# Patient Record
Sex: Female | Born: 1945
Health system: Southern US, Community
[De-identification: ages and names within clinical notes are randomized; demographics above are authoritative.]

## PROBLEM LIST (undated history)

## (undated) DIAGNOSIS — E079 Disorder of thyroid, unspecified: Secondary | ICD-10-CM

## (undated) DIAGNOSIS — I10 Essential (primary) hypertension: Secondary | ICD-10-CM

## (undated) DIAGNOSIS — J309 Allergic rhinitis, unspecified: Secondary | ICD-10-CM

## (undated) DIAGNOSIS — K219 Gastro-esophageal reflux disease without esophagitis: Secondary | ICD-10-CM

## (undated) DIAGNOSIS — M1990 Unspecified osteoarthritis, unspecified site: Secondary | ICD-10-CM

## (undated) DIAGNOSIS — K449 Diaphragmatic hernia without obstruction or gangrene: Secondary | ICD-10-CM

## (undated) DIAGNOSIS — M810 Age-related osteoporosis without current pathological fracture: Secondary | ICD-10-CM

## (undated) DIAGNOSIS — D219 Benign neoplasm of connective and other soft tissue, unspecified: Secondary | ICD-10-CM

## (undated) DIAGNOSIS — I447 Left bundle-branch block, unspecified: Secondary | ICD-10-CM

## (undated) DIAGNOSIS — E559 Vitamin D deficiency, unspecified: Secondary | ICD-10-CM

## (undated) DIAGNOSIS — R7303 Prediabetes: Secondary | ICD-10-CM

## (undated) DIAGNOSIS — E7800 Pure hypercholesterolemia, unspecified: Secondary | ICD-10-CM

## (undated) HISTORY — DX: Allergic rhinitis, unspecified: J30.9

## (undated) HISTORY — DX: Vitamin D deficiency, unspecified: E55.9

## (undated) HISTORY — DX: Disorder of thyroid, unspecified: E07.9

## (undated) HISTORY — PX: FRACTURE SURGERY: SHX138

## (undated) HISTORY — DX: Age-related osteoporosis without current pathological fracture: M81.0

## (undated) HISTORY — PX: APPENDECTOMY: SHX54

---

## 2015-05-28 DIAGNOSIS — E119 Type 2 diabetes mellitus without complications: Secondary | ICD-10-CM

## 2015-05-28 HISTORY — PX: COLONOSCOPY: SHX174

## 2015-05-28 HISTORY — PX: WRIST FRACTURE SURGERY: SHX121

## 2015-05-28 HISTORY — DX: Type 2 diabetes mellitus without complications: E11.9

## 2015-05-31 DIAGNOSIS — Z01419 Encounter for gynecological examination (general) (routine) without abnormal findings: Secondary | ICD-10-CM | POA: Diagnosis not present

## 2015-05-31 DIAGNOSIS — Z23 Encounter for immunization: Secondary | ICD-10-CM | POA: Diagnosis not present

## 2015-05-31 DIAGNOSIS — Z1151 Encounter for screening for human papillomavirus (HPV): Secondary | ICD-10-CM | POA: Diagnosis not present

## 2015-05-31 DIAGNOSIS — Z0001 Encounter for general adult medical examination with abnormal findings: Secondary | ICD-10-CM | POA: Diagnosis not present

## 2015-05-31 DIAGNOSIS — E785 Hyperlipidemia, unspecified: Secondary | ICD-10-CM | POA: Diagnosis not present

## 2015-05-31 DIAGNOSIS — R7309 Other abnormal glucose: Secondary | ICD-10-CM | POA: Diagnosis not present

## 2015-05-31 DIAGNOSIS — I10 Essential (primary) hypertension: Secondary | ICD-10-CM | POA: Diagnosis not present

## 2015-06-06 DIAGNOSIS — S59201A Unspecified physeal fracture of lower end of radius, right arm, initial encounter for closed fracture: Secondary | ICD-10-CM | POA: Diagnosis not present

## 2015-06-06 DIAGNOSIS — S61511A Laceration without foreign body of right wrist, initial encounter: Secondary | ICD-10-CM | POA: Diagnosis not present

## 2015-06-06 DIAGNOSIS — F17200 Nicotine dependence, unspecified, uncomplicated: Secondary | ICD-10-CM | POA: Diagnosis not present

## 2015-06-06 DIAGNOSIS — S52501A Unspecified fracture of the lower end of right radius, initial encounter for closed fracture: Secondary | ICD-10-CM | POA: Diagnosis not present

## 2015-06-06 DIAGNOSIS — S52691A Other fracture of lower end of right ulna, initial encounter for closed fracture: Secondary | ICD-10-CM | POA: Diagnosis not present

## 2015-06-06 DIAGNOSIS — S59001A Unspecified physeal fracture of lower end of ulna, right arm, initial encounter for closed fracture: Secondary | ICD-10-CM | POA: Diagnosis not present

## 2015-06-06 DIAGNOSIS — Z23 Encounter for immunization: Secondary | ICD-10-CM | POA: Diagnosis not present

## 2015-06-06 DIAGNOSIS — E559 Vitamin D deficiency, unspecified: Secondary | ICD-10-CM | POA: Diagnosis not present

## 2015-06-06 DIAGNOSIS — E785 Hyperlipidemia, unspecified: Secondary | ICD-10-CM | POA: Diagnosis not present

## 2015-06-06 DIAGNOSIS — I10 Essential (primary) hypertension: Secondary | ICD-10-CM | POA: Diagnosis not present

## 2015-06-06 DIAGNOSIS — S52591A Other fractures of lower end of right radius, initial encounter for closed fracture: Secondary | ICD-10-CM | POA: Diagnosis not present

## 2015-06-06 DIAGNOSIS — M79641 Pain in right hand: Secondary | ICD-10-CM | POA: Diagnosis not present

## 2015-06-08 DIAGNOSIS — S6991XA Unspecified injury of right wrist, hand and finger(s), initial encounter: Secondary | ICD-10-CM | POA: Diagnosis not present

## 2015-06-13 DIAGNOSIS — Z91040 Latex allergy status: Secondary | ICD-10-CM | POA: Diagnosis not present

## 2015-06-13 DIAGNOSIS — S52571A Other intraarticular fracture of lower end of right radius, initial encounter for closed fracture: Secondary | ICD-10-CM | POA: Diagnosis not present

## 2015-06-13 DIAGNOSIS — S52531A Colles' fracture of right radius, initial encounter for closed fracture: Secondary | ICD-10-CM | POA: Diagnosis not present

## 2015-06-13 DIAGNOSIS — Z6830 Body mass index (BMI) 30.0-30.9, adult: Secondary | ICD-10-CM | POA: Diagnosis not present

## 2015-06-13 DIAGNOSIS — E079 Disorder of thyroid, unspecified: Secondary | ICD-10-CM | POA: Diagnosis not present

## 2015-06-13 DIAGNOSIS — I10 Essential (primary) hypertension: Secondary | ICD-10-CM | POA: Diagnosis not present

## 2015-06-13 DIAGNOSIS — Z79899 Other long term (current) drug therapy: Secondary | ICD-10-CM | POA: Diagnosis not present

## 2015-06-13 DIAGNOSIS — S52261A Displaced segmental fracture of shaft of ulna, right arm, initial encounter for closed fracture: Secondary | ICD-10-CM | POA: Diagnosis not present

## 2015-06-13 DIAGNOSIS — M85831 Other specified disorders of bone density and structure, right forearm: Secondary | ICD-10-CM | POA: Diagnosis not present

## 2015-06-13 DIAGNOSIS — S52501A Unspecified fracture of the lower end of right radius, initial encounter for closed fracture: Secondary | ICD-10-CM | POA: Diagnosis not present

## 2015-06-13 DIAGNOSIS — F17200 Nicotine dependence, unspecified, uncomplicated: Secondary | ICD-10-CM | POA: Diagnosis not present

## 2015-06-13 DIAGNOSIS — E669 Obesity, unspecified: Secondary | ICD-10-CM | POA: Diagnosis not present

## 2015-06-13 DIAGNOSIS — S52601A Unspecified fracture of lower end of right ulna, initial encounter for closed fracture: Secondary | ICD-10-CM | POA: Diagnosis not present

## 2015-06-13 DIAGNOSIS — S52531D Colles' fracture of right radius, subsequent encounter for closed fracture with routine healing: Secondary | ICD-10-CM | POA: Insufficient documentation

## 2015-06-13 DIAGNOSIS — G8918 Other acute postprocedural pain: Secondary | ICD-10-CM | POA: Diagnosis not present

## 2015-06-27 DIAGNOSIS — M25531 Pain in right wrist: Secondary | ICD-10-CM | POA: Diagnosis not present

## 2015-06-27 DIAGNOSIS — S62101S Fracture of unspecified carpal bone, right wrist, sequela: Secondary | ICD-10-CM | POA: Diagnosis not present

## 2015-06-27 DIAGNOSIS — S6991XA Unspecified injury of right wrist, hand and finger(s), initial encounter: Secondary | ICD-10-CM | POA: Diagnosis not present

## 2015-07-11 DIAGNOSIS — S6991XA Unspecified injury of right wrist, hand and finger(s), initial encounter: Secondary | ICD-10-CM | POA: Diagnosis not present

## 2015-07-11 DIAGNOSIS — M25531 Pain in right wrist: Secondary | ICD-10-CM | POA: Diagnosis not present

## 2015-07-11 DIAGNOSIS — S62101S Fracture of unspecified carpal bone, right wrist, sequela: Secondary | ICD-10-CM | POA: Diagnosis not present

## 2015-07-18 DIAGNOSIS — S62101S Fracture of unspecified carpal bone, right wrist, sequela: Secondary | ICD-10-CM | POA: Diagnosis not present

## 2015-07-18 DIAGNOSIS — M25531 Pain in right wrist: Secondary | ICD-10-CM | POA: Diagnosis not present

## 2015-07-18 DIAGNOSIS — S6991XA Unspecified injury of right wrist, hand and finger(s), initial encounter: Secondary | ICD-10-CM | POA: Diagnosis not present

## 2015-07-25 DIAGNOSIS — M25531 Pain in right wrist: Secondary | ICD-10-CM | POA: Diagnosis not present

## 2015-07-25 DIAGNOSIS — S62101D Fracture of unspecified carpal bone, right wrist, subsequent encounter for fracture with routine healing: Secondary | ICD-10-CM | POA: Diagnosis not present

## 2015-07-25 DIAGNOSIS — S6991XA Unspecified injury of right wrist, hand and finger(s), initial encounter: Secondary | ICD-10-CM | POA: Diagnosis not present

## 2015-08-03 DIAGNOSIS — S6991XA Unspecified injury of right wrist, hand and finger(s), initial encounter: Secondary | ICD-10-CM | POA: Diagnosis not present

## 2015-08-03 DIAGNOSIS — S62101S Fracture of unspecified carpal bone, right wrist, sequela: Secondary | ICD-10-CM | POA: Diagnosis not present

## 2015-08-03 DIAGNOSIS — M25531 Pain in right wrist: Secondary | ICD-10-CM | POA: Diagnosis not present

## 2015-08-22 DIAGNOSIS — S62101S Fracture of unspecified carpal bone, right wrist, sequela: Secondary | ICD-10-CM | POA: Diagnosis not present

## 2015-08-22 DIAGNOSIS — M25531 Pain in right wrist: Secondary | ICD-10-CM | POA: Diagnosis not present

## 2015-08-22 DIAGNOSIS — S6991XA Unspecified injury of right wrist, hand and finger(s), initial encounter: Secondary | ICD-10-CM | POA: Diagnosis not present

## 2015-08-31 DIAGNOSIS — S6991XA Unspecified injury of right wrist, hand and finger(s), initial encounter: Secondary | ICD-10-CM | POA: Diagnosis not present

## 2015-09-05 DIAGNOSIS — S6991XA Unspecified injury of right wrist, hand and finger(s), initial encounter: Secondary | ICD-10-CM | POA: Diagnosis not present

## 2015-09-05 DIAGNOSIS — S62101S Fracture of unspecified carpal bone, right wrist, sequela: Secondary | ICD-10-CM | POA: Diagnosis not present

## 2015-09-05 DIAGNOSIS — S52531A Colles' fracture of right radius, initial encounter for closed fracture: Secondary | ICD-10-CM | POA: Diagnosis not present

## 2015-09-14 DIAGNOSIS — S62101S Fracture of unspecified carpal bone, right wrist, sequela: Secondary | ICD-10-CM | POA: Diagnosis not present

## 2015-09-14 DIAGNOSIS — S6991XA Unspecified injury of right wrist, hand and finger(s), initial encounter: Secondary | ICD-10-CM | POA: Diagnosis not present

## 2015-09-15 DIAGNOSIS — M25631 Stiffness of right wrist, not elsewhere classified: Secondary | ICD-10-CM | POA: Diagnosis not present

## 2015-09-15 DIAGNOSIS — M25531 Pain in right wrist: Secondary | ICD-10-CM | POA: Diagnosis not present

## 2015-09-15 DIAGNOSIS — S52531D Colles' fracture of right radius, subsequent encounter for closed fracture with routine healing: Secondary | ICD-10-CM | POA: Diagnosis not present

## 2015-09-15 DIAGNOSIS — M25641 Stiffness of right hand, not elsewhere classified: Secondary | ICD-10-CM | POA: Diagnosis not present

## 2015-09-21 DIAGNOSIS — M25641 Stiffness of right hand, not elsewhere classified: Secondary | ICD-10-CM | POA: Diagnosis not present

## 2015-09-21 DIAGNOSIS — M25531 Pain in right wrist: Secondary | ICD-10-CM | POA: Diagnosis not present

## 2015-09-21 DIAGNOSIS — M25631 Stiffness of right wrist, not elsewhere classified: Secondary | ICD-10-CM | POA: Diagnosis not present

## 2015-09-21 DIAGNOSIS — S6991XA Unspecified injury of right wrist, hand and finger(s), initial encounter: Secondary | ICD-10-CM | POA: Diagnosis not present

## 2015-09-28 DIAGNOSIS — M25631 Stiffness of right wrist, not elsewhere classified: Secondary | ICD-10-CM | POA: Diagnosis not present

## 2015-09-28 DIAGNOSIS — S52531D Colles' fracture of right radius, subsequent encounter for closed fracture with routine healing: Secondary | ICD-10-CM | POA: Diagnosis not present

## 2015-09-28 DIAGNOSIS — M25531 Pain in right wrist: Secondary | ICD-10-CM | POA: Diagnosis not present

## 2015-09-28 DIAGNOSIS — M25641 Stiffness of right hand, not elsewhere classified: Secondary | ICD-10-CM | POA: Diagnosis not present

## 2015-10-05 DIAGNOSIS — S62101S Fracture of unspecified carpal bone, right wrist, sequela: Secondary | ICD-10-CM | POA: Diagnosis not present

## 2015-10-05 DIAGNOSIS — S6991XA Unspecified injury of right wrist, hand and finger(s), initial encounter: Secondary | ICD-10-CM | POA: Diagnosis not present

## 2015-10-05 DIAGNOSIS — M25641 Stiffness of right hand, not elsewhere classified: Secondary | ICD-10-CM | POA: Diagnosis not present

## 2015-10-05 DIAGNOSIS — M25631 Stiffness of right wrist, not elsewhere classified: Secondary | ICD-10-CM | POA: Diagnosis not present

## 2015-10-12 DIAGNOSIS — S62101S Fracture of unspecified carpal bone, right wrist, sequela: Secondary | ICD-10-CM | POA: Diagnosis not present

## 2015-10-12 DIAGNOSIS — M25641 Stiffness of right hand, not elsewhere classified: Secondary | ICD-10-CM | POA: Diagnosis not present

## 2015-10-12 DIAGNOSIS — M25631 Stiffness of right wrist, not elsewhere classified: Secondary | ICD-10-CM | POA: Diagnosis not present

## 2015-10-19 DIAGNOSIS — M25631 Stiffness of right wrist, not elsewhere classified: Secondary | ICD-10-CM | POA: Diagnosis not present

## 2015-10-19 DIAGNOSIS — M25531 Pain in right wrist: Secondary | ICD-10-CM | POA: Diagnosis not present

## 2015-10-19 DIAGNOSIS — S6991XA Unspecified injury of right wrist, hand and finger(s), initial encounter: Secondary | ICD-10-CM | POA: Diagnosis not present

## 2015-10-19 DIAGNOSIS — S52531D Colles' fracture of right radius, subsequent encounter for closed fracture with routine healing: Secondary | ICD-10-CM | POA: Diagnosis not present

## 2015-10-24 DIAGNOSIS — Z1231 Encounter for screening mammogram for malignant neoplasm of breast: Secondary | ICD-10-CM | POA: Diagnosis not present

## 2015-10-25 DIAGNOSIS — Z780 Asymptomatic menopausal state: Secondary | ICD-10-CM | POA: Diagnosis not present

## 2015-10-26 DIAGNOSIS — Z01419 Encounter for gynecological examination (general) (routine) without abnormal findings: Secondary | ICD-10-CM | POA: Diagnosis not present

## 2015-11-07 DIAGNOSIS — M25631 Stiffness of right wrist, not elsewhere classified: Secondary | ICD-10-CM | POA: Diagnosis not present

## 2015-11-07 DIAGNOSIS — S62101S Fracture of unspecified carpal bone, right wrist, sequela: Secondary | ICD-10-CM | POA: Diagnosis not present

## 2015-11-07 DIAGNOSIS — S52531D Colles' fracture of right radius, subsequent encounter for closed fracture with routine healing: Secondary | ICD-10-CM | POA: Diagnosis not present

## 2015-11-07 DIAGNOSIS — M25531 Pain in right wrist: Secondary | ICD-10-CM | POA: Diagnosis not present

## 2015-11-09 DIAGNOSIS — K2960 Other gastritis without bleeding: Secondary | ICD-10-CM | POA: Diagnosis not present

## 2015-11-09 DIAGNOSIS — K635 Polyp of colon: Secondary | ICD-10-CM | POA: Diagnosis not present

## 2015-11-09 DIAGNOSIS — Z09 Encounter for follow-up examination after completed treatment for conditions other than malignant neoplasm: Secondary | ICD-10-CM | POA: Diagnosis not present

## 2015-11-09 DIAGNOSIS — K210 Gastro-esophageal reflux disease with esophagitis: Secondary | ICD-10-CM | POA: Diagnosis not present

## 2015-11-09 DIAGNOSIS — Z86010 Personal history of colonic polyps: Secondary | ICD-10-CM | POA: Diagnosis not present

## 2015-11-09 DIAGNOSIS — K219 Gastro-esophageal reflux disease without esophagitis: Secondary | ICD-10-CM | POA: Diagnosis not present

## 2015-11-09 DIAGNOSIS — K259 Gastric ulcer, unspecified as acute or chronic, without hemorrhage or perforation: Secondary | ICD-10-CM | POA: Diagnosis not present

## 2015-11-09 DIAGNOSIS — I10 Essential (primary) hypertension: Secondary | ICD-10-CM | POA: Diagnosis not present

## 2015-11-09 DIAGNOSIS — D123 Benign neoplasm of transverse colon: Secondary | ICD-10-CM | POA: Diagnosis not present

## 2015-11-09 DIAGNOSIS — D122 Benign neoplasm of ascending colon: Secondary | ICD-10-CM | POA: Diagnosis not present

## 2015-11-14 DIAGNOSIS — M25631 Stiffness of right wrist, not elsewhere classified: Secondary | ICD-10-CM | POA: Diagnosis not present

## 2015-11-14 DIAGNOSIS — M25531 Pain in right wrist: Secondary | ICD-10-CM | POA: Diagnosis not present

## 2015-11-14 DIAGNOSIS — F17200 Nicotine dependence, unspecified, uncomplicated: Secondary | ICD-10-CM | POA: Diagnosis not present

## 2015-11-14 DIAGNOSIS — S52531A Colles' fracture of right radius, initial encounter for closed fracture: Secondary | ICD-10-CM | POA: Diagnosis not present

## 2015-11-14 DIAGNOSIS — M25641 Stiffness of right hand, not elsewhere classified: Secondary | ICD-10-CM | POA: Diagnosis not present

## 2015-11-14 DIAGNOSIS — S62101S Fracture of unspecified carpal bone, right wrist, sequela: Secondary | ICD-10-CM | POA: Diagnosis not present

## 2015-11-22 DIAGNOSIS — R1031 Right lower quadrant pain: Secondary | ICD-10-CM | POA: Diagnosis not present

## 2015-11-23 DIAGNOSIS — M25631 Stiffness of right wrist, not elsewhere classified: Secondary | ICD-10-CM | POA: Diagnosis not present

## 2015-11-23 DIAGNOSIS — M25531 Pain in right wrist: Secondary | ICD-10-CM | POA: Diagnosis not present

## 2015-11-23 DIAGNOSIS — S52531D Colles' fracture of right radius, subsequent encounter for closed fracture with routine healing: Secondary | ICD-10-CM | POA: Diagnosis not present

## 2015-11-23 DIAGNOSIS — M25641 Stiffness of right hand, not elsewhere classified: Secondary | ICD-10-CM | POA: Diagnosis not present

## 2015-11-29 DIAGNOSIS — S62101S Fracture of unspecified carpal bone, right wrist, sequela: Secondary | ICD-10-CM | POA: Diagnosis not present

## 2015-11-29 DIAGNOSIS — M25631 Stiffness of right wrist, not elsewhere classified: Secondary | ICD-10-CM | POA: Diagnosis not present

## 2015-11-30 DIAGNOSIS — E039 Hypothyroidism, unspecified: Secondary | ICD-10-CM | POA: Diagnosis not present

## 2015-11-30 DIAGNOSIS — E785 Hyperlipidemia, unspecified: Secondary | ICD-10-CM | POA: Diagnosis not present

## 2015-11-30 DIAGNOSIS — I10 Essential (primary) hypertension: Secondary | ICD-10-CM | POA: Diagnosis not present

## 2015-11-30 DIAGNOSIS — M5430 Sciatica, unspecified side: Secondary | ICD-10-CM | POA: Diagnosis not present

## 2015-11-30 DIAGNOSIS — K219 Gastro-esophageal reflux disease without esophagitis: Secondary | ICD-10-CM | POA: Diagnosis not present

## 2015-12-07 DIAGNOSIS — R799 Abnormal finding of blood chemistry, unspecified: Secondary | ICD-10-CM | POA: Diagnosis not present

## 2015-12-08 DIAGNOSIS — S62101S Fracture of unspecified carpal bone, right wrist, sequela: Secondary | ICD-10-CM | POA: Diagnosis not present

## 2015-12-08 DIAGNOSIS — M25631 Stiffness of right wrist, not elsewhere classified: Secondary | ICD-10-CM | POA: Diagnosis not present

## 2015-12-13 DIAGNOSIS — M545 Low back pain: Secondary | ICD-10-CM | POA: Diagnosis not present

## 2015-12-13 DIAGNOSIS — M419 Scoliosis, unspecified: Secondary | ICD-10-CM | POA: Diagnosis not present

## 2015-12-14 DIAGNOSIS — M25631 Stiffness of right wrist, not elsewhere classified: Secondary | ICD-10-CM | POA: Diagnosis not present

## 2015-12-14 DIAGNOSIS — M25531 Pain in right wrist: Secondary | ICD-10-CM | POA: Diagnosis not present

## 2015-12-14 DIAGNOSIS — S62101S Fracture of unspecified carpal bone, right wrist, sequela: Secondary | ICD-10-CM | POA: Diagnosis not present

## 2015-12-15 DIAGNOSIS — I10 Essential (primary) hypertension: Secondary | ICD-10-CM | POA: Diagnosis not present

## 2015-12-15 DIAGNOSIS — R7303 Prediabetes: Secondary | ICD-10-CM | POA: Diagnosis not present

## 2015-12-15 DIAGNOSIS — R799 Abnormal finding of blood chemistry, unspecified: Secondary | ICD-10-CM | POA: Diagnosis not present

## 2015-12-15 DIAGNOSIS — R7989 Other specified abnormal findings of blood chemistry: Secondary | ICD-10-CM | POA: Diagnosis not present

## 2015-12-27 DIAGNOSIS — S52531A Colles' fracture of right radius, initial encounter for closed fracture: Secondary | ICD-10-CM | POA: Diagnosis not present

## 2015-12-28 DIAGNOSIS — S52531D Colles' fracture of right radius, subsequent encounter for closed fracture with routine healing: Secondary | ICD-10-CM | POA: Diagnosis not present

## 2015-12-28 DIAGNOSIS — S6991XA Unspecified injury of right wrist, hand and finger(s), initial encounter: Secondary | ICD-10-CM | POA: Diagnosis not present

## 2015-12-28 DIAGNOSIS — M25531 Pain in right wrist: Secondary | ICD-10-CM | POA: Diagnosis not present

## 2015-12-28 DIAGNOSIS — M25631 Stiffness of right wrist, not elsewhere classified: Secondary | ICD-10-CM | POA: Diagnosis not present

## 2016-03-14 DIAGNOSIS — R7303 Prediabetes: Secondary | ICD-10-CM | POA: Diagnosis not present

## 2016-03-14 DIAGNOSIS — I10 Essential (primary) hypertension: Secondary | ICD-10-CM | POA: Diagnosis not present

## 2016-03-14 DIAGNOSIS — E785 Hyperlipidemia, unspecified: Secondary | ICD-10-CM | POA: Diagnosis not present

## 2016-03-14 DIAGNOSIS — R799 Abnormal finding of blood chemistry, unspecified: Secondary | ICD-10-CM | POA: Diagnosis not present

## 2016-03-14 DIAGNOSIS — E039 Hypothyroidism, unspecified: Secondary | ICD-10-CM | POA: Diagnosis not present

## 2016-03-14 DIAGNOSIS — Z23 Encounter for immunization: Secondary | ICD-10-CM | POA: Diagnosis not present

## 2016-09-02 ENCOUNTER — Ambulatory Visit: Payer: Self-pay | Admitting: Family Medicine

## 2016-09-02 NOTE — Progress Notes (Deleted)
   Subjective:   Patient ID: Susan Keith    DOB: 1945-05-29, 71 y.o. female   MRN: [id number]  CC: establish care  HPI: Susan Keith is a 71 y.o. female who presents to clinic today to establish care with PCP.  Moved from [place] Previous PCP: Dr. Salome Holmes.  Records fax  Work:  Lives with:   Meds: Allergies:   PMH: Social: L:ives with ***.  Ambulates with ***.  Tobacco use: ***.  Alcohol use: ***.  Drug use: ***.  Surg Hx:   HM: -colonoscopy 2017- -Mammogram 2017- -Pap 2017 -  -Bone density 2017 - -  ROS: See HPI for pertinent ROS.  [place]: Pertinent past medical, surgical, family, and social history were reviewed and updated as appropriate. Smoking status reviewed.  Medications reviewed. No current outpatient prescriptions on file.   No current facility-administered medications for this visit.     Objective:   There were no vitals taken for this visit. Vitals and nursing note reviewed.  General: well nourished, well developed, in no acute distress with non-toxic appearance HEENT: normocephalic, atraumatic, moist mucous membranes Neck: supple, non-tender without lymphadenopathy CV: regular rate and rhythm without murmurs rubs or gallops Lungs: clear to auscultation bilaterally with normal work of breathing Abdomen: soft, non-tender, no masses or organomegaly palpable, normoactive bowel sounds Skin: warm, dry, no rashes or lesions, cap refill < 2 seconds Extremities: warm and well perfused, normal tone  Assessment & Plan:   No problem-specific Assessment & Plan notes found for this encounter.  No orders of the defined types were placed in this encounter.  No orders of the defined types were placed in this encounter.   Lovenia Kim, MD [place], PGY-1 09/02/2016 8:35 AM

## 2016-09-06 ENCOUNTER — Encounter: Payer: Self-pay | Admitting: Family Medicine

## 2016-09-06 ENCOUNTER — Ambulatory Visit (INDEPENDENT_AMBULATORY_CARE_PROVIDER_SITE_OTHER): Payer: Medicare Other | Admitting: Family Medicine

## 2016-09-06 ENCOUNTER — Ambulatory Visit (HOSPITAL_COMMUNITY)
Admission: RE | Admit: 2016-09-06 | Discharge: 2016-09-06 | Disposition: A | Discharge status: Home / Self Care | Payer: Medicare Other | Source: Ambulatory Visit | Attending: Family Medicine | Admitting: Family Medicine

## 2016-09-06 ENCOUNTER — Ambulatory Visit: Payer: Self-pay | Admitting: Family Medicine

## 2016-09-06 VITALS — BP 120/80 | HR 82 | Temp 98.3°F | Ht 60.0 in | Wt 152.0 lb

## 2016-09-06 DIAGNOSIS — M4185 Other forms of scoliosis, thoracolumbar region: Secondary | ICD-10-CM | POA: Insufficient documentation

## 2016-09-06 DIAGNOSIS — G8929 Other chronic pain: Secondary | ICD-10-CM | POA: Diagnosis not present

## 2016-09-06 DIAGNOSIS — I700 Atherosclerosis of aorta: Secondary | ICD-10-CM | POA: Insufficient documentation

## 2016-09-06 DIAGNOSIS — M545 Low back pain: Secondary | ICD-10-CM | POA: Diagnosis not present

## 2016-09-06 DIAGNOSIS — M5136 Other intervertebral disc degeneration, lumbar region: Secondary | ICD-10-CM | POA: Diagnosis not present

## 2016-09-06 MED ORDER — VERAPAMIL HCL ER 180 MG PO CP24: 180.0000 mg | ORAL_CAPSULE | Freq: Every day | ORAL | 2 refills | Status: DC

## 2016-09-06 MED ORDER — METFORMIN HCL ER 750 MG PO TB24: 750.0000 mg | ORAL_TABLET | Freq: Every day | ORAL | 1 refills | Status: DC

## 2016-09-06 MED ORDER — LISINOPRIL 20 MG PO TABS: 20.0000 mg | ORAL_TABLET | Freq: Every day | ORAL | 3 refills | Status: DC

## 2016-09-06 MED ORDER — ATORVASTATIN CALCIUM 20 MG PO TABS: 20.0000 mg | ORAL_TABLET | Freq: Every day | ORAL | 3 refills | Status: DC

## 2016-09-06 MED ORDER — OMEPRAZOLE 40 MG PO CPDR: 40.0000 mg | DELAYED_RELEASE_CAPSULE | Freq: Every day | ORAL | 3 refills | Status: DC

## 2016-09-06 MED ORDER — HYDROCHLOROTHIAZIDE 25 MG PO TABS: 25.0000 mg | ORAL_TABLET | Freq: Every day | ORAL | 3 refills | Status: DC

## 2016-09-06 NOTE — Progress Notes (Signed)
Subjective:   Patient ID: Susan Keith    DOB: Nov 11, 1945, 71 y.o. female   MRN: [id number]  HPI: Susan Keith is a 71 y.o. female who presents to clinic today to establish care with PCP.  Patient is present with her daughter.  She speaks [place] and daughter is interpreting.  Has moved from [place] to [place] in 2011. Was living in [place] with husband until July of last year.  Dr. Salome Holmes is prior PCP.  Sees a cardiologist for HTN.  Has 2 children.    R hip/back pain Patient fell and broke her R wrist in Jan 2017.  Had surgery and wrist has healed well.  However has had R hip/back pain ever since the fall that lately has been worsening.  Reports she landed straight on her bottom.  Few weeks later started feeling the pain. Reports sometimes has shooting pain down R leg to inside of thigh.  Not present when she sits still.  Has a hard time finding a good position to sleep.  Has not taken any pain medications.    Denies numbness/tingling.    PMH: fell and broke R wrist, January 2017. HTN diagnosed ~30s.  Pre-diabetes.  Last A1c in October 2017 - 5.9-6.0., GERD, HLD,  Fhx: father with T2DM. No fhx of cancers.   Surgical Hx:   R wrist, R foot bunionectomy. L foot has bunion but was not operated on.  Appendix removed 1991.  Social: smokes 5-6 cigarettes a day, x last age 5, has not tried to quit smoking.  Alcohol:  No alcohol.  Drugs: none  Health Maintenance:  -colonoscopy - last May 2017, found few polyps and removed them.  Repeat in 3 years.   -Pap last May 2017.  Normal - never had any abnormal ones.    -Mammogram May 2017 - normal -DEXA scan - bone density - May 2017.  Low density.   ROS: Denies fevers, chills, nausea, vomiting, diarrhea, abdominal pain, shortness of breath.    Medications reviewed.  Objective:   BP 120/80   Pulse 82   Temp 98.3 F (36.8 C) (Oral)   Ht 5' (1.524 m)   Wt 152 lb (68.9 kg)   SpO2 98%   BMI 29.69 kg/m  Vitals and nursing note  reviewed.  General: 71 yo F, well nourished, well developed, in no acute distress HEENT:NCAT, MMM, o/p clear  Neck: supple CV: RRR no MRG  Lungs: CTA B/L with comfortable work of breathing Abdomen: soft, NTND, +bs  Back: no tenderness over spinous processes of lumbar back, mild TTP of R paraspinal muscles Skin: warm, dry  Extremities: No edema or tenderness present  Neuro: Aox3, no focal deficits, negative straight leg test  Assessment & Plan:   Back pain History of right sided low back pain with radiation to R leg s/p fall in January 2017.  Able to ambulate.  No limp present. No red flags on exam.   - Advised take Tylenol for pain.  Do not have recent labwork on patient but will obtain next visit to check kidney function prior to prescribing other pain med.  - Will obtain lumbar XR 4v to evaluate for compression fracture  -Follow up in 1 week   Medication refills provided.   Orders Placed This Encounter  Procedures  . DG Lumbar Spine Complete    Standing Status:   Future    Number of Occurrences:   1    Standing Expiration Date:   11/06/2017  Order Specific Question:   Reason for Exam (SYMPTOM  OR DIAGNOSIS REQUIRED)    Answer:   back pain    Order Specific Question:   Preferred imaging location?    Answer:   [facility]    Order Specific Question:   Radiology Contrast Protocol - do NOT remove file path    Answer:   \\[email or web address]\Radiant\DXFluoroContrastProtocols.pdf   Meds ordered this encounter  Medications  . atorvastatin (LIPITOR) 20 MG tablet    Sig: Take 1 tablet (20 mg total) by mouth daily.    Dispense:  30 tablet    Refill:  3  . hydrochlorothiazide (HYDRODIURIL) 25 MG tablet    Sig: Take 1 tablet (25 mg total) by mouth daily.    Dispense:  30 tablet    Refill:  3  . lisinopril (PRINIVIL,ZESTRIL) 20 MG tablet    Sig: Take 1 tablet (20 mg total) by mouth daily.    Dispense:  30 tablet    Refill:  3  . metFORMIN (GLUCOPHAGE XR) 750 MG 24 hr  tablet    Sig: Take 1 tablet (750 mg total) by mouth daily with breakfast.    Dispense:  30 tablet    Refill:  1  . omeprazole (PRILOSEC) 40 MG capsule    Sig: Take 1 capsule (40 mg total) by mouth daily.    Dispense:  30 capsule    Refill:  3  . verapamil (VERELAN PM) 180 MG 24 hr capsule    Sig: Take 1 capsule (180 mg total) by mouth daily.    Dispense:  30 capsule    Refill:  2    Lovenia Kim, MD [place], PGY-1 09/09/2016 10:35 AM

## 2016-09-06 NOTE — Patient Instructions (Signed)
It was nice meeting you today! You were seen in clinic to establish care with me as your PCP.  We reviewed your past medical history, medications and I have refilled your medications.  In addition, for your low back pain I have ordered a lumbar X-ray.  You can go to [facility] Radiology department for this and do not need an appointment.  I will follow up with the results of your imaging.  In the meantime you can take Tylenol as needed for the pain.    Lovenia Kim, MD

## 2016-09-09 DIAGNOSIS — M549 Dorsalgia, unspecified: Secondary | ICD-10-CM | POA: Insufficient documentation

## 2016-09-09 NOTE — Assessment & Plan Note (Addendum)
History of right sided low back pain with radiation to R leg s/p fall in January 2017.  Able to ambulate.  No limp present. No red flags on exam.   - Advised take Tylenol for pain.  Do not have recent labwork on patient but will obtain next visit to check kidney function prior to prescribing other pain med.  - Will obtain lumbar XR 4v to evaluate for compression fracture  -Follow up in 1 week

## 2016-09-10 ENCOUNTER — Telehealth: Payer: Self-pay | Admitting: Family Medicine

## 2016-09-10 NOTE — Telephone Encounter (Signed)
I am covering Dr. Latina Craver inbox. Could red team please call and let the patient know that her imaging did not show any fracture? Thanks

## 2016-09-11 NOTE — Telephone Encounter (Signed)
Informed patients daughter of message from MD, daughter states that at last appointment MD stated the next step would be to do a MRI, daughter asking if this can be ordered now so she can have it done by her next appointment with MD. Will forward to PCP.

## 2016-09-11 NOTE — Telephone Encounter (Signed)
I will defer that question to Dr. Reesa Chew. I would guess that the patient probably needs to be seen prior to ordering this, but please let the family know that Dr. Reesa Chew will return on Monday and can revisit this then. Dr. Reesa Chew didn't mention a plan for MRI in her note, so I will let her decide this.

## 2016-09-23 ENCOUNTER — Ambulatory Visit (HOSPITAL_COMMUNITY)
Admission: RE | Admit: 2016-09-23 | Discharge: 2016-09-23 | Disposition: A | Discharge status: Home / Self Care | Payer: Medicare Other | Source: Ambulatory Visit | Attending: Family Medicine | Admitting: Family Medicine

## 2016-09-23 ENCOUNTER — Encounter: Payer: Self-pay | Admitting: Family Medicine

## 2016-09-23 ENCOUNTER — Ambulatory Visit (INDEPENDENT_AMBULATORY_CARE_PROVIDER_SITE_OTHER): Payer: Medicare Other | Admitting: Family Medicine

## 2016-09-23 VITALS — BP 118/80 | HR 77 | Temp 97.9°F | Ht 60.0 in | Wt 155.4 lb

## 2016-09-23 DIAGNOSIS — M25551 Pain in right hip: Secondary | ICD-10-CM | POA: Insufficient documentation

## 2016-09-23 DIAGNOSIS — Z1159 Encounter for screening for other viral diseases: Secondary | ICD-10-CM

## 2016-09-23 DIAGNOSIS — M160 Bilateral primary osteoarthritis of hip: Secondary | ICD-10-CM | POA: Insufficient documentation

## 2016-09-23 DIAGNOSIS — M899 Disorder of bone, unspecified: Secondary | ICD-10-CM | POA: Insufficient documentation

## 2016-09-23 DIAGNOSIS — S79911A Unspecified injury of right hip, initial encounter: Secondary | ICD-10-CM | POA: Diagnosis not present

## 2016-09-23 DIAGNOSIS — I10 Essential (primary) hypertension: Secondary | ICD-10-CM | POA: Diagnosis not present

## 2016-09-23 DIAGNOSIS — M47816 Spondylosis without myelopathy or radiculopathy, lumbar region: Secondary | ICD-10-CM | POA: Diagnosis not present

## 2016-09-23 DIAGNOSIS — Z5181 Encounter for therapeutic drug level monitoring: Secondary | ICD-10-CM

## 2016-09-23 NOTE — Assessment & Plan Note (Addendum)
Likely 2/2 OA vs possible sprain.  -Discussed results of negative lumbar XR with patient.  Will hold off on MRI as no compression fracture or abnormalities noted on plain films and MRI not warranted.  No signs of radiculopathy and otherwise no red flags on exam.   -XR right hip ordered to evaluate -Recommend continue Tylenol for pain and NSAIDs prn for anti-inflammatory effect -gentle walking -CBC, BMET ordered and will check Cr function

## 2016-09-23 NOTE — Patient Instructions (Signed)
It was nice seeing you today! You were seen in clinic for right hip/back pain.  As we discussed, your back x-ray did not show any fractures besides some degenerative changes which is normal in the older population.  You will not need an MRI at this time however I have ordered a right hip x-ray.  Please go to the radiology department of the hospital to have this done.  You do not need an appointment.  I will follow up with the results of this.  In the meantime, you can continue to take Tylenol for the pain and anti-inflammatories as needed.  You can do some gentle walking on the hip as well.    I have ordered some bloodwork today and will let you know if anything is abnormal.    Call clinic with any questions.   Be well,  Lovenia Kim, MD

## 2016-09-23 NOTE — Progress Notes (Signed)
   Subjective:   Patient ID: Susan Keith    DOB: Jun 03, 1945, 71 y.o. female   MRN: [id number]  CC: Right hip/leg pain  HPI: Susan Keith is a 71 y.o. female who presents to clinic today for R leg pain.  Accompanied by her husband. [place] Video interpreter ID# [id number] Dan [place]).    R hip/back/leg pain  Patient with history of fall in January 2017 during which she broke her right wrist. Has had worsening R hip/back pain ever since 1 month after the fall which causes leg pain.  Sometimes shooting pain down R leg to inside of thigh.  Only present with walking and she has a slight limp when weight bearing.  Is uncomfortable when sleeping and can't find a comfortable position.   Has been taking Tylenol as needed for pain.  Denies numbness/tingling.  At previous visit, lumbar XR was obtained to evaluate for compression fracture and was negative for acute process.  Some mild degenerative changes shown.   Wants to know if she needs to have an MRI.      ROS: No fevers, chills, abdominal pain, numbness, tingling.   [place]: Pertinent past medical, surgical, family, and social history were reviewed and updated as appropriate. Smoking status reviewed. Medications reviewed.  Objective:   BP 118/80 (BP Location: Left Arm, Patient Position: Sitting, Cuff Size: Normal)   Pulse 77   Temp 97.9 F (36.6 C) (Oral)   Ht 5' (1.524 m)   Wt 155 lb 6.4 oz (70.5 kg)   SpO2 96%   BMI 30.35 kg/m  Vitals and nursing note reviewed.  General: pleasant 71 yo F, NAD NAD  HEENT: normal  Neck: supple CV: RRR no MRG  Lungs: CTA B/L with normal WOB  Skin: warm, dry, no bruising over right hip Extremities: Right hip and leg with no gross deformities noted, no TTP over greater trochanter, negative straight leg test and 5/5 strength bilaterally with normal sensation  Assessment & Plan:   Right hip pain Likely 2/2 OA vs possible sprain.  -Discussed results of negative lumbar XR with patient.  Will hold off on MRI as  no compression fracture or abnormalities noted on plain films and MRI not warranted.  No signs of radiculopathy and otherwise no red flags on exam.   -XR right hip ordered to evaluate -Recommend continue Tylenol for pain and NSAIDs prn for anti-inflammatory effect -gentle walking -CBC, BMET ordered and will check Cr function   Health Maintenance: -Hepatitis C screening  Orders Placed This Encounter  Procedures  . DG HIP UNILAT WITH PELVIS 2-3 VIEWS RIGHT    Standing Status:   Future    Number of Occurrences:   1    Standing Expiration Date:   11/23/2017    Order Specific Question:   Reason for Exam (SYMPTOM  OR DIAGNOSIS REQUIRED)    Answer:   R hip pain    Order Specific Question:   Preferred imaging location?    Answer:   [facility]    Order Specific Question:   Radiology Contrast Protocol - do NOT remove file path    Answer:   \\[email or web address]\Radiant\DXFluoroContrastProtocols.pdf  . CBC  . Basic Metabolic Panel  . Hepatitis C antibody   No orders of the defined types were placed in this encounter.  Follow up: 1 month  Lovenia Kim, MD [place], PGY-1 09/23/2016 6:42 PM

## 2016-09-24 LAB — CBC
HEMATOCRIT: 36.5 % (ref 34.0–46.6)
HEMOGLOBIN: 11.9 g/dL (ref 11.1–15.9)
MCH: 28.7 pg (ref 26.6–33.0)
MCHC: 32.6 g/dL (ref 31.5–35.7)
MCV: 88 fL (ref 79–97)
Platelets: 259 10*3/uL (ref 150–379)
RBC: 4.15 x10E6/uL (ref 3.77–5.28)
RDW: 13.4 % (ref 12.3–15.4)
WBC: 6.8 10*3/uL (ref 3.4–10.8)

## 2016-09-24 LAB — BASIC METABOLIC PANEL
BUN / CREAT RATIO: 34 — AB (ref 12–28)
BUN: 41 mg/dL — ABNORMAL HIGH (ref 8–27)
CO2: 25 mmol/L (ref 18–29)
CREATININE: 1.21 mg/dL — AB (ref 0.57–1.00)
Calcium: 9.8 mg/dL (ref 8.7–10.3)
Chloride: 102 mmol/L (ref 96–106)
GFR calc Af Amer: 53 mL/min/{1.73_m2} — ABNORMAL LOW (ref >59)
GFR, EST NON AFRICAN AMERICAN: 46 mL/min/{1.73_m2} — AB (ref >59)
Glucose: 102 mg/dL — ABNORMAL HIGH (ref 65–99)
Potassium: 4.8 mmol/L (ref 3.5–5.2)
SODIUM: 143 mmol/L (ref 134–144)

## 2016-09-24 LAB — HEPATITIS C ANTIBODY: Hep C Virus Ab: <0.1 s/co ratio (ref 0.0–0.9)

## 2016-09-30 ENCOUNTER — Telehealth: Payer: Self-pay | Admitting: Family Medicine

## 2016-09-30 NOTE — Telephone Encounter (Signed)
Daughter would like to get pt tested for hep c, please call and let her know when pt can come to the lab to do so. ep

## 2016-10-01 NOTE — Telephone Encounter (Signed)
That is something that can be done at her next visit.  She can do it when she follows up with me, unless she is emergently concerned her mother has hepatitis C.

## 2016-10-07 NOTE — Telephone Encounter (Signed)
Attempted to contact pt daughter, but phone only rings 2 then disconnects. Will try again later. Deseree Kennon Holter, CMA

## 2016-10-08 ENCOUNTER — Telehealth: Payer: Self-pay | Admitting: Family Medicine

## 2016-10-08 NOTE — Telephone Encounter (Signed)
Daughter states they have not heard back about pt's x-rays. Pt's head pain is getting worse, daughter is concerned. Daughter declined appointment, wants PCP to call her ASAP. ep

## 2016-10-08 NOTE — Telephone Encounter (Signed)
Will forward to PCP.  Martin, Tamika L, RN  

## 2016-10-10 NOTE — Telephone Encounter (Signed)
Daughter does not want to schedule another appointment, pt is not better. Daughter wants pt to have a MRI. ep

## 2016-10-10 NOTE — Telephone Encounter (Signed)
Called patient's daughter but no response.  Left VM.  Her R hip XR was negative for fracture/disclocation.  Please ask her to schedule an appointment with someone in clinic if her pain has acutely worsened and if I am unavailable.

## 2016-10-24 ENCOUNTER — Encounter: Payer: Self-pay | Admitting: Family Medicine

## 2016-10-24 ENCOUNTER — Ambulatory Visit (INDEPENDENT_AMBULATORY_CARE_PROVIDER_SITE_OTHER): Payer: Medicare Other | Admitting: Family Medicine

## 2016-10-24 VITALS — BP 112/70 | HR 73 | Temp 98.3°F | Ht 60.0 in | Wt 154.0 lb

## 2016-10-24 DIAGNOSIS — Z1159 Encounter for screening for other viral diseases: Secondary | ICD-10-CM

## 2016-10-24 DIAGNOSIS — M25551 Pain in right hip: Secondary | ICD-10-CM

## 2016-10-24 NOTE — Progress Notes (Signed)
   Subjective:   Patient ID: Susan Keith    DOB: [birth year], 71 y.o. female   MRN: [id number]  CC: R hip pain   HPI: [name] is a 71 y.o. female who presents to clinic today for ongoing right hip pain.  She notes the pain is about the same and has not gotten better.  At night she feels as though she can't position her leg in a comfortable position to sleep well.  She has been doing back exercises as we had discussed ie leg raises (both hip flexion and lateral raises) which have been helping a little.  Additionally she has been taking anti-inflammatories at home with some minimal relief.  She denies numbness and tingling.  She denies shooting pain through the leg.  Denies recent falls.   ROS: Denies fevers, chills, nausea, abdominal pain.  Denies numbness and tingling.    [place]: Pertinent past medical, surgical, family, and social history were reviewed and updated as appropriate. Smoking status reviewed.   Medications reviewed.   Objective:   BP 112/70   Pulse 73   Temp 98.3 F (36.8 C) (Oral)   Ht 5' (1.524 m)   Wt 69.9 kg (154 lb)   SpO2 97%   BMI 30.08 kg/m  Vitals and nursing note reviewed.  General: pleasant 71 yo F, NAD  HEENT: normal  Neck: supple CV: RRR no MRG  Lungs: CTA B/L with normal WOB  Skin: warm, dry, no bruising over right hip Extremities: no gross deformities noted, no TTP over greater trochanter, negative straight leg test and 5/5 strength bilaterally in LE with normal sensation, +pain with hip flexion and external rotation when supine  Assessment & Plan:   Right hip pain Continues to have right hip pain.  Reviewed DG R hip from 4/30 which revealed degenerative changes in lumbar spine as well as both hips.  Additionally, subtle sclerotic changes in both femoral heads, cannot rule out avascular necrosis.   -As patient's pain has not improved with antiinflammatories, will obtain MRI without contrast to further evaluate.  Patient with SCr of 1.21 per  last set of labs 4/30.   Orders Placed This Encounter  Procedures  . MR HIP RIGHT WO CONTRAST    Standing Status:   Future    Standing Expiration Date:   12/24/2017    Order Specific Question:   Reason for Exam (SYMPTOM  OR DIAGNOSIS REQUIRED)    Answer:   R hip pain    Order Specific Question:   What is the patient's sedation requirement?    Answer:   No Sedation    Order Specific Question:   Does the patient have a pacemaker or implanted devices?    Answer:   No    Order Specific Question:   Preferred imaging location?    Answer:   [facility] (table limit-500 lbs)    Order Specific Question:   Radiology Contrast Protocol - do NOT remove file path    Answer:   \\[email or web address]\Radiant\[email or web address].PDF   No orders of the defined types were placed in this encounter.  Follow up: 1 month   Lovenia Kim, MD [place], PGY-1 10/30/2016 4:21 PM

## 2016-10-24 NOTE — Patient Instructions (Signed)
It was nice seeing you in clinic today! We discussed your XR results and since you have continued to have right hip pain, I have ordered a MRI for you.  You will be scheduled for this by our nurse.  Additionally, I have tested you for hepatitis C and will follow up with you with the results of both tests.  In the meantime, you can continue to use anti-inflammatories twice daily for your hip pain and continue the exercises that have been helping you.   Be well,  Lovenia Kim, MD

## 2016-10-24 NOTE — Telephone Encounter (Signed)
I explained to patient at visit today that the reason I was asking for her to make an appointment was that I would like to re-evaluate her and discuss results of her right hip X-ray.  Since pain has not improved over the last several weeks, I have sent her for an MRI.   Do not need to call daughter for this. FYI only

## 2016-10-30 NOTE — Assessment & Plan Note (Signed)
Continues to have right hip pain.  Reviewed DG R hip from 4/30 which revealed degenerative changes in lumbar spine as well as both hips.  Additionally, subtle sclerotic changes in both femoral heads, cannot rule out avascular necrosis.   -As patient's pain has not improved with antiinflammatories, will obtain MRI without contrast to further evaluate.  Patient with SCr of 1.21 per last set of labs 4/30.

## 2016-11-06 ENCOUNTER — Ambulatory Visit (HOSPITAL_COMMUNITY)
Admission: RE | Admit: 2016-11-06 | Discharge: 2016-11-06 | Disposition: A | Discharge status: Home / Self Care | Payer: Medicare Other | Source: Ambulatory Visit | Attending: Family Medicine | Admitting: Family Medicine

## 2016-11-06 DIAGNOSIS — M25551 Pain in right hip: Secondary | ICD-10-CM

## 2016-11-06 DIAGNOSIS — D259 Leiomyoma of uterus, unspecified: Secondary | ICD-10-CM | POA: Diagnosis not present

## 2016-11-06 DIAGNOSIS — M47896 Other spondylosis, lumbar region: Secondary | ICD-10-CM | POA: Diagnosis not present

## 2016-11-21 ENCOUNTER — Encounter: Payer: Self-pay | Admitting: Family Medicine

## 2016-11-21 ENCOUNTER — Ambulatory Visit (INDEPENDENT_AMBULATORY_CARE_PROVIDER_SITE_OTHER): Payer: Medicare Other | Admitting: Family Medicine

## 2016-11-21 VITALS — BP 102/78 | HR 77 | Temp 97.4°F | Ht 60.0 in | Wt 153.0 lb

## 2016-11-21 DIAGNOSIS — R7303 Prediabetes: Secondary | ICD-10-CM | POA: Diagnosis not present

## 2016-11-21 DIAGNOSIS — M25551 Pain in right hip: Secondary | ICD-10-CM | POA: Diagnosis not present

## 2016-11-21 LAB — POCT GLYCOSYLATED HEMOGLOBIN (HGB A1C): HEMOGLOBIN A1C: 6.3

## 2016-11-21 MED ORDER — MELOXICAM 7.5 MG PO TABS: 7.5000 mg | ORAL_TABLET | Freq: Every day | ORAL | 0 refills | Status: DC

## 2016-11-21 MED ORDER — ONDANSETRON HCL 4 MG PO TABS: 4.0000 mg | ORAL_TABLET | Freq: Three times a day (TID) | ORAL | 0 refills | Status: DC | PRN

## 2016-11-21 NOTE — Patient Instructions (Signed)
I have prescribed you Mobic for your hip pain.  You can take this once a day with meals and I expect it to help you with your symptoms.  As we discussed your MRI was normal and I have given you a copy of the radiologist's read.  Please call clinic with any questions you may have and I will follow up with you in 1 month.    Be well,  Lovenia Kim, MD

## 2016-11-21 NOTE — Progress Notes (Signed)
   Subjective:   Patient ID: Susan Keith    DOB: 01-Jan-1946, 71 y.o. female   MRN: [id number]  CC: follow up hip pain   HPI: Susan Keith is a 71 y.o. female who presents to clinic today for follow up hip pain.  Pt notes she continues to have hip pain.  Reviewed MRI with patient today and explained that this does not indicate any fractures, dislocation or avascular necrosis.  She does not have any new changes with the pain and has no other concerns at this time however is happy that the result of imaging was negative.  She has been taking Ibuprofen in the past but does not any longer due to stomach upset.  She mentions she has been having some nausea in the morning for past 2 days and would like an anti-emetic.  Denies fever, chills, vomiting, abdominal pain.  Denies dizziness and lightheadedness.  States there are no other associated symptoms.    ROS: See HPI for pertinent ROS. Smoking status reviewed.  Medications reviewed.  Objective:   BP 102/78   Pulse 77   Temp 97.4 F (36.3 C) (Oral)   Ht 5' (1.524 m)   Wt 153 lb (69.4 kg)   SpO2 97%   BMI 29.88 kg/m  Vitals and nursing note reviewed.  General: pleasant 71 yo F, no acute distress  HEENT: normal  Neck: supple CV: RRR no MRG, 2+ pedal pulses  Lungs: CTA B/L, work of breathing is comfortable  Skin: warm, dry  Extremities: no gross deformities noted, no TTP over greater trochanter, negative straight leg test and 5/5 strength bilaterally in LE with normal sensation, +pain with hip flexion and external rotation when supine  Assessment & Plan:   Right hip pain Pain stable from prior with no acute changes.  Reviewed MRI is normal.  -Will try Mobic 7.5 mg daily and can titrate up as needed   Orders Placed This Encounter  Procedures  . HgB A1c   Meds ordered this encounter  Medications  . meloxicam (MOBIC) 7.5 MG tablet    Sig: Take 1 tablet (7.5 mg total) by mouth daily.    Dispense:  30 tablet    Refill:  0  .  ondansetron (ZOFRAN) 4 MG tablet    Sig: Take 1 tablet (4 mg total) by mouth every 8 (eight) hours as needed for nausea or vomiting.    Dispense:  20 tablet    Refill:  0   Follow up : 1 month   Lovenia Kim, MD [place], PGY-1 11/30/2016 1:57 AM

## 2016-11-22 ENCOUNTER — Telehealth: Payer: Self-pay | Admitting: *Deleted

## 2016-11-22 NOTE — Telephone Encounter (Signed)
Prior Authorization received from [employer] for Ondansetron HCL 4 mg. Formulary and PA form placed in provider box for completion. Derl Barrow, RN

## 2016-11-28 ENCOUNTER — Telehealth: Payer: Self-pay | Admitting: *Deleted

## 2016-11-28 NOTE — Telephone Encounter (Signed)
Prior Authorization received from [place] for Ondansetron 4 mg tablets.  PA form placed in provider box for completion. Derl Barrow, RN

## 2016-11-29 NOTE — Telephone Encounter (Signed)
PA form faxed to [facility] for review.  Derl Barrow, RN

## 2016-11-30 NOTE — Assessment & Plan Note (Signed)
Pain stable from prior with no acute changes.  Reviewed MRI is normal.  -Will try Mobic 7.5 mg daily and can titrate up as needed

## 2016-12-02 NOTE — Telephone Encounter (Signed)
Medication is covered under Medicare Part D.  Derl Barrow, RN

## 2016-12-25 ENCOUNTER — Other Ambulatory Visit: Payer: Self-pay | Admitting: *Deleted

## 2016-12-25 MED ORDER — MELOXICAM 7.5 MG PO TABS: 7.5000 mg | ORAL_TABLET | Freq: Every day | ORAL | 0 refills | Status: DC

## 2016-12-27 ENCOUNTER — Encounter: Payer: Self-pay | Admitting: Family Medicine

## 2016-12-27 ENCOUNTER — Ambulatory Visit (INDEPENDENT_AMBULATORY_CARE_PROVIDER_SITE_OTHER): Payer: Medicare Other | Admitting: Family Medicine

## 2016-12-27 DIAGNOSIS — R110 Nausea: Secondary | ICD-10-CM | POA: Diagnosis present

## 2016-12-27 DIAGNOSIS — M25551 Pain in right hip: Secondary | ICD-10-CM

## 2016-12-27 MED ORDER — MELOXICAM 7.5 MG PO TABS: 7.5000 mg | ORAL_TABLET | Freq: Every day | ORAL | 0 refills | Status: DC

## 2016-12-27 NOTE — Progress Notes (Signed)
   Subjective:   Patient ID: Susan Keith    DOB: 05-23-1946, 71 y.o. female   MRN: [id number]  CC:  Follow up hip pain, nausea  HPI: Susan Keith is a 71 y.o. female who presents to clinic today for follow up of nausea, hip pain.   Pt speaks [place] and a video interpreter was used for this encounter Sanda Klein, ID# [id number]).   Nausea  -Continued. Not associated with vomiting.  Occurs on most days especially upon waking up but sometimes during the day as well.   No syncopal episodes of h/o fall or hitting head.  Notes she feels dizzy sometimes when looks down and up but this quickly goes away on its own.  Does not have any balance issues.  Feels nauseous if she doesn't eat.  No ringing in ears or hearing changes.   -Unfortunately has not been able to pick up her prescribed Zofran despite 3 attempts -- she was told by her pharmacy this may be an insurance issue.    Hip pain -Notes significant improvement with Mobic since our last visit.  -takes it everyday   ROS: Denies fevers, chills, vomiting, diarrhea.  Denies CP, SOB.   [place]: Pertinent past medical, surgical, family, and social history were reviewed and updated as appropriate. Smoking status reviewed. Medications reviewed.   Objective:   BP 128/78   Pulse 86   Temp 98.1 F (36.7 C) (Oral)   Ht 5' (1.524 m)   Wt 156 lb (70.8 kg)   BMI 30.47 kg/m  Vitals and nursing note reviewed.  General: pleasant 71 yo female, no acute distress  HEENT: NCAT, TM clear and visible bilaterally, MMM, o/p clear  Neck: supple, nontender  CV: RRR no MRG  Lungs: CTAB with comfortable WOB  Abdomen: soft, NTND, +bs  Skin: warm, dry, no rashes  Neuro: alert, oriented x3, CN II-XII intact, 5/5 strength bilaterally in upper and lower extremities, normal speech, normal gait, sensation intact  Assessment & Plan:   Nausea without vomiting Unable to elicit a thorough history from patient due to language barrier, although [place] video interpreter was  used, patient unable to put her symptoms into words.   Could consider BPPV although patient not able to participate in [place] in office today due to difficulty following direction.  Will re-attempt at next visit.  -Gait and balance unaffected and no red flags on exam  -Can consider trial Antivert to see if this helps if symptoms persist  -Discussed Zofran Rx with her pharmacy - there are no insurance issues and confirmed that it is available for her to pick up today -continue to monitor   Right hip pain Reports her pain has significantly improved on Mobic.  -Continue Mobic 7.5 MG daily  -Refill provided   Meds ordered this encounter  Medications  . meloxicam (MOBIC) 7.5 MG tablet    Sig: Take 1 tablet (7.5 mg total) by mouth daily.    Dispense:  30 tablet    Refill:  0   Follow up: 2-3 weeks or sooner if needed   Lovenia Kim, MD [place], PGY-1 12/29/2016 3:45 PM

## 2016-12-27 NOTE — Patient Instructions (Signed)
It was nice seeing you again ! You were seen for follow up on your nausea and hip pain.  You were prescribed Mobic previously which seems to be working and I would like you to continue this.  I have sent in a refill for you.  Also, I  have called the pharmacy to make sure there are no issues with insurance for your nausea medications.   It is available for you to pick up today.   Please call clinic with any questions.   Be well,  Lovenia Kim, MD

## 2016-12-29 DIAGNOSIS — R110 Nausea: Secondary | ICD-10-CM | POA: Insufficient documentation

## 2016-12-29 NOTE — Assessment & Plan Note (Addendum)
Unable to elicit a thorough history from patient due to language barrier, although [place] video interpreter was used, patient unable to put her symptoms into words.   Could consider BPPV although patient not able to participate in [place] in office today due to difficulty following direction.  Will re-attempt at next visit.  -Gait and balance unaffected and no red flags on exam  -Can consider trial Antivert to see if this helps if symptoms persist  -Discussed Zofran Rx with her pharmacy - there are no insurance issues and confirmed that it is available for her to pick up today -continue to monitor

## 2016-12-29 NOTE — Assessment & Plan Note (Signed)
Reports her pain has significantly improved on Mobic.  -Continue Mobic 7.5 MG daily  -Refill provided

## 2017-01-01 ENCOUNTER — Other Ambulatory Visit: Payer: Self-pay | Admitting: Family Medicine

## 2017-01-06 ENCOUNTER — Encounter (HOSPITAL_COMMUNITY): Payer: Self-pay

## 2017-01-06 ENCOUNTER — Inpatient Hospital Stay (HOSPITAL_COMMUNITY)
Admission: EM | Admit: 2017-01-06 | Discharge: 2017-01-08 | DRG: 641 | Disposition: A | Discharge status: Home / Self Care | Payer: Medicare Other | Attending: Family Medicine | Admitting: Family Medicine

## 2017-01-06 DIAGNOSIS — R350 Frequency of micturition: Secondary | ICD-10-CM | POA: Diagnosis present

## 2017-01-06 DIAGNOSIS — Z833 Family history of diabetes mellitus: Secondary | ICD-10-CM

## 2017-01-06 DIAGNOSIS — K5909 Other constipation: Secondary | ICD-10-CM | POA: Diagnosis present

## 2017-01-06 DIAGNOSIS — Z7984 Long term (current) use of oral hypoglycemic drugs: Secondary | ICD-10-CM

## 2017-01-06 DIAGNOSIS — E785 Hyperlipidemia, unspecified: Secondary | ICD-10-CM | POA: Diagnosis present

## 2017-01-06 DIAGNOSIS — F17210 Nicotine dependence, cigarettes, uncomplicated: Secondary | ICD-10-CM | POA: Diagnosis present

## 2017-01-06 DIAGNOSIS — E039 Hypothyroidism, unspecified: Secondary | ICD-10-CM | POA: Diagnosis present

## 2017-01-06 DIAGNOSIS — R7303 Prediabetes: Secondary | ICD-10-CM | POA: Diagnosis not present

## 2017-01-06 DIAGNOSIS — K219 Gastro-esophageal reflux disease without esophagitis: Secondary | ICD-10-CM | POA: Diagnosis present

## 2017-01-06 DIAGNOSIS — K5900 Constipation, unspecified: Secondary | ICD-10-CM

## 2017-01-06 DIAGNOSIS — Z79899 Other long term (current) drug therapy: Secondary | ICD-10-CM | POA: Diagnosis not present

## 2017-01-06 DIAGNOSIS — I10 Essential (primary) hypertension: Secondary | ICD-10-CM | POA: Diagnosis not present

## 2017-01-06 DIAGNOSIS — N179 Acute kidney failure, unspecified: Secondary | ICD-10-CM | POA: Diagnosis not present

## 2017-01-06 DIAGNOSIS — E871 Hypo-osmolality and hyponatremia: Secondary | ICD-10-CM | POA: Diagnosis not present

## 2017-01-06 DIAGNOSIS — E861 Hypovolemia: Secondary | ICD-10-CM | POA: Diagnosis not present

## 2017-01-06 DIAGNOSIS — Z791 Long term (current) use of non-steroidal anti-inflammatories (NSAID): Secondary | ICD-10-CM

## 2017-01-06 DIAGNOSIS — N3000 Acute cystitis without hematuria: Secondary | ICD-10-CM

## 2017-01-06 DIAGNOSIS — K449 Diaphragmatic hernia without obstruction or gangrene: Secondary | ICD-10-CM | POA: Diagnosis not present

## 2017-01-06 HISTORY — DX: Prediabetes: R73.03

## 2017-01-06 HISTORY — DX: Gastro-esophageal reflux disease without esophagitis: K21.9

## 2017-01-06 HISTORY — DX: Unspecified osteoarthritis, unspecified site: M19.90

## 2017-01-06 HISTORY — DX: Benign neoplasm of connective and other soft tissue, unspecified: D21.9

## 2017-01-06 HISTORY — DX: Essential (primary) hypertension: I10

## 2017-01-06 HISTORY — DX: Pure hypercholesterolemia, unspecified: E78.00

## 2017-01-06 LAB — COMPREHENSIVE METABOLIC PANEL
ALT: 21 U/L (ref 14–54)
ANION GAP: 16 — AB (ref 5–15)
AST: 35 U/L (ref 15–41)
Albumin: 4.1 g/dL (ref 3.5–5.0)
Alkaline Phosphatase: 64 U/L (ref 38–126)
BUN: 17 mg/dL (ref 6–20)
CALCIUM: 9.1 mg/dL (ref 8.9–10.3)
CHLORIDE: 76 mmol/L — AB (ref 101–111)
CO2: 22 mmol/L (ref 22–32)
CREATININE: 1.19 mg/dL — AB (ref 0.44–1.00)
GFR, EST AFRICAN AMERICAN: 53 mL/min — AB (ref >60)
GFR, EST NON AFRICAN AMERICAN: 46 mL/min — AB (ref >60)
Glucose, Bld: 85 mg/dL (ref 65–99)
Potassium: 3.7 mmol/L (ref 3.5–5.1)
Sodium: 114 mmol/L — CL (ref 135–145)
Total Bilirubin: 1.6 mg/dL — ABNORMAL HIGH (ref 0.3–1.2)
Total Protein: 6.9 g/dL (ref 6.5–8.1)

## 2017-01-06 LAB — URINALYSIS, ROUTINE W REFLEX MICROSCOPIC
BILIRUBIN URINE: NEGATIVE
GLUCOSE, UA: NEGATIVE mg/dL
KETONES UR: 20 mg/dL — AB
Nitrite: POSITIVE — AB
PH: 6 (ref 5.0–8.0)
Protein, ur: NEGATIVE mg/dL
Specific Gravity, Urine: 1.013 (ref 1.005–1.030)

## 2017-01-06 LAB — CBC
HCT: 33.9 % — ABNORMAL LOW (ref 36.0–46.0)
Hemoglobin: 12.1 g/dL (ref 12.0–15.0)
MCH: 28.9 pg (ref 26.0–34.0)
MCHC: 35.7 g/dL (ref 30.0–36.0)
MCV: 80.9 fL (ref 78.0–100.0)
PLATELETS: 282 10*3/uL (ref 150–400)
RBC: 4.19 MIL/uL (ref 3.87–5.11)
RDW: 12.2 % (ref 11.5–15.5)
WBC: 8.9 10*3/uL (ref 4.0–10.5)

## 2017-01-06 LAB — LIPASE, BLOOD: LIPASE: 29 U/L (ref 11–51)

## 2017-01-06 MED ORDER — SODIUM CHLORIDE 0.9 % IV BOLUS (SEPSIS): 1000.0000 mL | Freq: Once | INTRAVENOUS | Status: DC

## 2017-01-06 MED ORDER — IOPAMIDOL (ISOVUE-300) INJECTION 61%
INTRAVENOUS | Status: AC
  Filled 2017-01-06: qty 100

## 2017-01-06 MED ORDER — DEXTROSE 5 % IV SOLN
1.0000 g | Freq: Once | INTRAVENOUS | Status: AC
  Administered 2017-01-07: 1 g via INTRAVENOUS
  Filled 2017-01-06: qty 10

## 2017-01-06 MED ORDER — ONDANSETRON HCL 4 MG/2ML IJ SOLN
4.0000 mg | Freq: Once | INTRAMUSCULAR | Status: AC
  Administered 2017-01-07: 4 mg via INTRAVENOUS
  Filled 2017-01-06: qty 2

## 2017-01-06 NOTE — ED Triage Notes (Signed)
PT reports nausea x several months that has been sever over last 4 days and constipation since the beginning of last week. PT reports fasting for the last 4 days with no improvement. PT daughter reports extreme fatigue and that the pt is not herself. She reports pts has attempted many otc remedies for constipation with no relief. Pt reports last bm was mucus but no fecal matter.

## 2017-01-06 NOTE — ED Provider Notes (Signed)
[facility] DEPT Provider Note   CSN: [id number] Arrival date & time: 01/06/17  1951     History   Chief Complaint Chief Complaint  Patient presents with  . Nausea  . Constipation    HPI Susan Keith is a 71 y.o. female.  Patient presents to the ED with a chief complaint of abdominal pain.  She reports associated nausea.  She states that the nausea has actually been persistent for the past several months, but she reports worsening abdominal pain and inability to move her bowels for the past several days.  She states that her abdomen seems distended.  She reports associate fatigue.  She denies any other associated symptoms.  There are no modifying factors.   The history is provided by the patient. The history is limited by a language barrier. No language interpreter was used (daughter assisted).    Past Medical History:  Diagnosis Date  . GERD (gastroesophageal reflux disease)   . Hypertension     Patient Active Problem List   Diagnosis Date Noted  . Nausea without vomiting 12/29/2016  . Right hip pain 09/23/2016  . Back pain 09/09/2016    Past Surgical History:  Procedure Laterality Date  . APPENDECTOMY      OB History    No data available       Home Medications    Prior to Admission medications   Medication Sig Start Date End Date Taking? Authorizing Provider  atorvastatin (LIPITOR) 20 MG tablet TAKE 1 TABLET BY MOUTH EVERY DAY 01/03/17  Yes Lovenia Kim, MD  hydrochlorothiazide (HYDRODIURIL) 25 MG tablet TAKE 1 TABLET BY MOUTH EVERY DAY 01/03/17  Yes Lovenia Kim, MD  lisinopril (PRINIVIL,ZESTRIL) 20 MG tablet TAKE 1 TABLET BY MOUTH EVERY DAY 01/03/17  Yes Lovenia Kim, MD  meloxicam (MOBIC) 7.5 MG tablet Take 1 tablet (7.5 mg total) by mouth daily. 12/27/16  Yes Lovenia Kim, MD  metFORMIN (GLUCOPHAGE XR) 750 MG 24 hr tablet Take 1 tablet (750 mg total) by mouth daily with breakfast. Patient taking differently: Take 750 mg by mouth at bedtime.  09/06/16   Yes Lovenia Kim, MD  omeprazole (PRILOSEC) 40 MG capsule Take 1 capsule (40 mg total) by mouth daily. 09/06/16  Yes Lovenia Kim, MD  ondansetron (ZOFRAN) 4 MG tablet Take 1 tablet (4 mg total) by mouth every 8 (eight) hours as needed for nausea or vomiting. 11/21/16  Yes Lovenia Kim, MD  verapamil (VERELAN PM) 180 MG 24 hr capsule Take 1 capsule (180 mg total) by mouth daily. 09/06/16  Maylon Peppers, MD    Family History No family history on file.  Social History Social History  Substance Use Topics  . Smoking status: Current Every Day Smoker    Packs/day: 0.25    Types: Cigarettes  . Smokeless tobacco: Never Used  . Alcohol use No     Allergies   Patient has no known allergies.   Review of Systems Review of Systems  All other systems reviewed and are negative.    Physical Exam Updated Vital Signs BP (!) 148/77 (BP Location: Right Arm)   Pulse 75   Temp 99 F (37.2 C) (Oral)   Resp 18   SpO2 97%   Physical Exam  Constitutional: She is oriented to person, place, and time. She appears well-developed and well-nourished.  HENT:  Head: Normocephalic and atraumatic.  Eyes: Pupils are equal, round, and reactive to light. Conjunctivae and EOM are normal.  Neck: Normal range of motion. Neck supple.  Cardiovascular: Normal rate and regular rhythm.  Exam reveals no gallop and no friction rub.   No murmur heard. Pulmonary/Chest: Effort normal and breath sounds normal. No respiratory distress. She has no wheezes. She has no rales. She exhibits no tenderness.  Abdominal: Soft. Bowel sounds are normal. She exhibits no distension and no mass. There is no tenderness. There is no rebound and no guarding.  Musculoskeletal: Normal range of motion. She exhibits no edema or tenderness.  Neurological: She is alert and oriented to person, place, and time.  Skin: Skin is warm and dry.  Psychiatric: She has a normal mood and affect. Her behavior is normal. Judgment and thought  content normal.  Nursing note and vitals reviewed.    ED Treatments / Results  Labs (all labs ordered are listed, but only abnormal results are displayed) Labs Reviewed  COMPREHENSIVE METABOLIC PANEL - Abnormal; Notable for the following:       Result Value   Sodium 114 (*)    Chloride 76 (*)    Creatinine, Ser 1.19 (*)    Total Bilirubin 1.6 (*)    GFR calc non Af Amer 46 (*)    GFR calc Af Amer 53 (*)    Anion gap 16 (*)    All other components within normal limits  CBC - Abnormal; Notable for the following:    HCT 33.9 (*)    All other components within normal limits  URINALYSIS, ROUTINE W REFLEX MICROSCOPIC - Abnormal; Notable for the following:    APPearance HAZY (*)    Hgb urine dipstick SMALL (*)    Ketones, ur 20 (*)    Nitrite POSITIVE (*)    Leukocytes, UA MODERATE (*)    Bacteria, UA RARE (*)    Squamous Epithelial / LPF 0-5 (*)    All other components within normal limits  LIPASE, BLOOD    EKG  EKG Interpretation None       Radiology Ct Abdomen Pelvis W Contrast  Result Date: 01/07/2017 CLINICAL DATA:  Abdominal distention. Nausea for several months, increased over the last few days. EXAM: CT ABDOMEN AND PELVIS WITH CONTRAST TECHNIQUE: Multidetector CT imaging of the abdomen and pelvis was performed using the standard protocol following bolus administration of intravenous contrast. CONTRAST:  179mL ISOVUE-300 IOPAMIDOL (ISOVUE-300) INJECTION 61% COMPARISON:  None. FINDINGS: Lower chest: Moderate hiatal hernia. Mild adjacent compressive atelectasis. Lung bases are otherwise clear. Hepatobiliary: Small 8 mm subcapsular enhancement in the in segment 6 of the liver is incompletely characterize, possible flash filling hemangioma or 8 shunt. No suspicious hepatic lesions. Gallbladder physiologically distended, no calcified stone. No biliary dilatation. Pancreas: Parenchymal atrophy. No ductal dilatation or inflammation. Spleen: Normal in size without focal  abnormality. Adrenals/Urinary Tract: No adrenal nodule. No hydronephrosis or perinephric edema. Mild renal cortical thinning, right greater than left. There are bilateral renal cysts. Mild right ureteral prominence, of doubtful clinical significance. Symmetric renal excretion on delayed phase imaging. Urinary bladder is physiologically distended. No bladder wall thickening. Stomach/Bowel: Moderate hiatal hernia. No small bowel dilatation or inflammation. There is fecalization of small bowel contents in the central abdomen. Small colonic stool burden. No significant diverticular disease. No bowel inflammation or obstruction. Appendix not visualized, surgically absent per report. Vascular/Lymphatic: Mild aortic and branch atherosclerosis. No aneurysm. No abdominopelvic adenopathy. Reproductive: Heterogeneous lesion in the left aspect of the uterus measures 7.5 cm, likely uterine fibroid. Probable additional fibroid in the right uterine fundus measuring 4.8 cm, partially calcified. The left ovary is tentatively identified  superior to the uterus containing a 3.0 cm low-density structure, less likely this may be a uterine fibroid. Right ovary is tentatively identified and normal. Other: No free air or ascites. No intra-abdominal abscess. Tiny fat containing umbilical hernia. Scattered posterior soft tissue calcifications. Musculoskeletal: Scoliosis and degenerative change in the spine. There are no acute or suspicious osseous abnormalities. IMPRESSION: 1. Globular heterogeneous uterus with at least two uterine fibroids. The left ovary is tentatively identified displaced superior to the uterus with a 3.0 cm low-density structure. This is a probably benign cyst, recommend nonemergent ultrasound characterization. The ovary may be difficult to visualize due to adjacent fibroids, in which case MRI could be considered. 2. Fecalization of small bowel contents suggest slow transit. No bowel obstruction or bowel inflammation. 3.  Moderate hiatal hernia. 4. Mild renal cortical thinning and bilateral renal cysts. 5.  Aortic Atherosclerosis (ICD10-I70.0). Electronically Signed   By: Jeb Levering M.D.   On: 01/07/2017 01:33    Procedures Procedures (including critical care time) CRITICAL CARE Performed by: Montine Circle  Na=114 Total critical care time: 32 minutes  Critical care time was exclusive of separately billable procedures and treating other patients.  Critical care was necessary to treat or prevent imminent or life-threatening deterioration.  Critical care was time spent personally by me on the following activities: development of treatment plan with patient and/or surrogate as well as nursing, discussions with consultants, evaluation of patient's response to treatment, examination of patient, obtaining history from patient or surrogate, ordering and performing treatments and interventions, ordering and review of laboratory studies, ordering and review of radiographic studies, pulse oximetry and re-evaluation of patient's condition.  Medications Ordered in ED Medications  sodium chloride 0.9 % bolus 1,000 mL (not administered)  ondansetron (ZOFRAN) injection 4 mg (not administered)  iopamidol (ISOVUE-300) 61 % injection (not administered)     Initial Impression / Assessment and Plan / ED Course  I have reviewed the triage vital signs and the nursing notes.  Pertinent labs & imaging results that were available during my care of the patient were reviewed by me and considered in my medical decision making (see chart for details).     Patient with nausea and abdominal pain.  Doesn't exhibit tenderness on exam, but states that she feels bloated and distended.  Has had prior abdominal surgery and is now having increased pain.  Will check CT.  UA concerning for UTI.  Will treat with rocephin.  Na and Cl are very low.  Patient will need admission for electrolyte disturbance.    Patient seen by and  discussed with Dr. Regenia Skeeter, who agrees with the plan.  [place] service for admitting the patient.  Final Clinical Impressions(s) / ED Diagnoses   Final diagnoses:  Hyponatremia  Constipation, unspecified constipation type  Acute cystitis without hematuria    New Prescriptions New Prescriptions   No medications on file     Montine Circle, PA-C 01/07/17 0226    Montine Circle, PA-C 01/07/17 0315    Sherwood Gambler, MD 01/11/17 1027

## 2017-01-07 ENCOUNTER — Encounter (HOSPITAL_COMMUNITY): Payer: Self-pay | Admitting: General Practice

## 2017-01-07 ENCOUNTER — Emergency Department (HOSPITAL_COMMUNITY): Payer: Medicare Other

## 2017-01-07 DIAGNOSIS — E871 Hypo-osmolality and hyponatremia: Secondary | ICD-10-CM | POA: Diagnosis not present

## 2017-01-07 DIAGNOSIS — K449 Diaphragmatic hernia without obstruction or gangrene: Secondary | ICD-10-CM | POA: Diagnosis not present

## 2017-01-07 DIAGNOSIS — Z833 Family history of diabetes mellitus: Secondary | ICD-10-CM | POA: Diagnosis not present

## 2017-01-07 DIAGNOSIS — K5900 Constipation, unspecified: Secondary | ICD-10-CM | POA: Diagnosis not present

## 2017-01-07 DIAGNOSIS — I10 Essential (primary) hypertension: Secondary | ICD-10-CM | POA: Insufficient documentation

## 2017-01-07 DIAGNOSIS — E861 Hypovolemia: Secondary | ICD-10-CM | POA: Diagnosis not present

## 2017-01-07 DIAGNOSIS — N3000 Acute cystitis without hematuria: Secondary | ICD-10-CM

## 2017-01-07 DIAGNOSIS — K219 Gastro-esophageal reflux disease without esophagitis: Secondary | ICD-10-CM | POA: Diagnosis not present

## 2017-01-07 DIAGNOSIS — R7303 Prediabetes: Secondary | ICD-10-CM | POA: Diagnosis not present

## 2017-01-07 DIAGNOSIS — R350 Frequency of micturition: Secondary | ICD-10-CM | POA: Diagnosis not present

## 2017-01-07 DIAGNOSIS — E039 Hypothyroidism, unspecified: Secondary | ICD-10-CM | POA: Diagnosis not present

## 2017-01-07 DIAGNOSIS — Z791 Long term (current) use of non-steroidal anti-inflammatories (NSAID): Secondary | ICD-10-CM | POA: Diagnosis not present

## 2017-01-07 DIAGNOSIS — K5909 Other constipation: Secondary | ICD-10-CM | POA: Diagnosis not present

## 2017-01-07 DIAGNOSIS — F17210 Nicotine dependence, cigarettes, uncomplicated: Secondary | ICD-10-CM | POA: Diagnosis not present

## 2017-01-07 DIAGNOSIS — Z79899 Other long term (current) drug therapy: Secondary | ICD-10-CM | POA: Diagnosis not present

## 2017-01-07 DIAGNOSIS — N179 Acute kidney failure, unspecified: Secondary | ICD-10-CM | POA: Diagnosis not present

## 2017-01-07 DIAGNOSIS — E785 Hyperlipidemia, unspecified: Secondary | ICD-10-CM | POA: Diagnosis not present

## 2017-01-07 DIAGNOSIS — Z7984 Long term (current) use of oral hypoglycemic drugs: Secondary | ICD-10-CM | POA: Diagnosis not present

## 2017-01-07 LAB — BASIC METABOLIC PANEL
ANION GAP: 10 (ref 5–15)
ANION GAP: 8 (ref 5–15)
Anion gap: 14 (ref 5–15)
BUN: 16 mg/dL (ref 6–20)
BUN: 15 mg/dL (ref 6–20)
BUN: 17 mg/dL (ref 6–20)
CALCIUM: 9.2 mg/dL (ref 8.9–10.3)
CALCIUM: 8.9 mg/dL (ref 8.9–10.3)
CHLORIDE: 83 mmol/L — AB (ref 101–111)
CHLORIDE: 84 mmol/L — AB (ref 101–111)
CHLORIDE: 90 mmol/L — AB (ref 101–111)
CO2: 24 mmol/L (ref 22–32)
CO2: 26 mmol/L (ref 22–32)
CO2: 24 mmol/L (ref 22–32)
CREATININE: 1.2 mg/dL — AB (ref 0.44–1.00)
Calcium: 9.5 mg/dL (ref 8.9–10.3)
Creatinine, Ser: 1.13 mg/dL — ABNORMAL HIGH (ref 0.44–1.00)
Creatinine, Ser: 1.19 mg/dL — ABNORMAL HIGH (ref 0.44–1.00)
GFR calc Af Amer: 53 mL/min — ABNORMAL LOW (ref >60)
GFR calc non Af Amer: 45 mL/min — ABNORMAL LOW (ref >60)
GFR calc non Af Amer: 49 mL/min — ABNORMAL LOW (ref >60)
GFR calc non Af Amer: 46 mL/min — ABNORMAL LOW (ref >60)
GFR, EST AFRICAN AMERICAN: 57 mL/min — AB (ref >60)
GFR, EST AFRICAN AMERICAN: 53 mL/min — AB (ref >60)
GLUCOSE: 87 mg/dL (ref 65–99)
GLUCOSE: 150 mg/dL — AB (ref 65–99)
GLUCOSE: 94 mg/dL (ref 65–99)
POTASSIUM: 3.7 mmol/L (ref 3.5–5.1)
Potassium: 3.4 mmol/L — ABNORMAL LOW (ref 3.5–5.1)
Potassium: 3.6 mmol/L (ref 3.5–5.1)
SODIUM: 121 mmol/L — AB (ref 135–145)
Sodium: 120 mmol/L — ABNORMAL LOW (ref 135–145)
Sodium: 122 mmol/L — ABNORMAL LOW (ref 135–145)

## 2017-01-07 LAB — OSMOLALITY: OSMOLALITY: 260 mosm/kg — AB (ref 275–295)

## 2017-01-07 LAB — GLUCOSE, CAPILLARY
GLUCOSE-CAPILLARY: 90 mg/dL (ref 65–99)
Glucose-Capillary: 65 mg/dL (ref 65–99)

## 2017-01-07 LAB — TSH: TSH: 6.811 u[IU]/mL — ABNORMAL HIGH (ref 0.350–4.500)

## 2017-01-07 LAB — CBG MONITORING, ED: Glucose-Capillary: 89 mg/dL (ref 65–99)

## 2017-01-07 MED ORDER — LISINOPRIL 20 MG PO TABS
20.0000 mg | ORAL_TABLET | Freq: Every day | ORAL | Status: DC
  Administered 2017-01-07: 20 mg via ORAL
  Filled 2017-01-07: qty 1

## 2017-01-07 MED ORDER — IOPAMIDOL (ISOVUE-300) INJECTION 61%
INTRAVENOUS | Status: AC
  Administered 2017-01-07: 100 mL
  Filled 2017-01-07: qty 100

## 2017-01-07 MED ORDER — ACETAMINOPHEN 325 MG PO TABS: 650.0000 mg | ORAL_TABLET | Freq: Four times a day (QID) | ORAL | Status: DC | PRN

## 2017-01-07 MED ORDER — SENNA 8.6 MG PO TABS
1.0000 | ORAL_TABLET | Freq: Every day | ORAL | Status: DC
  Administered 2017-01-07 – 2017-01-08 (×2): 8.6 mg via ORAL
  Filled 2017-01-07 (×2): qty 1

## 2017-01-07 MED ORDER — ENOXAPARIN SODIUM 40 MG/0.4ML ~~LOC~~ SOLN
40.0000 mg | SUBCUTANEOUS | Status: DC
  Administered 2017-01-07 – 2017-01-08 (×2): 40 mg via SUBCUTANEOUS
  Filled 2017-01-07 (×3): qty 0.4

## 2017-01-07 MED ORDER — ONDANSETRON HCL 4 MG PO TABS: 4.0000 mg | ORAL_TABLET | Freq: Four times a day (QID) | ORAL | Status: DC | PRN

## 2017-01-07 MED ORDER — VERAPAMIL HCL ER 180 MG PO TBCR
180.0000 mg | EXTENDED_RELEASE_TABLET | Freq: Every day | ORAL | Status: DC
  Administered 2017-01-07: 180 mg via ORAL
  Filled 2017-01-07 (×2): qty 1

## 2017-01-07 MED ORDER — SODIUM CHLORIDE 0.9 % IV SOLN
INTRAVENOUS | Status: DC
  Administered 2017-01-07 – 2017-01-08 (×3): via INTRAVENOUS

## 2017-01-07 MED ORDER — POTASSIUM CHLORIDE CRYS ER 20 MEQ PO TBCR
40.0000 meq | EXTENDED_RELEASE_TABLET | Freq: Once | ORAL | Status: AC
  Administered 2017-01-07: 40 meq via ORAL
  Filled 2017-01-07: qty 2

## 2017-01-07 MED ORDER — ACETAMINOPHEN 650 MG RE SUPP: 650.0000 mg | Freq: Four times a day (QID) | RECTAL | Status: DC | PRN

## 2017-01-07 MED ORDER — CALCIUM CARBONATE ANTACID 500 MG PO CHEW
1.0000 | CHEWABLE_TABLET | Freq: Once | ORAL | Status: AC
Start: 2017-01-07 — End: 2017-01-07
  Administered 2017-01-07: 200 mg via ORAL
  Filled 2017-01-07: qty 1

## 2017-01-07 MED ORDER — POLYETHYLENE GLYCOL 3350 17 G PO PACK
17.0000 g | PACK | Freq: Every day | ORAL | Status: DC
  Administered 2017-01-07 – 2017-01-08 (×2): 17 g via ORAL
  Filled 2017-01-07 (×2): qty 1

## 2017-01-07 MED ORDER — PANTOPRAZOLE SODIUM 40 MG PO TBEC
40.0000 mg | DELAYED_RELEASE_TABLET | Freq: Every day | ORAL | Status: DC
  Administered 2017-01-07 – 2017-01-08 (×2): 40 mg via ORAL
  Filled 2017-01-07 (×2): qty 1

## 2017-01-07 MED ORDER — ONDANSETRON HCL 4 MG/2ML IJ SOLN: 4.0000 mg | Freq: Four times a day (QID) | INTRAMUSCULAR | Status: DC | PRN

## 2017-01-07 MED ORDER — GI COCKTAIL ~~LOC~~
30.0000 mL | Freq: Once | ORAL | Status: AC
  Administered 2017-01-07: 30 mL via ORAL
  Filled 2017-01-07: qty 30

## 2017-01-07 MED ORDER — ATORVASTATIN CALCIUM 20 MG PO TABS
20.0000 mg | ORAL_TABLET | Freq: Every day | ORAL | Status: DC
  Administered 2017-01-07: 20 mg via ORAL
  Filled 2017-01-07: qty 1

## 2017-01-07 NOTE — ED Notes (Signed)
Granddaughter in [employer] requesting update for bed, Cristie Hem [phone]

## 2017-01-07 NOTE — Discharge Summary (Signed)
[facility] Discharge Summary  Patient name: Susan Keith Medical record number: [id number] Date of birth: [birth year] Age: 71 y.o. Gender: female Date of Admission: 01/06/2017  Date of Discharge: 01/07/2017 Admitting Physician: Dickie La, MD  Primary Care Provider: Lovenia Kim, MD Consultants: None  Indication for Hospitalization: Hyponatremia  Discharge Diagnoses/Problem List:  -Hyponatremia -Nausea -UTI -AKI -constipation -HTN -Pre-Diabetes -Hiperlipidemia -Uterine Cyst -Hypothyroidism  Disposition: Home  Discharge Condition: Stable  Discharge Exam:  General: NAD, resting comfortably Cardiovascular: rrr, no mrg Respiratory: CTAB, normal work of breathing Gastrointestinal: nl bowel sounds, mildly distended, non-tender to palpation MSK: 5/5 strength in upper extremities and lower extremities bilaterally,  Psych: appropriate affect  Brief Hospital Course:  Pt was admitted due to profound hyponatremia of 114 associated with constipation and nausea. CT abdomen positive for fecalization of small bowel contents suggest slow transit. The patient had decreased PO intakePatient was asymptomatic w/o signs of AMS or seizure. The hyponatremia was thought to have a multifactorial etiology caused by HCTZ and dehydration. HCTZ was stopped and fluid resuscitation was administered. Patients Na corrected at a rate of no more than 2 mEq/4hr with regular BMP checks. Neuro status remained stable with no deficits.   Issues for Follow Up:  1. Ovarian cyst - Incidental finding on CT. Benign appearing. Radiology recommended ultrasound evaluation, consider MRI if further imaging necessary.  2. Chronic nausea - patient and her daughter are anxious that a cause be found for this chronic nausea. Givent CT findings of fecal material in her small bowel concerning for slow transit, this may be causing her chronic nausea. Might benefit from outpatient Gi work up for gastric  motility plus/minus good bowel regimen. 3. Urinary frequency and frequent thirst - could be an effect of the HCTZ she has been taking, but her daughter thinks that these symptoms were an issue before she began taking this medication.  Can try to elucidate whether she has overactive bladder vs. SIADH.  May need a urologist referral. 4. Chronic constipation - needs instruction on management (OTC meds, fluids, fiber), can recheck TSH since it was slightly high at 6 and patient has constipation and hyponatremia, which are symptoms of hypothyroidism. Also on verapamil as an antihypertensive, with constipation a know, common side effect, patient may benefit switching to another agent. 5. Heartburn - Pt had complaints of heartburn associated with nausea. Pt was given Protonix which helped control her symptoms. Recommend outpatient f/u for symptom control and exploration. Likely associated with chronic nausea described above.  6. Patient will be going to [place] on Monday (8/20) for two months, so may need to schedule further workup for when she returns if unable to do many tests before she leaves.  However, her daughter is anxious that workup be done before she leaves if possible. 7. Hypothyroidism - Pt had elevated TSH of 6.811. Please recheck in outpatient.   Significant Procedures: None  Significant Labs and Imaging:   Recent Labs Lab 01/06/17 2028  WBC 8.9  HGB 12.1  HCT 33.9*  PLT 282    Recent Labs Lab 01/06/17 2028  01/08/17 0407 01/08/17 0849 01/08/17 1227 01/08/17 1650 01/09/17 1710  NA 114*  < > 130* 132* 134* 135 138  K 3.7  < > 4.2 4.0 4.3 4.4 5.0  CL 76*  < > 96* 98* 100* 104 98  CO2 22  < > 26 23 22 24 23   GLUCOSE 85  < > 91 96 95 98 97  BUN 17  < >  16 14 13 14 12   CREATININE 1.19*  < > 1.12* 1.00 0.99 1.15* 0.88  CALCIUM 9.1  < > 9.2 9.1 9.1 9.1 9.8  ALKPHOS 64  --   --   --   --   --   --   AST 35  --   --   --   --   --   --   ALT 21  --   --   --   --   --   --    ALBUMIN 4.1  --   --   --   --   --   --   < > = values in this interval not displayed.  Ct Abdomen Pelvis W Contrast  Result Date: 01/07/2017 CLINICAL DATA:  Abdominal distention. Nausea for several months, increased over the last few days. EXAM: CT ABDOMEN AND PELVIS WITH CONTRAST TECHNIQUE: Multidetector CT imaging of the abdomen and pelvis was performed using the standard protocol following bolus administration of intravenous contrast. CONTRAST:  128mL ISOVUE-300 IOPAMIDOL (ISOVUE-300) INJECTION 61% COMPARISON:  None. FINDINGS: Lower chest: Moderate hiatal hernia. Mild adjacent compressive atelectasis. Lung bases are otherwise clear. Hepatobiliary: Small 8 mm subcapsular enhancement in the in segment 6 of the liver is incompletely characterize, possible flash filling hemangioma or 8 shunt. No suspicious hepatic lesions. Gallbladder physiologically distended, no calcified stone. No biliary dilatation. Pancreas: Parenchymal atrophy. No ductal dilatation or inflammation. Spleen: Normal in size without focal abnormality. Adrenals/Urinary Tract: No adrenal nodule. No hydronephrosis or perinephric edema. Mild renal cortical thinning, right greater than left. There are bilateral renal cysts. Mild right ureteral prominence, of doubtful clinical significance. Symmetric renal excretion on delayed phase imaging. Urinary bladder is physiologically distended. No bladder wall thickening. Stomach/Bowel: Moderate hiatal hernia. No small bowel dilatation or inflammation. There is fecalization of small bowel contents in the central abdomen. Small colonic stool burden. No significant diverticular disease. No bowel inflammation or obstruction. Appendix not visualized, surgically absent per report. Vascular/Lymphatic: Mild aortic and branch atherosclerosis. No aneurysm. No abdominopelvic adenopathy. Reproductive: Heterogeneous lesion in the left aspect of the uterus measures 7.5 cm, likely uterine fibroid. Probable additional  fibroid in the right uterine fundus measuring 4.8 cm, partially calcified. The left ovary is tentatively identified superior to the uterus containing a 3.0 cm low-density structure, less likely this may be a uterine fibroid. Right ovary is tentatively identified and normal. Other: No free air or ascites. No intra-abdominal abscess. Tiny fat containing umbilical hernia. Scattered posterior soft tissue calcifications. Musculoskeletal: Scoliosis and degenerative change in the spine. There are no acute or suspicious osseous abnormalities. IMPRESSION: 1. Globular heterogeneous uterus with at least two uterine fibroids. The left ovary is tentatively identified displaced superior to the uterus with a 3.0 cm low-density structure. This is a probably benign cyst, recommend nonemergent ultrasound characterization. The ovary may be difficult to visualize due to adjacent fibroids, in which case MRI could be considered. 2. Fecalization of small bowel contents suggest slow transit. No bowel obstruction or bowel inflammation. 3. Moderate hiatal hernia. 4. Mild renal cortical thinning and bilateral renal cysts. 5.  Aortic Atherosclerosis (ICD10-I70.0). Electronically Signed   By: Jeb Levering M.D.   On: 01/07/2017 01:33    Results/Tests Pending at Time of Discharge: None  Discharge Medications:  Allergies as of 01/08/2017   No Known Allergies     Medication List    STOP taking these medications   hydrochlorothiazide 25 MG tablet Commonly known as:  HYDRODIURIL  lisinopril 20 MG tablet Commonly known as:  PRINIVIL,ZESTRIL   meloxicam 7.5 MG tablet Commonly known as:  MOBIC   verapamil 180 MG 24 hr capsule Commonly known as:  VERELAN PM     TAKE these medications   atorvastatin 20 MG tablet Commonly known as:  LIPITOR TAKE 1 TABLET BY MOUTH EVERY DAY   cephALEXin 500 MG capsule Commonly known as:  KEFLEX Take 1 capsule (500 mg total) by mouth every 12 (twelve) hours.   metFORMIN 750 MG 24 hr  tablet Commonly known as:  GLUCOPHAGE XR Take 1 tablet (750 mg total) by mouth daily with breakfast. What changed:  when to take this   omeprazole 40 MG capsule Commonly known as:  PRILOSEC Take 1 capsule (40 mg total) by mouth daily.   ondansetron 4 MG tablet Commonly known as:  ZOFRAN Take 1 tablet (4 mg total) by mouth every 8 (eight) hours as needed for nausea or vomiting.       Discharge Instructions: Please refer to Patient Instructions section of EMR for full details.  Patient was counseled important signs and symptoms that should prompt return to medical care, changes in medications, dietary instructions, activity restrictions, and follow up appointments.   Follow-Up Appointments: Follow-up Information    Guadalupe Dawn, MD Follow up on 01/09/2017.   Specialty:  Family Medicine Why:  Please arrive at 3:30PM for your appointment Contact information: [address] [place] [address] [phone]           Bonnita Hollow, MD 01/12/2017, 4:01 PM PGY-1, [place]

## 2017-01-07 NOTE — ED Notes (Signed)
Pt completed meal tray

## 2017-01-07 NOTE — H&P (Signed)
[facility] Admission History and Physical Service Pager: [phone]  Patient name: Susan Keith Medical record number: [id number] Date of birth: January 19, 1946 Age: 71 y.o. Gender: female  Primary Care Provider: Lovenia Kim, MD Consultants: None Code Status: Full  Chief Complaint: nausea  Assessment and Plan: Stephen Turnbaugh is a 70 y.o. female presenting with nausea. PMH is significant for HTN, prediabetes, hyperlipidemia, hip pain.   Hyponatremia. Na 114 on admission. On 04/30, Na was 143. Pt is relatively asymptomatic and appears euvolemic. Unclear if this is acute or chronic (>48 hours). Patient with stable vital signs and no neurological symptoms. Labs indicate hypovolemia w/ hyponatremia, aki, and mild ketones in urine. Glucose normal. She does endorse nausea and dizziness, but these are chronic problems. Pt has recently had one episode of emesis and decreased PO intake over the past 4 days. Likely 2/2 multiple factors including HCTZ, poor po intake, and emesis. Consider further work up for alternate causes such as SIADH if Na does not improve.  - place in observation, attending Dr. Nori Riis - vitals per floor - up with assistance -strict I/Os -daily weights - replete sodium conservatively with NS @ 75 cc/hr - repeat 7 am BMP - am CBC, TSH - hold HCTZ  - consider urine studies, further work up if sodium fails to improve  Nausea. Chronic. Associated w/ dizziness and episodes of vertigo. Pt has had nausea for greater than 1 year. Worse in past 3 days. 1 episode of emesis. CT abdomen positive for fecalization of small bowel contents suggest slow transit. No bowel obstruction or bowel inflammation. Pt failed recent trial of Zofran at home. Possible BPPV vs. Pre-syncope.  -orthostatic vitals -Zofran 4mg  prn -treat constipation with Miralax and Senna  UTI. UA positive for leuk/moderate nitrates however patient denies dysuria. No leukocytosis, no fever.  -s/p  1 dose of Rocephin in ED -await urine culture -monitor for fevers -hold on further antibiotics for now  Elevated Cr. Cr mildly elevated at 1.19, only other value from 4/30 Cr elevated at 1.21. Questionable baseline for patient vs from poor PO intake. Patient also has used chronic NSAIDs due to hip pain.  -hold home Mobic  -mIVF  -monitor Cr  Constipation. Appears to be a chronic issue for patient. No home medications. -Miralax and Senna as above  HTN. Stable on admission 127/66. Taking HCTZ, verapamil, and lisinopril as outpatient. -hold HCTZ due to hyponatremia -continue ACE and CCB -monitor BP  Pre-Diabetes. Last A1C was 6.3 on 06/28. On metformin as outpatient -check fasting cbgs daily -hold home metformin  Hyperlipidemia. Stable -continue home atorvastatin  Hip pain. Chronic, stable. Recent MRI hip without findings to explain pain in hip except for DJD at L3-S1 -hold home Mobic -heating pad prn -Tylenol prn  Uterine Cyst. The left ovary is tentatively identified displaced superior to the uterus with a 3.0 cm low-density structure.  - Recommend outpatient ultrasound characterization.   FEN/GI: Regular diet Prophylaxis: LMWH  Disposition: observation, med-surg, pending Na correction.   History of Present Illness:  Susan Keith is a 71 y.o. female presenting with nausea.  Patient is with her daughter who is translating for her mother.   Per daughter, patient has been nauseated for 1 year but this has been progressively worsening. The nausea was so bad night of admission it prompted them to come to ED. Has been seeing her PCP for this. Had normal blood work in April. She threw up Friday which is abnormal for her. She had been constipated  for 3 days prior to that. Her daughter gave her some senna tea and the patient had a small BM that was "flaky", no blood in it, but was not a full bowel movement. The patient then tried to fast for 3 days to help with constipation. She had  tried to eat something small the night of admission but the nausea was very severe. She denies abdominal pain but endorses a feeling of fullness in her abdomen and points to her epigastric area when asked where.  She endorses urinary frequency. No pain with peeing or blood in the urine.   Review Of Systems: Per HPI with the following additions:   Review of Systems  Constitutional: Positive for malaise/fatigue. Negative for chills and fever.  Eyes: Negative for blurred vision and double vision.  Respiratory: Negative for cough and shortness of breath.   Cardiovascular: Negative for chest pain.  Gastrointestinal: Positive for constipation and nausea. Negative for abdominal pain and blood in stool.  Genitourinary: Positive for urgency. Negative for dysuria.  Neurological: Positive for dizziness and weakness. Negative for tingling and focal weakness.  Psychiatric/Behavioral: Negative for substance abuse and suicidal ideas.    Patient Active Problem List   Diagnosis Date Noted  . Hyponatremia 01/07/2017  . Constipation   . Acute cystitis without hematuria   . Essential hypertension   . Nausea without vomiting 12/29/2016  . Right hip pain 09/23/2016  . Back pain 09/09/2016    Past Medical History: Past Medical History:  Diagnosis Date  . GERD (gastroesophageal reflux disease)   . Hypertension     Past Surgical History: Past Surgical History:  Procedure Laterality Date  . APPENDECTOMY      Social History: Social History  Substance Use Topics  . Smoking status: Current Every Day Smoker    Packs/day: 0.25    Types: Cigarettes  . Smokeless tobacco: Never Used  . Alcohol use No   Additional social history: lives with husband, retired. Smokes cigarettes 4-5 cigarettes a day for "all her life". No drugs.   Please also refer to relevant sections of EMR.  Family History: No family history on file. Father had DM, HTN.   Allergies and Medications: No Known Allergies No  current facility-administered medications on file prior to encounter.    Current Outpatient Prescriptions on File Prior to Encounter  Medication Sig Dispense Refill  . atorvastatin (LIPITOR) 20 MG tablet TAKE 1 TABLET BY MOUTH EVERY DAY 30 tablet 0  . hydrochlorothiazide (HYDRODIURIL) 25 MG tablet TAKE 1 TABLET BY MOUTH EVERY DAY 30 tablet 0  . lisinopril (PRINIVIL,ZESTRIL) 20 MG tablet TAKE 1 TABLET BY MOUTH EVERY DAY 30 tablet 0  . meloxicam (MOBIC) 7.5 MG tablet Take 1 tablet (7.5 mg total) by mouth daily. 30 tablet 0  . metFORMIN (GLUCOPHAGE XR) 750 MG 24 hr tablet Take 1 tablet (750 mg total) by mouth daily with breakfast. (Patient taking differently: Take 750 mg by mouth at bedtime. ) 30 tablet 1  . omeprazole (PRILOSEC) 40 MG capsule Take 1 capsule (40 mg total) by mouth daily. 30 capsule 3  . ondansetron (ZOFRAN) 4 MG tablet Take 1 tablet (4 mg total) by mouth every 8 (eight) hours as needed for nausea or vomiting. 20 tablet 0  . verapamil (VERELAN PM) 180 MG 24 hr capsule Take 1 capsule (180 mg total) by mouth daily. 30 capsule 2    Objective: BP 127/66   Pulse 72   Temp 99 F (37.2 C) (Oral)   Resp 16  SpO2 98%  Exam: General: NAD, resting comfortably Eyes: PERRLA, EOMI ENTM: moist mucus membranes, no mucosal lesions  Neck: ROM Cardiovascular: rr,r, no mrg Respiratory: CTAB, normal work of breathing Gastrointestinal: nl bowel sounds, mildly distended, non-tender to palpation MSK: 5/5 strength in upper and lower extremities bilaterally, no edema in lower extremities, 2+ radial and  Neuro: CN II-XII intact, strength 5/5 in upper and lower extremities bilaterally with sensation in tact throughout.  Psych: appropriate affect  Labs and Imaging: CBC BMET   Recent Labs Lab 01/06/17 2028  WBC 8.9  HGB 12.1  HCT 33.9*  PLT 282    Recent Labs Lab 01/06/17 2028  NA 114*  K 3.7  CL 76*  CO2 22  BUN 17  CREATININE 1.19*  GLUCOSE 85  CALCIUM 9.1     Ct Abdomen  Pelvis W Contrast  Result Date: 01/07/2017 CLINICAL DATA:  Abdominal distention. Nausea for several months, increased over the last few days. EXAM: CT ABDOMEN AND PELVIS WITH CONTRAST TECHNIQUE: Multidetector CT imaging of the abdomen and pelvis was performed using the standard protocol following bolus administration of intravenous contrast. CONTRAST:  14mL ISOVUE-300 IOPAMIDOL (ISOVUE-300) INJECTION 61% COMPARISON:  None. FINDINGS: Lower chest: Moderate hiatal hernia. Mild adjacent compressive atelectasis. Lung bases are otherwise clear. Hepatobiliary: Small 8 mm subcapsular enhancement in the in segment 6 of the liver is incompletely characterize, possible flash filling hemangioma or 8 shunt. No suspicious hepatic lesions. Gallbladder physiologically distended, no calcified stone. No biliary dilatation. Pancreas: Parenchymal atrophy. No ductal dilatation or inflammation. Spleen: Normal in size without focal abnormality. Adrenals/Urinary Tract: No adrenal nodule. No hydronephrosis or perinephric edema. Mild renal cortical thinning, right greater than left. There are bilateral renal cysts. Mild right ureteral prominence, of doubtful clinical significance. Symmetric renal excretion on delayed phase imaging. Urinary bladder is physiologically distended. No bladder wall thickening. Stomach/Bowel: Moderate hiatal hernia. No small bowel dilatation or inflammation. There is fecalization of small bowel contents in the central abdomen. Small colonic stool burden. No significant diverticular disease. No bowel inflammation or obstruction. Appendix not visualized, surgically absent per report. Vascular/Lymphatic: Mild aortic and branch atherosclerosis. No aneurysm. No abdominopelvic adenopathy. Reproductive: Heterogeneous lesion in the left aspect of the uterus measures 7.5 cm, likely uterine fibroid. Probable additional fibroid in the right uterine fundus measuring 4.8 cm, partially calcified. The left ovary is tentatively  identified superior to the uterus containing a 3.0 cm low-density structure, less likely this may be a uterine fibroid. Right ovary is tentatively identified and normal. Other: No free air or ascites. No intra-abdominal abscess. Tiny fat containing umbilical hernia. Scattered posterior soft tissue calcifications. Musculoskeletal: Scoliosis and degenerative change in the spine. There are no acute or suspicious osseous abnormalities. IMPRESSION: 1. Globular heterogeneous uterus with at least two uterine fibroids. The left ovary is tentatively identified displaced superior to the uterus with a 3.0 cm low-density structure. This is a probably benign cyst, recommend nonemergent ultrasound characterization. The ovary may be difficult to visualize due to adjacent fibroids, in which case MRI could be considered. 2. Fecalization of small bowel contents suggest slow transit. No bowel obstruction or bowel inflammation. 3. Moderate hiatal hernia. 4. Mild renal cortical thinning and bilateral renal cysts. 5.  Aortic Atherosclerosis (ICD10-I70.0). Electronically Signed   By: Jeb Levering M.D.   On: 01/07/2017 01:33     Bonnita Hollow, MD 01/07/2017, 3:55 AM PGY-1, [place] Intern pager: [phone], text pages welcome   [facility] Upper-Level Resident Addendum  I  have independently interviewed and examined the patient. I have discussed the above with the original author and agree with their documentation. My edits for correction/addition/clarification are in pink.Please see also any attending notes.   Lucila Maine, DO PGY-2, [facility] Service pager: [phone] (text pages welcome through [facility])

## 2017-01-07 NOTE — Progress Notes (Signed)
Susan Keith is a 71 y.o. female patient admitted from ED awake, alert - oriented  X 4 - no acute distress noted.  VSS - Blood pressure 133/67, pulse 68, temperature 99 F (37.2 C), temperature source Oral, resp. rate 16, SpO2 97 %.    IV in place, occlusive dsg intact without redness.  Orientation to room, and floor completed with information packet given to patient/family.   Admission INP armband ID verified with patient/family, and in place.   SR up x 2, fall assessment complete, with patient and family able to verbalize understanding of risk associated with falls, and verbalized understanding to call nsg before up out of bed.  Call light within reach, patient able to voice, and demonstrate understanding.  Skin, clean-dry- intact without evidence of bruising, or skin tears.   No evidence of skin break down noted on exam.     Will cont to eval and treat per MD orders.  [name], RN 01/07/2017 3:20 PM

## 2017-01-07 NOTE — Progress Notes (Signed)
Received report on pt.

## 2017-01-07 NOTE — ED Notes (Signed)
Pt eating meal tray 

## 2017-01-07 NOTE — Progress Notes (Signed)
Spoke with patient and patient's daughter regarding her health concerns.  Their primary concern is her nausea.  This has been a chronic issue for her, and they are anxious to find a cause for it. They know that her sodium was wnl in May when she was having nausea, so they do not believe it is connected to her hyponatremia.  They deny that she has had any weight loss.    Second, they are concerned about her urinary frequency and frequent thirst.  Although she was diagnosed with a UTI in the ED during this admission, she has had frequency for a long time.  I explained that this could be due to the HCTZ or urinary incontinence commonly found in older women.  They are hoping that some cause can be found regarding this symptom and want to know if there are any medications that can help with this.  I mentioned that she may need to see a urologist to definitively come to a diagnosis, and they are amenable to a urologist referral in the future.  She continues to experience constipation and heartburn.  They would be interested in medications for this on discharge.  Of note, she will be going to [place] this Monday, 8/20, and will return in 2 months.  They are hoping that tests can be done before she leaves, as she does not have plans to see a doctor in [place].  They understand that she would not likely be able to see a urologist before she leaves.  I told them that I would raise their concerns with my team and that we would address them if appropriate while she is getting her sodium correct inpatient.  If they are determined to be outpatient issues, they can be addressed at her upcoming appointment on 8/16 at [facility] clinic.  I will inform the day team of this information tomorrow morning.

## 2017-01-07 NOTE — Progress Notes (Signed)
Family Medicine Teaching Service Daily Progress Note Intern Pager: [phone]  Patient name: Susan Keith Medical record number: [id number] Date of birth: [birth year] Age: 71 y.o. Gender: female  Primary Care Provider: Lovenia Kim, MD Consultants: None Code Status: Full  Assessment and Plan: Susan Keith is a 71 y.o. female presenting with nausea. PMH is significant for HTN, prediabetes, hyperlipidemia, hip pain.   Hyponatremia. Na 114 on admission. On 04/30, Na was 143. Pt is relatively asymptomatic and appears euvolemic. Unclear if this is acute or chronic (>48 hours). Patient with stable vital signs and no neurological symptoms. Labs indicate hypovolemia w/ hyponatremia, aki, and mild ketones in urine. Glucose normal. She does endorse nausea and dizziness, but these are chronic problems. Pt has recently had one episode of emesis and decreased PO intake over the past 4 days. Likely 2/2 multiple factors including HCTZ, poor po intake, and emesis. Consider further work up for alternate causes such as SIADH if Na does not improve.  - place in observation, attending Dr. Nori Riis - requires [place] translator, daughter can interpret - vitals per floor - up with assistance -strict I/Os -daily weights - replete sodium conservatively with NS @ 75 cc/hr, adjust rate to no more that 2 mEq/4hr -TSH low, consider free T3/T4 f/u labs if not improving w/ NS - repeat 7 am BMP - am CBC, TSH - hold HCTZ  - consider urine studies, further work up if sodium fails to improve  Nausea. Chronic. Associated w/ dizziness and episodes of vertigo. Pt has had nausea for greater than 1 year. Worse in past 3 days. 1 episode of emesis. CT abdomen positive for fecalization of small bowel contents suggest slow transit. No bowel obstruction or bowel inflammation. Pt failed recent trial of Zofran at home. Possible BPPV vs. Pre-syncope.  -orthostatic vitals -Zofran 4mg  prn -treat constipation with Miralax and  Senna  UTI. UA positive for leuk/moderate nitrates however patient denies dysuria. No leukocytosis, no fever.  -s/p 1 dose of Rocephin in ED -await urine culture -monitor for fevers -hold on further antibiotics for now  Elevated Cr. Cr mildly elevated at 1.19, only other value from 4/30 Cr elevated at 1.21. Questionable baseline for patient vs from poor PO intake. Patient also has used chronic NSAIDs due to hip pain.  -hold home Mobic and lisinopril -mIVF  -monitor Cr  Constipation. Appears to be a chronic issue for patient. No home medications. -Miralax and Senna as above  HTN. Stable on admission 127/66. Taking HCTZ, verapamil, and lisinopril as outpatient. -hold HCTZ due to hyponatremia -hold lisinopril due to AKI -cont verapamil, consider switching if constipation does not improve -monitor BP  Pre-Diabetes. Last A1C was 6.3 on 06/28. On metformin as outpatient -check fasting cbgs daily -hold home metformin  Hyperlipidemia. Stable -continue home atorvastatin  Hip pain. Chronic, stable. Recent MRI hip without findings to explain pain in hip except for DJD at L3-S1 -hold home Mobic -heating pad prn -Tylenol prn  Uterine Cyst. The left ovary is tentatively identified displaced superior to the uterus with a 3.0 cm low-density structure.  - Recommend outpatient ultrasound characterization.   FEN/GI: Regular diet Prophylaxis: LMWH  Disposition: observation, med-surg, pending Na correction.   Subjective:  Pt states she is feeling better. Her nausae and bloating has resolved. She denies and dizziness, abdominal pain. Objective: Temp:  [99 F (37.2 C)] 99 F (37.2 C) (08/13 2009) Pulse Rate:  [65-84] 84 (08/14 0630) Resp:  [16-18] 16 (08/14 0413) BP: (97-169)/(52-80) 122/52 (08/14 0630) SpO2:  [  92 %-98 %] 98 % (08/14 0630) Physical Exam: General: NAD, resting comfortably Cardiovascular: rrr, no mrg Respiratory: CTAB, normal work of  breathing Gastrointestinal: nl bowel sounds, mildly distended, non-tender to palpation MSK: 5/5 strength in upper extremities bilaterally, no edema in lower extremities,  Psych: appropriate affect  Laboratory:  Recent Labs Lab 01/06/17 2028  WBC 8.9  HGB 12.1  HCT 33.9*  PLT 282    Recent Labs Lab 01/06/17 2028  NA 114*  K 3.7  CL 76*  CO2 22  BUN 17  CREATININE 1.19*  CALCIUM 9.1  PROT 6.9  BILITOT 1.6*  ALKPHOS 64  ALT 21  AST 35  GLUCOSE 85      Imaging/Diagnostic Tests: Ct Abdomen Pelvis W Contrast  Result Date: 01/07/2017 CLINICAL DATA:  Abdominal distention. Nausea for several months, increased over the last few days. EXAM: CT ABDOMEN AND PELVIS WITH CONTRAST TECHNIQUE: Multidetector CT imaging of the abdomen and pelvis was performed using the standard protocol following bolus administration of intravenous contrast. CONTRAST:  131mL ISOVUE-300 IOPAMIDOL (ISOVUE-300) INJECTION 61% COMPARISON:  None. FINDINGS: Lower chest: Moderate hiatal hernia. Mild adjacent compressive atelectasis. Lung bases are otherwise clear. Hepatobiliary: Small 8 mm subcapsular enhancement in the in segment 6 of the liver is incompletely characterize, possible flash filling hemangioma or 8 shunt. No suspicious hepatic lesions. Gallbladder physiologically distended, no calcified stone. No biliary dilatation. Pancreas: Parenchymal atrophy. No ductal dilatation or inflammation. Spleen: Normal in size without focal abnormality. Adrenals/Urinary Tract: No adrenal nodule. No hydronephrosis or perinephric edema. Mild renal cortical thinning, right greater than left. There are bilateral renal cysts. Mild right ureteral prominence, of doubtful clinical significance. Symmetric renal excretion on delayed phase imaging. Urinary bladder is physiologically distended. No bladder wall thickening. Stomach/Bowel: Moderate hiatal hernia. No small bowel dilatation or inflammation. There is fecalization of small bowel  contents in the central abdomen. Small colonic stool burden. No significant diverticular disease. No bowel inflammation or obstruction. Appendix not visualized, surgically absent per report. Vascular/Lymphatic: Mild aortic and branch atherosclerosis. No aneurysm. No abdominopelvic adenopathy. Reproductive: Heterogeneous lesion in the left aspect of the uterus measures 7.5 cm, likely uterine fibroid. Probable additional fibroid in the right uterine fundus measuring 4.8 cm, partially calcified. The left ovary is tentatively identified superior to the uterus containing a 3.0 cm low-density structure, less likely this may be a uterine fibroid. Right ovary is tentatively identified and normal. Other: No free air or ascites. No intra-abdominal abscess. Tiny fat containing umbilical hernia. Scattered posterior soft tissue calcifications. Musculoskeletal: Scoliosis and degenerative change in the spine. There are no acute or suspicious osseous abnormalities. IMPRESSION: 1. Globular heterogeneous uterus with at least two uterine fibroids. The left ovary is tentatively identified displaced superior to the uterus with a 3.0 cm low-density structure. This is a probably benign cyst, recommend nonemergent ultrasound characterization. The ovary may be difficult to visualize due to adjacent fibroids, in which case MRI could be considered. 2. Fecalization of small bowel contents suggest slow transit. No bowel obstruction or bowel inflammation. 3. Moderate hiatal hernia. 4. Mild renal cortical thinning and bilateral renal cysts. 5.  Aortic Atherosclerosis (ICD10-I70.0). Electronically Signed   By: Jeb Levering M.D.   On: 01/07/2017 01:33    Bonnita Hollow, MD 01/07/2017, 7:23 AM PGY-1, [place] Intern pager: [phone], text pages welcome

## 2017-01-07 NOTE — ED Notes (Signed)
Pt walked to RR with stand by assistance. Pt ambulate very steady

## 2017-01-08 DIAGNOSIS — N3000 Acute cystitis without hematuria: Secondary | ICD-10-CM | POA: Diagnosis not present

## 2017-01-08 DIAGNOSIS — E861 Hypovolemia: Secondary | ICD-10-CM | POA: Diagnosis not present

## 2017-01-08 DIAGNOSIS — N179 Acute kidney failure, unspecified: Secondary | ICD-10-CM | POA: Diagnosis not present

## 2017-01-08 DIAGNOSIS — I10 Essential (primary) hypertension: Secondary | ICD-10-CM | POA: Diagnosis not present

## 2017-01-08 DIAGNOSIS — E871 Hypo-osmolality and hyponatremia: Secondary | ICD-10-CM | POA: Diagnosis not present

## 2017-01-08 DIAGNOSIS — R7303 Prediabetes: Secondary | ICD-10-CM | POA: Diagnosis not present

## 2017-01-08 LAB — BASIC METABOLIC PANEL
ANION GAP: 12 (ref 5–15)
ANION GAP: 8 (ref 5–15)
ANION GAP: 11 (ref 5–15)
ANION GAP: 12 (ref 5–15)
ANION GAP: 7 (ref 5–15)
BUN: 16 mg/dL (ref 6–20)
BUN: 16 mg/dL (ref 6–20)
BUN: 14 mg/dL (ref 6–20)
BUN: 13 mg/dL (ref 6–20)
BUN: 14 mg/dL (ref 6–20)
CALCIUM: 9.1 mg/dL (ref 8.9–10.3)
CALCIUM: 9.1 mg/dL (ref 8.9–10.3)
CALCIUM: 9.1 mg/dL (ref 8.9–10.3)
CHLORIDE: 96 mmol/L — AB (ref 101–111)
CHLORIDE: 98 mmol/L — AB (ref 101–111)
CO2: 19 mmol/L — AB (ref 22–32)
CO2: 26 mmol/L (ref 22–32)
CO2: 23 mmol/L (ref 22–32)
CO2: 22 mmol/L (ref 22–32)
CO2: 24 mmol/L (ref 22–32)
Calcium: 9 mg/dL (ref 8.9–10.3)
Calcium: 9.2 mg/dL (ref 8.9–10.3)
Chloride: 97 mmol/L — ABNORMAL LOW (ref 101–111)
Chloride: 100 mmol/L — ABNORMAL LOW (ref 101–111)
Chloride: 104 mmol/L (ref 101–111)
Creatinine, Ser: 1.06 mg/dL — ABNORMAL HIGH (ref 0.44–1.00)
Creatinine, Ser: 1.12 mg/dL — ABNORMAL HIGH (ref 0.44–1.00)
Creatinine, Ser: 1 mg/dL (ref 0.44–1.00)
Creatinine, Ser: 0.99 mg/dL (ref 0.44–1.00)
Creatinine, Ser: 1.15 mg/dL — ABNORMAL HIGH (ref 0.44–1.00)
GFR calc Af Amer: >60 mL/min (ref >60)
GFR calc Af Amer: 57 mL/min — ABNORMAL LOW (ref >60)
GFR calc Af Amer: >60 mL/min (ref >60)
GFR calc non Af Amer: 53 mL/min — ABNORMAL LOW (ref >60)
GFR calc non Af Amer: 49 mL/min — ABNORMAL LOW (ref >60)
GFR, EST AFRICAN AMERICAN: 55 mL/min — AB (ref >60)
GFR, EST NON AFRICAN AMERICAN: 57 mL/min — AB (ref >60)
GFR, EST NON AFRICAN AMERICAN: 57 mL/min — AB (ref >60)
GFR, EST NON AFRICAN AMERICAN: 48 mL/min — AB (ref >60)
GLUCOSE: 94 mg/dL (ref 65–99)
GLUCOSE: 91 mg/dL (ref 65–99)
GLUCOSE: 96 mg/dL (ref 65–99)
Glucose, Bld: 95 mg/dL (ref 65–99)
Glucose, Bld: 98 mg/dL (ref 65–99)
POTASSIUM: 3.8 mmol/L (ref 3.5–5.1)
POTASSIUM: 4.2 mmol/L (ref 3.5–5.1)
POTASSIUM: 4 mmol/L (ref 3.5–5.1)
POTASSIUM: 4.3 mmol/L (ref 3.5–5.1)
POTASSIUM: 4.4 mmol/L (ref 3.5–5.1)
Sodium: 128 mmol/L — ABNORMAL LOW (ref 135–145)
Sodium: 130 mmol/L — ABNORMAL LOW (ref 135–145)
Sodium: 132 mmol/L — ABNORMAL LOW (ref 135–145)
Sodium: 134 mmol/L — ABNORMAL LOW (ref 135–145)
Sodium: 135 mmol/L (ref 135–145)

## 2017-01-08 LAB — GLUCOSE, CAPILLARY: GLUCOSE-CAPILLARY: 100 mg/dL — AB (ref 65–99)

## 2017-01-08 MED ORDER — CEPHALEXIN 500 MG PO CAPS: 500.0000 mg | ORAL_CAPSULE | Freq: Two times a day (BID) | ORAL | 0 refills | Status: AC

## 2017-01-08 MED ORDER — CEPHALEXIN 500 MG PO CAPS
500.0000 mg | ORAL_CAPSULE | Freq: Two times a day (BID) | ORAL | Status: DC
  Administered 2017-01-08: 500 mg via ORAL
  Filled 2017-01-08: qty 1

## 2017-01-08 MED ORDER — LISINOPRIL 20 MG PO TABS
20.0000 mg | ORAL_TABLET | Freq: Every day | ORAL | Status: DC
  Filled 2017-01-08: qty 1

## 2017-01-08 NOTE — Progress Notes (Signed)
Susan Keith to be D/C'd Home per MD order.  Discussed with the patient and all questions fully answered.  VSS, Skin clean, dry and intact without evidence of skin break down, no evidence of skin tears noted. IV catheter discontinued intact. Site without signs and symptoms of complications. Dressing and pressure applied.  An After Visit Summary was printed and given to the patient. Patient received prescription.  D/c education completed with patient/family including follow up instructions, medication list, d/c activities limitations if indicated, with other d/c instructions as indicated by MD - patient able to verbalize understanding, all questions fully answered.   Patient instructed to return to ED, call 911, or call MD for any changes in condition.   Patient waiting on daughter to arrive to take her home.  [name] 01/08/2017 6:51 PM

## 2017-01-08 NOTE — Discharge Instructions (Signed)
You were admitted to the hospital for low sodium, nausea, and fatigue. We have given you IV fluids which has made you sodium normal. We have also been giving you antibiotics for a potential urinary tract infection. Your sodium may have been low due to no eating and taking the hydrochlorothiazide. We have stopped all of your blood pressure medications for now because you blood pressure is normal without them. Also because the hydrochlorothiazide is a potential reason why your sodium was low.   Please taking taking the Keflex antibiotic as prescribed. Continue to drink water and keep hydrated.   We will see you tomorrow in the clinic for hospital follow up.   It was a pleasure taking care of you, all the best!

## 2017-01-08 NOTE — Progress Notes (Signed)
Family Medicine Teaching Service Daily Progress Note Intern Pager: [phone]  Patient name: Susan Keith Medical record number: [id number] Date of birth: 09/29/1945 Age: 71 y.o. Gender: female  Primary Care Provider: Lovenia Kim, MD Consultants: None Code Status: Full  Assessment and Plan: Susan Keith is a 71 y.o. female presenting with nausea. PMH is significant for HTN, prediabetes, hyperlipidemia, hip pain.   Hyponatremia. Improving, Na 132 today. Na 114 on admission. On 04/30, Na was 143. Pt is relatively asymptomatic and appears euvolemic. Unclear if this is acute or chronic (>48 hours). Patient with stable vital signs and no neurological symptoms. Labs indicate hypovolemia w/ hyponatremia, aki, and mild ketones in urine. Glucose normal. She does endorse nausea and dizziness, but these are chronic problems. Pt has recently had one episode of emesis and decreased PO intake over the past 4 days. Likely 2/2 multiple factors including HCTZ, poor po intake, and emesis. Consider further work up for alternate causes such as SIADH if Na does not improve.  - requires [place], daughter can interpret - vitals per floor - up with assistance -strict I/Os - daily weights - replete sodium conservatively with NS @ 75 cc/hr, adjust rate to no more that 2 mEq/4hr -TSH low, consider free T3/T4 f/u labs in outpatient - BMP q4h - hold HCTZ    Nausea. Improved. Chronic. Associated w/ dizziness and episodes of vertigo. Pt has had nausea for greater than 1 year. Worse 3 days prior to admission. 1 episode of emesis. CT abdomen positive for fecalization of small bowel contents suggest slow transit. No bowel obstruction or bowel inflammation. Pt failed recent trial of Zofran at home. Normal orthostatics -Possible BPPV vs. Pre-syncope.  --stop verapamil, see if constipation improved in outpatient -Zofran 4mg  prn -restart outpatient zophran -continue with Miralax and Senna - Consider  outpatient GI eval for slow transit eval  UTI, stable. Denies dysuria, hematuria, increased urinary urgency/frequency. UA positive for leuk/moderate nitrates however patient denies dysuria. No leukocytosis, no fever.  -s/p 1 dose of Rocephin in ED. Urine culture: >=100,000 COLONIES E. Coli -start keflex 500mg  BID for 7 days -Urine susceptibilities pending  Elevated Cr, stable. Cr mildly elevated at 1.12, only other value before admission from 4/30 Cr elevated at 1.21. Questionable baseline for patient. Patient also has used chronic NSAIDs due to hip pain.  -hold home Mobic, restart home Mobic with discharge -mIVF   Constipation.  Appears to be a chronic issue for patient. No home medications. CT abdomen positive for fecalization of small bowel contents suggest slow transit. No bowel movement in hospital -Miralax and Senna as above -d/c Virapamil as it may cause constipation  HTN. Stable. Mild hypotension at 102/54 on admission 127/66. Taking HCTZ, verapamil, and lisinopril as outpatient. -hold HCTZ due to hyponatremia -lisinopril held for hypotension, consider restarting in outpatient -stop verapamil, see if constipation improves -follow BP in outpatient   Pre-Diabetes. Last A1C was 6.3 on 06/28. On metformin as outpatient -check fasting cbgs daily -hold home metformin  Hyperlipidemia. Stable -continue home atorvastatin  Hip pain. Chronic, stable. Recent MRI hip without findings to explain pain in hip except for DJD at L3-S1 - restart home Mobic as outpatient -heating pad prn -Tylenol prn  Uterine Cyst. The left ovary is tentatively identified displaced superior to the uterus with a 3.0 cm low-density structure.  - Recommend outpatient ultrasound characterization.   FEN/GI: Regular diet Prophylaxis: LMWH  Disposition: likely d/c home today, pending Na correction.   Subjective:  Patient is feeling better. She  is not feeling any nausea or bloating.    Objective: Temp:  [98.2 F (36.8 C)-98.4 F (36.9 C)] 98.4 F (36.9 C) (08/15 0606) Pulse Rate:  [54-78] 54 (08/15 0606) Resp:  [16-20] 16 (08/14 2123) BP: (96-133)/(55-73) 127/58 (08/15 0606) SpO2:  [91 %-99 %] 98 % (08/15 0606) Weight:  [153 lb 6.4 oz (69.6 kg)] 153 lb 6.4 oz (69.6 kg) (08/14 1522) Physical Exam: General: NAD, resting comfortably Cardiovascular: rrr, no mrg Respiratory: CTAB, normal work of breathing Gastrointestinal: nl bowel sounds, mildly distended, non-tender to palpation MSK: 5/5 strength in upper extremities and lower extremities bilaterally,  Psych: appropriate affect  Laboratory:  Recent Labs Lab 01/06/17 2028  WBC 8.9  HGB 12.1  HCT 33.9*  PLT 282    Recent Labs Lab 01/06/17 2028  01/07/17 2106 01/08/17 0129 01/08/17 0407  NA 114*  < > 122* 128* 130*  K 3.7  < > 3.7 3.8 4.2  CL 76*  < > 90* 97* 96*  CO2 22  < > 24 19* 26  BUN 17  < > 17 16 16   CREATININE 1.19*  < > 1.19* 1.06* 1.12*  CALCIUM 9.1  < > 8.9 9.0 9.2  PROT 6.9  --   --   --   --   BILITOT 1.6*  --   --   --   --   ALKPHOS 64  --   --   --   --   ALT 21  --   --   --   --   AST 35  --   --   --   --   GLUCOSE 85  < > 94 94 91  < > = values in this interval not displayed.    Imaging/Diagnostic Tests: Ct Abdomen Pelvis W Contrast  Result Date: 01/07/2017 CLINICAL DATA:  Abdominal distention. Nausea for several months, increased over the last few days. EXAM: CT ABDOMEN AND PELVIS WITH CONTRAST TECHNIQUE: Multidetector CT imaging of the abdomen and pelvis was performed using the standard protocol following bolus administration of intravenous contrast. CONTRAST:  165mL ISOVUE-300 IOPAMIDOL (ISOVUE-300) INJECTION 61% COMPARISON:  None. FINDINGS: Lower chest: Moderate hiatal hernia. Mild adjacent compressive atelectasis. Lung bases are otherwise clear. Hepatobiliary: Small 8 mm subcapsular enhancement in the in segment 6 of the liver is incompletely characterize, possible  flash filling hemangioma or 8 shunt. No suspicious hepatic lesions. Gallbladder physiologically distended, no calcified stone. No biliary dilatation. Pancreas: Parenchymal atrophy. No ductal dilatation or inflammation. Spleen: Normal in size without focal abnormality. Adrenals/Urinary Tract: No adrenal nodule. No hydronephrosis or perinephric edema. Mild renal cortical thinning, right greater than left. There are bilateral renal cysts. Mild right ureteral prominence, of doubtful clinical significance. Symmetric renal excretion on delayed phase imaging. Urinary bladder is physiologically distended. No bladder wall thickening. Stomach/Bowel: Moderate hiatal hernia. No small bowel dilatation or inflammation. There is fecalization of small bowel contents in the central abdomen. Small colonic stool burden. No significant diverticular disease. No bowel inflammation or obstruction. Appendix not visualized, surgically absent per report. Vascular/Lymphatic: Mild aortic and branch atherosclerosis. No aneurysm. No abdominopelvic adenopathy. Reproductive: Heterogeneous lesion in the left aspect of the uterus measures 7.5 cm, likely uterine fibroid. Probable additional fibroid in the right uterine fundus measuring 4.8 cm, partially calcified. The left ovary is tentatively identified superior to the uterus containing a 3.0 cm low-density structure, less likely this may be a uterine fibroid. Right ovary is tentatively identified and normal. Other: No free air or  ascites. No intra-abdominal abscess. Tiny fat containing umbilical hernia. Scattered posterior soft tissue calcifications. Musculoskeletal: Scoliosis and degenerative change in the spine. There are no acute or suspicious osseous abnormalities. IMPRESSION: 1. Globular heterogeneous uterus with at least two uterine fibroids. The left ovary is tentatively identified displaced superior to the uterus with a 3.0 cm low-density structure. This is a probably benign cyst, recommend  nonemergent ultrasound characterization. The ovary may be difficult to visualize due to adjacent fibroids, in which case MRI could be considered. 2. Fecalization of small bowel contents suggest slow transit. No bowel obstruction or bowel inflammation. 3. Moderate hiatal hernia. 4. Mild renal cortical thinning and bilateral renal cysts. 5.  Aortic Atherosclerosis (ICD10-I70.0). Electronically Signed   By: Jeb Levering M.D.   On: 01/07/2017 01:33    Bonnita Hollow, MD 01/08/2017, 6:45 AM PGY-1, [place] Intern pager: [phone], text pages welcome

## 2017-01-08 NOTE — Progress Notes (Signed)
[employer] video interpreting used to discuss discharge orders.

## 2017-01-09 ENCOUNTER — Ambulatory Visit (INDEPENDENT_AMBULATORY_CARE_PROVIDER_SITE_OTHER): Payer: Medicare Other | Admitting: Family Medicine

## 2017-01-09 ENCOUNTER — Encounter: Payer: Self-pay | Admitting: Family Medicine

## 2017-01-09 VITALS — BP 138/82 | HR 67 | Temp 97.7°F | Wt 152.0 lb

## 2017-01-09 DIAGNOSIS — N83202 Unspecified ovarian cyst, left side: Secondary | ICD-10-CM | POA: Diagnosis not present

## 2017-01-09 DIAGNOSIS — N3941 Urge incontinence: Secondary | ICD-10-CM | POA: Diagnosis not present

## 2017-01-09 DIAGNOSIS — R140 Abdominal distension (gaseous): Secondary | ICD-10-CM

## 2017-01-09 DIAGNOSIS — E871 Hypo-osmolality and hyponatremia: Secondary | ICD-10-CM | POA: Diagnosis not present

## 2017-01-09 LAB — URINE CULTURE: Culture: >=100000 — AB

## 2017-01-09 MED ORDER — LISINOPRIL 20 MG PO TABS: 20.0000 mg | ORAL_TABLET | Freq: Every day | ORAL | 3 refills | Status: DC

## 2017-01-09 NOTE — Patient Instructions (Addendum)
At today's visit we discussed low sodium, urinary frequency, uterine fibroids, hypertension. We decided to stop your hydrochlorothiazide until you return from [place] in October Please resume your lisinopril, you will be given enough for 2 months from your pharmacy Please plan to follow up with dr. Reesa Chew regarding bloating, and possible slow colonic transit when you return from your trip Regarding your incontinence please follow up with dr. Reesa Chew after your trip, it is possible you might need to be referred to urology in the future

## 2017-01-10 ENCOUNTER — Telehealth: Payer: Self-pay | Admitting: Family Medicine

## 2017-01-10 LAB — BASIC METABOLIC PANEL
BUN/Creatinine Ratio: 14 (ref 12–28)
BUN: 12 mg/dL (ref 8–27)
CALCIUM: 9.8 mg/dL (ref 8.7–10.3)
CO2: 23 mmol/L (ref 20–29)
Chloride: 98 mmol/L (ref 96–106)
Creatinine, Ser: 0.88 mg/dL (ref 0.57–1.00)
GFR, EST AFRICAN AMERICAN: 78 mL/min/{1.73_m2} (ref >59)
GFR, EST NON AFRICAN AMERICAN: 68 mL/min/{1.73_m2} (ref >59)
Glucose: 97 mg/dL (ref 65–99)
Potassium: 5 mmol/L (ref 3.5–5.2)
Sodium: 138 mmol/L (ref 134–144)

## 2017-01-10 NOTE — Telephone Encounter (Signed)
Family Medicine Telephone Note  Called and informed patient's daughter about results of her BMP per patient request. Informed patient that sodium was 138 which is firmly in the normal range. Answered all questions from patient's daughter. Patient to schedule follow up appointment with Dr. Reesa Chew when she returns from [place] in October.  Guadalupe Dawn MD PGY-1 Family Medicine Residency

## 2017-01-30 ENCOUNTER — Other Ambulatory Visit: Payer: Self-pay | Admitting: Family Medicine

## 2017-01-31 NOTE — Progress Notes (Signed)
HPI 71 year old female who presents as a hospital follow-up. She was recently admitted on 01/06/2017 due to nausea, constipation, decreased PO intake and dizziness. Patient has been intermittently nausea for last year, but felt it was much worse at presentation. At admission she was found to have sodium of 114. She was admitted for IV fluid hydration and treatment of her hyponatremia. She was discharged on 8/14 with very close follow up as the patient is getting ready to leave for a 2 month trip to [place] on 8/16. Issues for follow up included sodium check, chronic constipation, ovarian cyst, chronic nausea.  Sodium was repleated over the course of the admission with sodium being 135 on day of discharge (8/15).  Hyponatremia was thought to be multifactoral from the inpatient team's perspective with the patient being having poor PO intake and being on lisinopril and hctz. She advised to stop these medications on discharge until she could see her pcp after discharge. BMP was drawn during clinic visit which showed increase up to 138. Patient denies any of the nausea, constipation, or dizziness that brought her to the hospital. She had one bowel movement since discharge and has been urinating appropriately. She is afraid to go without both of her hypertension medications as she feels like her pressure will eventually increase while on her trip.  Patient with long history of progressive bloating. Found to have fecalization of small bowel contents at admission ct scan, suggestive of slow colonic transit time. Unclear if this slow transit is influenced by her hyponatremia in some way. Given that patient no longer endorses this bloating and has had bowel movements since discharge, it is possible that this has resolved. She denies any nausea or vomiting and has had no trouble with PO intake. Patient will need to keep track of this bloating and bowel habits during her trip.  Patient was found to have an incidental  finding of an ovarian cyst on ct scan during her admission. Was found to be 3.0 cm low-density structure adjacent to he left ovary and urinary bladder. This is a known finding to the patient who says that it has been followed for several years with serial ultrasounds. She said that this mass has actually decreased in size significantly after menopause.  Patient was found to have a UA with >100k CFU. Patient denies any symptoms of burning, painful, or frequent urination consistent with a UTI. She was prescribed a course of antibiotics after discharge from the hospital, but the patient has yet to fill this prescription. Patient further denies any problems with painful or burning urination. The patient does endorse some frequent urination which started several months ago. She says that she feels fine and then all of the sudden she gets a strong urge to go urinate and that she has to run to the restroom for relief.   CC: hospital follow-up   ROS: Review of Systems  Constitutional: Negative for chills and fever.  HENT: Negative for congestion and sinus pain.   Eyes: Negative for pain, discharge and redness.  Respiratory: Negative for cough, hemoptysis and sputum production.   Cardiovascular: Negative for chest pain and palpitations.  Gastrointestinal: Positive for abdominal pain, constipation, nausea and vomiting.  Genitourinary: Positive for frequency and urgency.  Musculoskeletal: Negative for myalgias and neck pain.  Skin: Negative for itching and rash.  Neurological: Negative for dizziness and headaches.  Psychiatric/Behavioral: Negative for depression and suicidal ideas.    Review of Systems See HPI for ROS.  CC, SH/smoking status, and VS noted  Objective: BP 138/82   Pulse 67   Temp 97.7 F (36.5 C) (Oral)   Wt 68.9 kg (152 lb)   BMI 31.77 kg/m  Gen: NAD, alert, cooperative, and pleasant. HEENT: NCAT, EOMI, PERRL CV: RRR, no murmur Resp: CTAB, no wheezes, non-labored Abd:  SNTND, BS present, no guarding or organomegaly Ext: No edema, warm Neuro: Alert and oriented, Speech clear, No gross deficits   Assessment and plan:  Hyponatremia Patient with marked improvement in sodium since presentation to the hospital. Patient with pressure of 138/82 at clinic visit today. Will plan on restarting lisinopril 20mg  daily and holding hctz. Will plan on having patient follow up in clinic after her trip for re-evaluation of blood pressure medications. - Hold HCTZ until after trip to [place] - Continue lisinopril 20mg  daily - follow up in clinic in 2 months after trip  Cyst of left ovary This is a problem known to the patient and has been managed in the past by her ob/gyn. She states that it has greatly decreased in size since menopause which is consistent with a uterine cyst. Will continue to monitor for s/s of possible obstruction caused by this cyst, but no treatment indicated for now.  Urge incontinence of urine Patient with s/s consistent with development of urge incontinence. Factors that indicate this include long time course, increasing age, and presentation. Patient with asymptomatic bacturia on UA from hospital. Given that she has not filled her prescription yet, I will recommend that patient not treat this. Patient informed that if she develops symptoms of burning or painful urination to please follow up in clinic. She can better discuss this chronic problem and attempt conservative treatment after she returns from trip.  Abdominal bloating Patient with long course of abdominal bloating. Has resolved at this clinic visit, but is a problem mentioned at several clinic visits in the past. Given that this is not problematic at this time, and that patient is about to leave for a prolonged trip, there is no treatment indicated at this time. I have asked that she keep track of her bowel movements and her bloating while she is away to better track these symptoms.  Given her CT  scan findings it is possible this is all caused by constipation. Have asked her to follow up when she returns from [place] for more discussion and evaluation.   Orders Placed This Encounter  Procedures  . Basic Metabolic Panel    Meds ordered this encounter  Medications  . lisinopril (PRINIVIL,ZESTRIL) 20 MG tablet    Sig: Take 1 tablet (20 mg total) by mouth daily.    Dispense:  90 tablet    Refill:  3     Guadalupe Dawn MD PGY-1 Family Medicine Residency 02/04/2017 5:45 PM

## 2017-02-04 DIAGNOSIS — N83202 Unspecified ovarian cyst, left side: Secondary | ICD-10-CM | POA: Insufficient documentation

## 2017-02-04 DIAGNOSIS — N3941 Urge incontinence: Secondary | ICD-10-CM | POA: Insufficient documentation

## 2017-02-04 DIAGNOSIS — R140 Abdominal distension (gaseous): Secondary | ICD-10-CM | POA: Insufficient documentation

## 2017-02-04 NOTE — Assessment & Plan Note (Signed)
This is a problem known to the patient and has been managed in the past by her ob/gyn. She states that it has greatly decreased in size since menopause which is consistent with a uterine cyst. Will continue to monitor for s/s of possible obstruction caused by this cyst, but no treatment indicated for now.

## 2017-02-04 NOTE — Assessment & Plan Note (Signed)
Patient with s/s consistent with development of urge incontinence. Factors that indicate this include long time course, increasing age, and presentation. Patient with asymptomatic bacturia on UA from hospital. Given that she has not filled her prescription yet, I will recommend that patient not treat this. Patient informed that if she develops symptoms of burning or painful urination to please follow up in clinic. She can better discuss this chronic problem and attempt conservative treatment after she returns from trip.

## 2017-02-04 NOTE — Assessment & Plan Note (Signed)
Patient with long course of abdominal bloating. Has resolved at this clinic visit, but is a problem mentioned at several clinic visits in the past. Given that this is not problematic at this time, and that patient is about to leave for a prolonged trip, there is no treatment indicated at this time. I have asked that she keep track of her bowel movements and her bloating while she is away to better track these symptoms.  Given her CT scan findings it is possible this is all caused by constipation. Have asked her to follow up when she returns from [place] for more discussion and evaluation.

## 2017-02-04 NOTE — Assessment & Plan Note (Signed)
Patient with marked improvement in sodium since presentation to the hospital. Patient with pressure of 138/82 at clinic visit today. Will plan on restarting lisinopril 20mg  daily and holding hctz. Will plan on having patient follow up in clinic after her trip for re-evaluation of blood pressure medications. - Hold HCTZ until after trip to [place] - Continue lisinopril 20mg  daily - follow up in clinic in 2 months after trip

## 2017-03-23 ENCOUNTER — Other Ambulatory Visit: Payer: Self-pay | Admitting: Family Medicine

## 2017-04-08 ENCOUNTER — Other Ambulatory Visit: Payer: Self-pay | Admitting: Family Medicine

## 2017-04-29 ENCOUNTER — Ambulatory Visit: Payer: Medicare Other | Admitting: Family Medicine

## 2017-05-01 ENCOUNTER — Encounter: Payer: Self-pay | Admitting: Family Medicine

## 2017-05-01 ENCOUNTER — Ambulatory Visit (INDEPENDENT_AMBULATORY_CARE_PROVIDER_SITE_OTHER): Payer: Medicare Other | Admitting: Family Medicine

## 2017-05-01 VITALS — BP 130/70 | HR 71 | Temp 98.1°F | Wt 155.0 lb

## 2017-05-01 DIAGNOSIS — R7303 Prediabetes: Secondary | ICD-10-CM | POA: Diagnosis not present

## 2017-05-01 DIAGNOSIS — Z1329 Encounter for screening for other suspected endocrine disorder: Secondary | ICD-10-CM

## 2017-05-01 DIAGNOSIS — K3184 Gastroparesis: Secondary | ICD-10-CM

## 2017-05-01 DIAGNOSIS — K30 Functional dyspepsia: Secondary | ICD-10-CM

## 2017-05-01 DIAGNOSIS — R42 Dizziness and giddiness: Secondary | ICD-10-CM

## 2017-05-01 DIAGNOSIS — I10 Essential (primary) hypertension: Secondary | ICD-10-CM

## 2017-05-01 DIAGNOSIS — E669 Obesity, unspecified: Secondary | ICD-10-CM | POA: Diagnosis not present

## 2017-05-01 DIAGNOSIS — R946 Abnormal results of thyroid function studies: Secondary | ICD-10-CM | POA: Diagnosis not present

## 2017-05-01 LAB — POCT GLYCOSYLATED HEMOGLOBIN (HGB A1C): Hemoglobin A1C: 6.2

## 2017-05-01 MED ORDER — AMLODIPINE BESYLATE 5 MG PO TABS: 5.0000 mg | ORAL_TABLET | Freq: Every day | ORAL | 1 refills | Status: DC

## 2017-05-01 NOTE — Progress Notes (Signed)
   Subjective:   Patient ID: Susan Keith    DOB: 07/31/1945, 71 y.o. female   MRN: [id number]  CC: HTN, prediabetes follow-up   HPI: Susan Keith is a 71 y.o. female who presents to clinic today for hypertension and prediabetes follow-up.  She is primarily [place].  She presents with her husband and a certified interpreter to translate for her.  Prediabetes Patient has been eating and drinking well.  She states she has good healthy habits.  Cooks her own food at home and rarely has been eating out.  Does not eat fried foods.  She limits snacks and sugary treats.  She states she sometimes feels her blood sugars elevated.  She denies any hypoglycemic episodes.  Denies abdominal pain, nausea, vomiting.  She denies urinary symptoms.  HTN Additionally, patient is concerned about her blood pressures.  She states she was hospitalized prior to her trip to [place] and at that time several of her medications were discontinued due to concerns while admitted.  She has only been taking lisinopril 20 mg daily in the morning.  She states that she sometimes feels her blood pressure go up and down.  She has had some dizziness which is not a new symptom.  Denies blurry vision and headaches.  Has not had any syncopal episodes.    ROS: Denies fever, chills, nausea, vomiting, blurry vision.  Denies abdominal pain, shortness breath, palpitations.  [place]: Pertinent past medical, surgical, family, and social history were reviewed and updated as appropriate. Smoking status reviewed. Medications reviewed. Objective:   BP 130/70   Pulse 71   Temp 98.1 F (36.7 C) (Oral)   Wt 155 lb (70.3 kg)   SpO2 96%   BMI 32.40 kg/m  Vitals and nursing note reviewed.  General: Pleasant 71 year old female, NAD, interpreter is present HEENT: NCAT, PERRLA, EOMI, MMM Neck: supple, no JVD CV: RRR no MRG Lungs: CTAP, nonlabored Abdomen: Soft, nontender, positive bowel sounds Skin: warm, dry, no rash    Extremities: warm and well perfused  Assessment & Plan:   Essential hypertension Blood pressure is well controlled at this visit-130/70.  Patient is currently asymptomatic.  She denies dizziness, headache or visual changes.  Physical exam is without red flags. -Continue lisinopril 20 mg daily -Add Norvasc 5 mg daily -Advised patient to check her blood pressures at home and record them when she is feeling symptoms. -Return precautions have been discussed  Prediabetes Check A1c today   Thyroid disorder screen Patient with TSH of 6.1 while hospitalized.  Recommended  repeat TSH as outpatient. -TSH pending  Orders Placed This Encounter  Procedures  . Lipid panel  . TSH  . Ambulatory referral to Gastroenterology    Referral Priority:   Routine    Referral Type:   Consultation    Referral Reason:   Specialty Services Required    Number of Visits Requested:   1  . POCT glycosylated hemoglobin (Hb A1C)   Meds ordered this encounter  Medications  . amLODipine (NORVASC) 5 MG tablet    Sig: Take 1 tablet (5 mg total) by mouth daily.    Dispense:  30 tablet    Refill:  1   Follow up: PRN   Lovenia Kim, MD [place], PGY-2 05/02/2017 3:16 PM

## 2017-05-01 NOTE — Patient Instructions (Signed)
You were seen in clinic for follow up after your hospital visit.  I have ordered some bloodwork to check your thyroid and sugar level in addition to your cholesterol.   I will call you with the results of this once I have them.  Also, I have referred you to a gastroenterologist for your slow gastric emptying which was a concern while you were in the hospital.  You can expect a call within the next week or so to be scheduled for this appointment.  Please call clinic if you have any questions.   Be well, Lovenia Kim, MD

## 2017-05-02 DIAGNOSIS — Z1329 Encounter for screening for other suspected endocrine disorder: Secondary | ICD-10-CM | POA: Insufficient documentation

## 2017-05-02 DIAGNOSIS — R7303 Prediabetes: Secondary | ICD-10-CM | POA: Insufficient documentation

## 2017-05-02 LAB — LIPID PANEL
CHOL/HDL RATIO: 3.3 ratio (ref 0.0–4.4)
Cholesterol, Total: 183 mg/dL (ref 100–199)
HDL: 55 mg/dL (ref >39)
LDL CALC: 53 mg/dL (ref 0–99)
Triglycerides: 377 mg/dL — ABNORMAL HIGH (ref 0–149)
VLDL Cholesterol Cal: 75 mg/dL — ABNORMAL HIGH (ref 5–40)

## 2017-05-02 LAB — TSH: TSH: 6.48 u[IU]/mL — ABNORMAL HIGH (ref 0.450–4.500)

## 2017-05-02 NOTE — Assessment & Plan Note (Signed)
Blood pressure is well controlled at this visit-130/70.  Patient is currently asymptomatic.  She denies dizziness, headache or visual changes.  Physical exam is without red flags. -Continue lisinopril 20 mg daily -Add Norvasc 5 mg daily -Advised patient to check her blood pressures at home and record them when she is feeling symptoms. -Return precautions have been discussed

## 2017-05-02 NOTE — Assessment & Plan Note (Signed)
Patient with TSH of 6.1 while hospitalized.  Recommended  repeat TSH as outpatient. -TSH pending

## 2017-05-02 NOTE — Assessment & Plan Note (Signed)
Check A1c today.

## 2017-05-27 HISTORY — PX: UPPER GASTROINTESTINAL ENDOSCOPY: SHX188

## 2017-06-04 ENCOUNTER — Other Ambulatory Visit: Payer: Self-pay | Admitting: Family Medicine

## 2017-06-11 ENCOUNTER — Encounter: Payer: Self-pay | Admitting: Internal Medicine

## 2017-06-27 ENCOUNTER — Other Ambulatory Visit: Payer: Self-pay

## 2017-06-30 ENCOUNTER — Other Ambulatory Visit: Payer: Self-pay

## 2017-06-30 MED ORDER — AMLODIPINE BESYLATE 5 MG PO TABS: 5.0000 mg | ORAL_TABLET | Freq: Every day | ORAL | 1 refills | Status: DC

## 2017-07-01 ENCOUNTER — Other Ambulatory Visit: Payer: Self-pay | Admitting: *Deleted

## 2017-07-01 MED ORDER — ATORVASTATIN CALCIUM 20 MG PO TABS: 20.0000 mg | ORAL_TABLET | Freq: Every day | ORAL | 0 refills | Status: DC

## 2017-07-05 ENCOUNTER — Other Ambulatory Visit: Payer: Self-pay | Admitting: Family Medicine

## 2017-07-21 ENCOUNTER — Ambulatory Visit (INDEPENDENT_AMBULATORY_CARE_PROVIDER_SITE_OTHER): Payer: Medicare Other | Admitting: Internal Medicine

## 2017-07-21 ENCOUNTER — Encounter (INDEPENDENT_AMBULATORY_CARE_PROVIDER_SITE_OTHER): Payer: Self-pay

## 2017-07-21 ENCOUNTER — Encounter: Payer: Self-pay | Admitting: Internal Medicine

## 2017-07-21 VITALS — BP 154/86 | Ht 61.0 in | Wt 158.0 lb

## 2017-07-21 DIAGNOSIS — K219 Gastro-esophageal reflux disease without esophagitis: Secondary | ICD-10-CM | POA: Diagnosis not present

## 2017-07-21 DIAGNOSIS — Z86010 Personal history of colonic polyps: Secondary | ICD-10-CM | POA: Diagnosis not present

## 2017-07-21 DIAGNOSIS — R110 Nausea: Secondary | ICD-10-CM | POA: Diagnosis not present

## 2017-07-21 DIAGNOSIS — R140 Abdominal distension (gaseous): Secondary | ICD-10-CM

## 2017-07-21 NOTE — Patient Instructions (Signed)
Make sure to take your Omeprazole daily.  Take Zofran as needed.  You will be due for another colonoscopy in June, 2020.  You will receive a letter in the mail to remind you to schedule it.

## 2017-07-21 NOTE — Progress Notes (Signed)
HISTORY OF PRESENT ILLNESS:  Susan Keith is a 72 y.o. female , native of the [place], with history of hypertension, hyperlipidemia, and osteoarthritis. She is sent today by her primary care provider Dr. Kris Mouton with [facility]. The patient is accompanied by a professional interpreter. Chief complaints are reflux, nausea, and bloating. Patient states that her symptoms have been present for a little over than 1 year. She was hospitalized in early August with nausea, constipation, decreased oral intake, and dizziness. She was found to be hyponatremic with sodium of 114. This was felt to be secondary to diuretics which have since been stopped. Follow-up blood work in August revealed normal sodium. She has not been seen in the clinic for 6 months. She did take a trip to the [place] in the interim. Patient tells me that she has classic heartburn symptoms. She is prescribed omeprazole but only takes this sporadically or every other day. She also has Zofran which was helpful in the past. Currently not using. No dysphagia. I do have a letter dated 11/09/2015 from Dr. Cherre Robins , [facility] gastroenterologist, who describes recent colonoscopy with 5 adenomatous polyps in the right colon for which follow-up in 3 years recommended. Also describes medium-sized hiatal hernia being identified on endoscopy performed for reflux symptoms. Patient has had no weight loss. She reports daily bowel movements of medium consistency. Denies constipation. No bleeding. She does have bloating. She feels much better when she belches. She uses baking soda.  REVIEW OF SYSTEMS:  All non-GI ROS negative unless otherwise stated in the history of present illness   Past Medical History:  Diagnosis Date  . Arthritis    "right hip" (01/07/2017)  . Fibroids    "they've decreased in size" (01/07/2017)  . GERD (gastroesophageal reflux disease)   . High cholesterol   . Hypertension   . Pre-diabetes     Past  Surgical History:  Procedure Laterality Date  . APPENDECTOMY    . FRACTURE SURGERY    . WRIST FRACTURE SURGERY Right 05/2015    Social History Adysen Raphael  reports that she has been smoking cigarettes.  She has a 12.00 pack-year smoking history. she has never used smokeless tobacco. She reports that she does not drink alcohol or use drugs.  family history is not on file.  No Known Allergies     PHYSICAL EXAMINATION: Vital signs: BP (!) 154/86   Ht 5\' 1"  (1.549 m)   Wt 158 lb (71.7 kg)   BMI 29.85 kg/m   Constitutional: generally well-appearing, no acute distress Psychiatric: alert and oriented x3, cooperative Eyes: extraocular movements intact, anicteric, conjunctiva pink Mouth: oral pharynx moist, no lesions Neck: supple no lymphadenopathy Cardiovascular: heart regular rate and rhythm, no murmur Lungs: clear to auscultation bilaterally Abdomen: soft, obese, nontender, nondistended, no obvious ascites, no peritoneal signs, normal bowel sounds, no organomegaly Rectal: Omitted Extremities: no clubbing, cyanosis, or lower extremity edema bilaterally Skin: no lesions on visible extremities Neuro: No focal deficits. Cranial nerves intact  ASSESSMENT:  #1. Chronic GERD. It had a clean treated with sporadic PPI use #2. Nausea without vomiting. Suspect on the basis of GERD #3. History of adenomatous colon polyps. Based on available information due for follow-up around June 2020 #4. Abdominal bloating without alarm features   PLAN:  #1. Reflux precautions with attention to weight loss #2. Take omeprazole 40 mg DAILY. Take in a.m. 30 minutes before first meal #3. Zofran as needed for significant nausea #4. Okay to use carbonated beverages to  assist with belching if this makes her feel better. Discouraged from using baking soda regularly #5. Return to the care of your PCP regarding multiple non-GI questions and concerns #6. Surveillance colonoscopy around June 2020. I have  asked the CMA to place a recall notification in the computer. Interval GI follow-up as needed  A copy of this consultation note has been sent to [facility]

## 2017-07-29 ENCOUNTER — Other Ambulatory Visit: Payer: Self-pay

## 2017-07-29 MED ORDER — ATORVASTATIN CALCIUM 20 MG PO TABS: 20.0000 mg | ORAL_TABLET | Freq: Every day | ORAL | 0 refills | Status: DC

## 2017-08-28 ENCOUNTER — Other Ambulatory Visit: Payer: Self-pay

## 2017-08-29 MED ORDER — ATORVASTATIN CALCIUM 20 MG PO TABS: 20.0000 mg | ORAL_TABLET | Freq: Every day | ORAL | 5 refills | Status: DC

## 2017-08-29 MED ORDER — AMLODIPINE BESYLATE 5 MG PO TABS: 5.0000 mg | ORAL_TABLET | Freq: Every day | ORAL | 5 refills | Status: DC

## 2017-09-27 ENCOUNTER — Other Ambulatory Visit: Payer: Self-pay | Admitting: Family Medicine

## 2017-11-04 ENCOUNTER — Encounter (INDEPENDENT_AMBULATORY_CARE_PROVIDER_SITE_OTHER): Payer: Self-pay

## 2017-11-04 ENCOUNTER — Ambulatory Visit (INDEPENDENT_AMBULATORY_CARE_PROVIDER_SITE_OTHER): Payer: Medicare Other | Admitting: Internal Medicine

## 2017-11-04 ENCOUNTER — Encounter: Payer: Self-pay | Admitting: Internal Medicine

## 2017-11-04 ENCOUNTER — Other Ambulatory Visit (INDEPENDENT_AMBULATORY_CARE_PROVIDER_SITE_OTHER): Payer: Medicare Other

## 2017-11-04 VITALS — BP 122/78 | HR 72 | Ht 61.0 in | Wt 159.5 lb

## 2017-11-04 DIAGNOSIS — R1013 Epigastric pain: Secondary | ICD-10-CM | POA: Diagnosis not present

## 2017-11-04 DIAGNOSIS — R140 Abdominal distension (gaseous): Secondary | ICD-10-CM

## 2017-11-04 DIAGNOSIS — K219 Gastro-esophageal reflux disease without esophagitis: Secondary | ICD-10-CM

## 2017-11-04 LAB — H. PYLORI ANTIBODY, IGG: H Pylori IgG: POSITIVE — AB

## 2017-11-04 NOTE — Patient Instructions (Signed)
Your provider has requested that you go to the basement level for lab work before leaving today. Press "B" on the elevator. The lab is located at the first door on the left as you exit the elevator.  You have been scheduled for an endoscopy. Please follow written instructions given to you at your visit today. If you use inhalers (even only as needed), please bring them with you on the day of your procedure. Your physician has requested that you go to www.startemmi.com and enter the access code given to you at your visit today. This web site gives a general overview about your procedure. However, you should still follow specific instructions given to you by our office regarding your preparation for the procedure.  

## 2017-11-04 NOTE — Progress Notes (Signed)
HISTORY OF PRESENT ILLNESS:  Susan Keith is a 72 y.o. female  , native of the [place] who is accompanied by a Psychologist, sport and exercise, with a history of hyptension, hyperlipidemia, and osteoarthritis who was initially evaluated 07/21/2017 for reflux, nausea, and bloating which had been present for greater than one year. See that dictation for details. Previous colonoscopy elsewhere June 2017 with multiple adenomas. Upper endoscopy with medium-sized hiatal hernia. At the time of her last visit the patient was advised with regards to reflux precautions. She has not lost weight. She was placed on omeprazole 40 mg daily. This has helped indigestion. Carbonated beverages to assist with belching. Recall colonoscopy around June 2020. She presents today with ongoing dyspeptic complaints with concern and worry. She describes abdominal fullness or bloating. Occasional regurgitation. Belching. She is worried about hiatal hernia. She is worried about Helicobacter pylori. She is having daily bowel movements. No weight loss. She has not seen her PCP since her last GI visit. She tells me that she is compliant with daily PPI.  REVIEW OF SYSTEMS:  All non-GI ROS negative except for  Arthritis, cough, irregular heartbeat, hearing problems, excessive urination  Past Medical History:  Diagnosis Date  . Arthritis    "right hip" (01/07/2017)  . Fibroids    "they've decreased in size" (01/07/2017)  . GERD (gastroesophageal reflux disease)   . High cholesterol   . Hypertension   . Pre-diabetes     Past Surgical History:  Procedure Laterality Date  . APPENDECTOMY    . FRACTURE SURGERY    . WRIST FRACTURE SURGERY Right 05/2015    Social History [name]  reports that she has been smoking cigarettes.  She has a 12.00 pack-year smoking history. She has never used smokeless tobacco. She reports that she does not drink alcohol or use drugs.  family history is not on file.  No Known Allergies      PHYSICAL EXAMINATION: Vital signs: BP 122/78   Pulse 72   Ht 5\' 1"  (1.549 m)   Wt 159 lb 8 oz (72.3 kg)   BMI 30.14 kg/m   Constitutional: generally well-appearing, no acute distress Psychiatric: alert and oriented x3, cooperative Eyes: extraocular movements intact, anicteric, conjunctiva pink Mouth: oral pharynx moist, no lesions Neck: supple no lymphadenopathy Cardiovascular: heart regular rate and rhythm, no murmur Lungs: clear to auscultation bilaterally Abdomen: soft,obese, nontender, nondistended, no obvious ascites, no peritoneal signs, normal bowel sounds, no organomegaly Rectal:omitted Extremities: no clubbing, cyanosis, or lower extremity edema bilaterally Skin: no lesions on visible extremities Neuro: No focal deficits. Cranial nerves intact  ASSESSMENT:  #1. Chronic dyspepsia with epigastric fullness discomfort. No alarm features. Health related anxiety #2. GERD #3. Bloating. CT scan to evaluate the same was performed 01/07/2017. Multiple incidental findings. No acute findings. #4. Obesity #5. History of adenomatous colon polyps  PLAN:  #1. Reflux precautions with attention to weight loss #2. Continue daily PPI #3. Schedule upper endoscopy to further evaluate symptoms.The nature of the procedure, as well as the risks, benefits, and alternatives were carefully and thoroughly reviewed with the patient. Ample time for discussion and questions allowed. The patient understood, was satisfied, and agreed to proceed. #4. Check Helicobacter pylori antibody. If positive, obtain endoscopic biopsies to assess for active infection. #5. Surveillance colonoscopy 2020 as previously recommended #6. Encouraged to see primary care provider on a regular basis for non-GI complaints and management of chronic general medical problems #7. Follow-up thereafter to be determined  40 minutes spent face-to-face with the patient.  Greater than 50% a time use for counseling regarding her  chronic dyspepsia, GERD, obesity, bloating,their assessment and follow-up

## 2017-12-16 ENCOUNTER — Encounter: Payer: Self-pay | Admitting: Internal Medicine

## 2017-12-16 ENCOUNTER — Ambulatory Visit (AMBULATORY_SURGERY_CENTER): Payer: Medicare Other | Admitting: Internal Medicine

## 2017-12-16 ENCOUNTER — Other Ambulatory Visit: Payer: Self-pay

## 2017-12-16 VITALS — BP 118/68 | HR 69 | Temp 98.4°F | Resp 13 | Ht 61.0 in | Wt 159.0 lb

## 2017-12-16 DIAGNOSIS — K219 Gastro-esophageal reflux disease without esophagitis: Secondary | ICD-10-CM | POA: Diagnosis not present

## 2017-12-16 DIAGNOSIS — K449 Diaphragmatic hernia without obstruction or gangrene: Secondary | ICD-10-CM

## 2017-12-16 DIAGNOSIS — I10 Essential (primary) hypertension: Secondary | ICD-10-CM | POA: Diagnosis not present

## 2017-12-16 DIAGNOSIS — R1013 Epigastric pain: Secondary | ICD-10-CM | POA: Diagnosis not present

## 2017-12-16 DIAGNOSIS — R1310 Dysphagia, unspecified: Secondary | ICD-10-CM | POA: Diagnosis not present

## 2017-12-16 DIAGNOSIS — R768 Other specified abnormal immunological findings in serum: Secondary | ICD-10-CM | POA: Diagnosis not present

## 2017-12-16 MED ORDER — SODIUM CHLORIDE 0.9 % IV SOLN: 500.0000 mL | Freq: Once | INTRAVENOUS | Status: DC

## 2017-12-16 NOTE — Progress Notes (Signed)
Called to room to assist during endoscopic procedure.  Patient ID and intended procedure confirmed with present staff. Received instructions for my participation in the procedure from the performing physician.  

## 2017-12-16 NOTE — Progress Notes (Signed)
Swelling and redness noted to upper right arm, along with small bruise.  Redness also noted to left upper arm, patient states they are mosquito bites.

## 2017-12-16 NOTE — Op Note (Signed)
[place] Patient Name: Susan Keith Procedure Date: 12/16/2017 11:05 AM MRN: [id number] Endoscopist: Docia Chuck. Henrene Pastor , MD Age: 72 Referring MD:  Date of Birth: 1946-01-12 Gender: Female Account #: [id number] Procedure:                Upper GI endoscopy, with biopsies Indications:              Dyspepsia. Positive Helicobacter pylori antibody Medicines:                Monitored Anesthesia Care Procedure:                Pre-Anesthesia Assessment:                           - Prior to the procedure, a History and Physical                            was performed, and patient medications and                            allergies were reviewed. The patient's tolerance of                            previous anesthesia was also reviewed. The risks                            and benefits of the procedure and the sedation                            options and risks were discussed with the patient.                            All questions were answered, and informed consent                            was obtained. Prior Anticoagulants: The patient has                            taken no previous anticoagulant or antiplatelet                            agents. ASA Grade Assessment: II - A patient with                            mild systemic disease. After reviewing the risks                            and benefits, the patient was deemed in                            satisfactory condition to undergo the procedure.                           After obtaining informed consent, the endoscope was  passed under direct vision. Throughout the                            procedure, the patient's blood pressure, pulse, and                            oxygen saturations were monitored continuously. The                            Endoscope was introduced through the mouth, and                            advanced to the second part of duodenum. The upper            GI endoscopy was accomplished without difficulty.                            The patient tolerated the procedure well. Scope In: Scope Out: Findings:                 The esophagus revealed mild erythema at the                            gastroesophageal junction. This was located at 32                            cm from the incisors.                           The stomach Revealed a hiatal hernia, question                            paraesophageal component. As well as antral                            erythema.. Biopsies were taken with a cold forceps                            for Helicobacter pylori testing using CLOtest.                           The examined duodenum was normal.                           The cardia and gastric fundus were normal on                            retroflexion, save hiatal hernia. Complications:            No immediate complications. Estimated Blood Loss:     Estimated blood loss: none. Impression:               1. Mild esophagitis                           2. Hiatal hernia, question paraesophageal component  3. Mild gastritis status post CLO biopsy. Recommendation:           1. Continue current medications                           2. Follow-up CLO test                           3. Schedule upper GI series "rule out                            paraesophageal hernia"                           4. Routine office follow-up with Dr. Henrene Pastor after                            the above completed Docia Chuck. Henrene Pastor, MD 12/16/2017 11:22:09 AM This report has been signed electronically.

## 2017-12-16 NOTE — Progress Notes (Signed)
Report given to PACU, vss 

## 2017-12-16 NOTE — Patient Instructions (Signed)
YOU HAD AN ENDOSCOPIC PROCEDURE TODAY: Refer to the procedure report and other information in the discharge instructions given to you for any specific questions about what was found during the examination. If this information does not answer your questions, please call [facility] office at [phone] to clarify.   YOU SHOULD EXPECT: Some feelings of bloating in the abdomen. Passage of more gas than usual. Walking can help get rid of the air that was put into your GI tract during the procedure and reduce the bloating. If you had a lower endoscopy (such as a colonoscopy or flexible sigmoidoscopy) you may notice spotting of blood in your stool or on the toilet paper. Some abdominal soreness may be present for a day or two, also.  DIET: Your first meal following the procedure should be a light meal and then it is ok to progress to your normal diet. A half-sandwich or bowl of soup is an example of a good first meal. Heavy or fried foods are harder to digest and may make you feel nauseous or bloated. Drink plenty of fluids but you should avoid alcoholic beverages for 24 hours. If you had a esophageal dilation, please see attached instructions for diet.    ACTIVITY: Your care partner should take you home directly after the procedure. You should plan to take it easy, moving slowly for the rest of the day. You can resume normal activity the day after the procedure however YOU SHOULD NOT DRIVE, use power tools, machinery or perform tasks that involve climbing or major physical exertion for 24 hours (because of the sedation medicines used during the test).   SYMPTOMS TO REPORT IMMEDIATELY: A gastroenterologist can be reached at any hour. Please call [phone]  for any of the following symptoms:   Following upper endoscopy (EGD, EUS, ERCP, esophageal dilation) Vomiting of blood or coffee ground material  New, significant abdominal pain  New, significant chest pain or pain under the shoulder blades  Painful or  persistently difficult swallowing  New shortness of breath  Black, tarry-looking or red, bloody stools  FOLLOW UP:  If any biopsies were taken you will be contacted by phone or by letter within the next 1-3 weeks. Call [phone]  if you have not heard about the biopsies in 3 weeks.  Please also call with any specific questions about appointments or follow up tests.  

## 2017-12-17 ENCOUNTER — Other Ambulatory Visit: Payer: Self-pay

## 2017-12-17 ENCOUNTER — Telehealth: Payer: Self-pay

## 2017-12-17 ENCOUNTER — Other Ambulatory Visit: Payer: Self-pay | Admitting: Family Medicine

## 2017-12-17 ENCOUNTER — Telehealth: Payer: Self-pay | Admitting: *Deleted

## 2017-12-17 DIAGNOSIS — K449 Diaphragmatic hernia without obstruction or gangrene: Secondary | ICD-10-CM

## 2017-12-17 LAB — HELICOBACTER PYLORI SCREEN-BIOPSY: UREASE: NEGATIVE

## 2017-12-17 NOTE — Telephone Encounter (Signed)
Pt scheduled for Upper GI series at [facility] 12/30/17@9 :30am, pt to arrive there at 9:15am. Pt to be NPO after midnight. Pts daughter aware of appt and prep.

## 2017-12-17 NOTE — Telephone Encounter (Signed)
  Follow up Call-  Call back number 12/16/2017  Post procedure Call Back phone  # [phone] daughter- Elray Mcgregor  Permission to leave phone message Yes     Patient questions:  Do you have a fever, pain , or abdominal swelling? No. Pain Score  0 *  Have you tolerated food without any problems? Yes.    Have you been able to return to your normal activities? Yes.    Do you have any questions about your discharge instructions: Diet   No. Medications  No. Follow up visit  No.  Do you have questions or concerns about your Care? No.  Actions: * If pain score is 4 or above: No action needed, pain <4.

## 2017-12-30 ENCOUNTER — Other Ambulatory Visit: Payer: Self-pay | Admitting: Family Medicine

## 2017-12-30 ENCOUNTER — Ambulatory Visit (HOSPITAL_COMMUNITY)
Admission: RE | Admit: 2017-12-30 | Discharge: 2017-12-30 | Disposition: A | Discharge status: Home / Self Care | Payer: Medicare Other | Source: Ambulatory Visit | Attending: Internal Medicine | Admitting: Internal Medicine

## 2017-12-30 DIAGNOSIS — K219 Gastro-esophageal reflux disease without esophagitis: Secondary | ICD-10-CM | POA: Insufficient documentation

## 2017-12-30 DIAGNOSIS — K449 Diaphragmatic hernia without obstruction or gangrene: Secondary | ICD-10-CM

## 2018-01-16 ENCOUNTER — Other Ambulatory Visit: Payer: Self-pay | Admitting: Family Medicine

## 2018-02-04 ENCOUNTER — Ambulatory Visit: Payer: Medicare Other | Admitting: Internal Medicine

## 2018-02-10 ENCOUNTER — Telehealth: Payer: Self-pay

## 2018-02-10 NOTE — Telephone Encounter (Signed)
Pt LVM on nurse line asking for a refill on one of her medications. It was hard to understand on the VM. I called to get clarification, she did not answer. I asked other to call the clinic back.

## 2018-02-11 NOTE — Telephone Encounter (Signed)
Pt daughter said she needed her bp medicine and they already refilled it at the pharmacy. Deseree Kennon Holter, CMA

## 2018-02-13 ENCOUNTER — Other Ambulatory Visit: Payer: Self-pay | Admitting: Family Medicine

## 2018-02-17 ENCOUNTER — Other Ambulatory Visit: Payer: Self-pay | Admitting: *Deleted

## 2018-02-17 MED ORDER — ATORVASTATIN CALCIUM 20 MG PO TABS: 20.0000 mg | ORAL_TABLET | Freq: Every day | ORAL | 3 refills | Status: DC

## 2018-03-16 ENCOUNTER — Other Ambulatory Visit: Payer: Self-pay | Admitting: Family Medicine

## 2018-03-18 ENCOUNTER — Other Ambulatory Visit: Payer: Self-pay

## 2018-03-18 ENCOUNTER — Ambulatory Visit (INDEPENDENT_AMBULATORY_CARE_PROVIDER_SITE_OTHER): Payer: Medicare Other | Admitting: Internal Medicine

## 2018-03-18 ENCOUNTER — Other Ambulatory Visit: Payer: Self-pay | Admitting: Family Medicine

## 2018-03-18 ENCOUNTER — Encounter: Payer: Self-pay | Admitting: Internal Medicine

## 2018-03-18 ENCOUNTER — Ambulatory Visit (INDEPENDENT_AMBULATORY_CARE_PROVIDER_SITE_OTHER): Payer: Medicare Other | Admitting: Family Medicine

## 2018-03-18 ENCOUNTER — Ambulatory Visit (HOSPITAL_COMMUNITY)
Admission: RE | Admit: 2018-03-18 | Discharge: 2018-03-18 | Disposition: A | Discharge status: Home / Self Care | Payer: Medicare Other | Source: Ambulatory Visit | Attending: Family Medicine | Admitting: Family Medicine

## 2018-03-18 VITALS — BP 122/72 | HR 70 | Temp 98.5°F | Ht 61.0 in | Wt 149.0 lb

## 2018-03-18 VITALS — BP 124/78 | HR 80 | Ht 58.5 in | Wt 149.1 lb

## 2018-03-18 DIAGNOSIS — M160 Bilateral primary osteoarthritis of hip: Secondary | ICD-10-CM | POA: Insufficient documentation

## 2018-03-18 DIAGNOSIS — R110 Nausea: Secondary | ICD-10-CM

## 2018-03-18 DIAGNOSIS — I10 Essential (primary) hypertension: Secondary | ICD-10-CM | POA: Diagnosis not present

## 2018-03-18 DIAGNOSIS — E785 Hyperlipidemia, unspecified: Secondary | ICD-10-CM | POA: Diagnosis not present

## 2018-03-18 DIAGNOSIS — Z1231 Encounter for screening mammogram for malignant neoplasm of breast: Secondary | ICD-10-CM | POA: Diagnosis not present

## 2018-03-18 DIAGNOSIS — M25552 Pain in left hip: Secondary | ICD-10-CM

## 2018-03-18 DIAGNOSIS — E663 Overweight: Secondary | ICD-10-CM

## 2018-03-18 DIAGNOSIS — K219 Gastro-esophageal reflux disease without esophagitis: Secondary | ICD-10-CM

## 2018-03-18 DIAGNOSIS — K449 Diaphragmatic hernia without obstruction or gangrene: Secondary | ICD-10-CM | POA: Diagnosis not present

## 2018-03-18 DIAGNOSIS — R140 Abdominal distension (gaseous): Secondary | ICD-10-CM

## 2018-03-18 DIAGNOSIS — Z0000 Encounter for general adult medical examination without abnormal findings: Secondary | ICD-10-CM

## 2018-03-18 DIAGNOSIS — Z23 Encounter for immunization: Secondary | ICD-10-CM

## 2018-03-18 DIAGNOSIS — M1612 Unilateral primary osteoarthritis, left hip: Secondary | ICD-10-CM | POA: Diagnosis not present

## 2018-03-18 DIAGNOSIS — R7303 Prediabetes: Secondary | ICD-10-CM | POA: Diagnosis not present

## 2018-03-18 LAB — POCT GLYCOSYLATED HEMOGLOBIN (HGB A1C): Hemoglobin A1C: 5.6 % (ref 4.0–5.6)

## 2018-03-18 MED ORDER — OMEPRAZOLE 40 MG PO CPDR: DELAYED_RELEASE_CAPSULE | ORAL | 11 refills | Status: DC

## 2018-03-18 MED ORDER — ONDANSETRON HCL 4 MG PO TABS: 4.0000 mg | ORAL_TABLET | Freq: Three times a day (TID) | ORAL | 11 refills | Status: DC | PRN

## 2018-03-18 NOTE — Progress Notes (Signed)
HISTORY OF PRESENT ILLNESS:   Susan Keith is a pleasant 72 y.o. female from the [place] who was evaluated in this office November 04, 2017 regarding chronic dyspepsia with epigastric fullness, GERD, bloating, truncal obesity, health-related anxiety, and a history of adenomatous colon polyps.  See that dictation for details.  She subsequently underwent upper endoscopy December 16, 2017 and was found to have mild esophagitis as well as a complex appearing hiatal hernia.  Also mild gastritis.  Testing for Helicobacter pylori was negative.  She was continued on omeprazole 40 mg daily for her GERD and Zofran as needed for nausea.  She was also set up for an upper GI series to evaluate the hernia.  I discussed this examination with the radiologist.  She clearly has a moderate sized hiatal hernia with what appears to be a paraesophageal component.  When compared to CT scan from August 2018, no significant change.  Patient is pleased to report that overall she is feeling well.  No active reflux symptoms or pain.  No swallowing difficulties.  She continues on omeprazole.  She does mention issues with nausea once possibly twice per week lasting 30 minutes.  Nonspecific.  She will use Zofran if needed.  This is helpful.  No vomiting.  She is eating well.  No bowel habit issues.  Due for surveillance colonoscopy next year.  She has been successful in losing 10 pounds since her last visit.  This makes her feel better.  She is accompanied today by a Psychologist, sport and exercise and her husband.  Her husband has multiple questions for me regarding his GI issues which we addressed  REVIEW OF SYSTEMS:  All non-GI ROS negative unless otherwise stated in the HPI except for arthritis, fatigue, cough, excessive urination, excessive thirst, urinary leakage  Past Medical History:  Diagnosis Date  . Arthritis    "right hip" (01/07/2017)  . Fibroids    "they've decreased in size" (01/07/2017)  . GERD (gastroesophageal reflux disease)   .  High cholesterol   . Hypertension   . Pre-diabetes     Past Surgical History:  Procedure Laterality Date  . APPENDECTOMY    . FRACTURE SURGERY    . WRIST FRACTURE SURGERY Right 05/2015    Social History Dominigue Gellner  reports that she has been smoking cigarettes. She has a 12.00 pack-year smoking history. She has never used smokeless tobacco. She reports that she does not drink alcohol or use drugs.  family history is not on file.  Allergies  Allergen Reactions  . Latex     Added based on information entered during case entry, please review and add reactions, type, and severity as needed       PHYSICAL EXAMINATION: Vital signs: BP 124/78 (BP Location: Left Arm, Patient Position: Sitting, Cuff Size: Normal)   Pulse 80   Ht 4' 10.5" (1.486 m) Comment: height measured without shoes  Wt 149 lb 2 oz (67.6 kg)   BMI 30.64 kg/m   Constitutional: generally well-appearing, no acute distress Psychiatric: alert and oriented x3, cooperative Eyes: extraocular movements intact, anicteric, conjunctiva pink Mouth: oral pharynx moist, no lesions Neck: supple no lymphadenopathy Cardiovascular: heart regular rate and rhythm, no murmur Lungs: clear to auscultation bilaterally Abdomen: soft, nontender, nondistended, no obvious ascites, no peritoneal signs, normal bowel sounds, no organomegaly Rectal: Mid Extremities: no clubbing, cyanosis, or lower extremity edema bilaterally Skin: no lesions on visible extremities Neuro: No focal deficits.  Cranial nerves intact  ASSESSMENT:  1.  GERD.  Symptoms controlled  with omeprazole 40 mg daily 2.  Nausea.  Nonspecific.  Infrequent.  No vomiting.  Zofran helps if needed 3.  Moderate size hiatal hernia with stable paraesophageal component.  No pain.  We discussed the role of surgery.  Continue to observe 4.  History of adenomatous colon polyps due for follow-up 2020 5.  Obesity.   PLAN:  1.  Reflux precautions 2.  Ongoing weight loss 3.   Continue omeprazole 40 mg daily.  Prescription refilled 4.  Continue Zofran as needed.  Prescription refilled 5.  Surveillance colonoscopy 2020 6.  Routine GI follow-up one year.  Sooner if needed  25 minutes was spent face-to-face with the patient.  Greater than 50% the time used for counseling regarding her GERD, hiatal hernia, and issues with nausea

## 2018-03-18 NOTE — Patient Instructions (Addendum)
It was nice seeing you again today.  You were seen in clinic for routine physical, bloodwork and left hip pain.  You received a flu shot and your pneumonia vaccine.  We also discussed a screening mammogram for health maintenance.  Please call the number on the paper provided to schedule this.   For your left hip pain, it is likely due to osteoarthritis.  I am ordering a x-ray and would like you to go to the first floor radiology department in [facility] to have this done.  I will give you a call once the results return.  Please call clinic if you have any questions.  Lovenia Kim MD

## 2018-03-18 NOTE — Patient Instructions (Signed)
We have sent the following medications to your pharmacy for you to pick up at your convenience: Omeprazole zofran  Please follow up with Dr Henrene Pastor in 1 year.  If you are age 70 or older, your body mass index should be between 23-30. Your Body mass index is 30.64 kg/m. If this is out of the aforementioned range listed, please consider follow up with your Primary Care Provider.  If you are age 60 or younger, your body mass index should be between 19-25. Your Body mass index is 30.64 kg/m. If this is out of the aformentioned range listed, please consider follow up with your Primary Care Provider.

## 2018-03-18 NOTE — Progress Notes (Addendum)
Subjective:   Patient ID: Susan Keith    DOB: [birth year], 72 y.o. female   MRN: [id number]  CC: routine physical, health maintenance, prediabetes, left hip pain   HPI: Susan Keith is a 72 y.o. female who presents to clinic today for the following issue.  Prediabetes  Patient concerned about being prediabetic and would like her sugars checked today.  She takes Metformin 750 mg daily with breakfast, reports good compliance.  No symptoms of polyuria or polydipsia.  She denies any hypoglycemic episodes.  Left hip pain Ongoing and chronic in right hip which has resolved.  Now has been having similar pain in her left hip.  Symptoms began about 2-3 months ago. History of fall in 2017 where she broke her arm but no hip pain at that time.  Pain does not radiate into her lower legs or feet.  When she lays down, she needs to frequently change position to make hip feel better.  Pain is 8/10 in severity.  Uses an arthritis ointment with aloe vera at home, has not tried any heating pads or ice.    Health maintenance:  -due for flu shot  -due for pna vaccine  -due for mammogram   ROS: no fever, chills, nausea, vomiting.  No CP, SOB.  +joint pain.  Social:  Pt is a current daily smoker.  Medications reviewed. Objective:   BP 122/72   Pulse 70   Temp 98.5 F (36.9 C) (Oral)   Ht 5\' 1"  (1.549 m)   Wt 149 lb (67.6 kg)   SpO2 96%   BMI 28.15 kg/m  Vitals and nursing note reviewed.  General: 72 yo female, NAD  HEENT: NCAT, EOMI, PERRL, MMM, oropharynx clear Neck: supple CV: RRR no MRG  Lungs: CTAB, normal effort  Abdomen: soft, NTND, +bs   Left Hip: ROM normal. Strength is 5/5 with internal and external rotation, abduction and adduction compared to right side.  Pelvic alignment unremarkable to inspection and palpation. Standing hip rotation and gait without trendelenburg / unsteadiness. Greater trochanter without tenderness to palpation. No tenderness over piriformis and greater  trochanter. No SI joint tenderness and normal minimal SI movement.  Skin: warm, dry, no rash Extremities: warm and well perfused, normal tone Neuro: alert, oriented x3, no focal deficits   Assessment & Plan:   Left hip pain Similar in nature to previous right hip pain which has since improved.  Likely 2/2 OA of her hip.  Exam within normal limits with strength and ROM intact.  Do not suspect impingement syndrome or trochanteric bursitis at this time.  Will obtain plain films to evaluate.   Prediabetes Taking Metformin 750 mg daily. Counseled on diet and exercise. Check A1c today.   Health maintenance:  - flu shot given   -pna vaccine given -screening mammogram orders placed, pt is to call and schedule -bloodwork ordered: lipid panel, A1c, CBC, BMP  Orders Placed This Encounter  Procedures  . XR HIP UNILAT W OR W/O PELVIS 2-3 VIEWS LEFT    Order Specific Question:   Reason for Exam (SYMPTOM  OR DIAGNOSIS REQUIRED)    Answer:   Left hip pain    Order Specific Question:   Preferred imaging location?    Answer:   [place]    MCR/MCD PF:    Standing Status:   Future    Standing Expiration Date:   06/19/2019    Order Specific Question:   Reason for Exam (  SYMPTOM  OR DIAGNOSIS REQUIRED)    Answer:   72 year old screening exam    Order Specific Question:   Preferred imaging location?    Answer:   [facility]  . Flu Vaccine QUAD 36+ mos IM  . Pneumococcal conjugate vaccine 13-valent  . Lipid panel  . Basic metabolic panel  . CBC  . POCT glycosylated hemoglobin (Hb A1C)    Lovenia Kim, MD [place], PGY-3 03/24/2018 9:44 PM

## 2018-03-19 LAB — BASIC METABOLIC PANEL
BUN/Creatinine Ratio: 24 (ref 12–28)
BUN: 24 mg/dL (ref 8–27)
CALCIUM: 9.6 mg/dL (ref 8.7–10.3)
CHLORIDE: 106 mmol/L (ref 96–106)
CO2: 24 mmol/L (ref 20–29)
Creatinine, Ser: 1.02 mg/dL — ABNORMAL HIGH (ref 0.57–1.00)
GFR calc Af Amer: 64 mL/min/{1.73_m2} (ref >59)
GFR calc non Af Amer: 56 mL/min/{1.73_m2} — ABNORMAL LOW (ref >59)
Glucose: 109 mg/dL — ABNORMAL HIGH (ref 65–99)
POTASSIUM: 5.1 mmol/L (ref 3.5–5.2)
Sodium: 146 mmol/L — ABNORMAL HIGH (ref 134–144)

## 2018-03-19 LAB — CBC
HEMOGLOBIN: 13.4 g/dL (ref 11.1–15.9)
Hematocrit: 40.5 % (ref 34.0–46.6)
MCH: 28.2 pg (ref 26.6–33.0)
MCHC: 33.1 g/dL (ref 31.5–35.7)
MCV: 85 fL (ref 79–97)
Platelets: 304 10*3/uL (ref 150–450)
RBC: 4.75 x10E6/uL (ref 3.77–5.28)
RDW: 13.3 % (ref 12.3–15.4)
WBC: 6.3 10*3/uL (ref 3.4–10.8)

## 2018-03-19 LAB — LIPID PANEL
CHOL/HDL RATIO: 3.7 ratio (ref 0.0–4.4)
Cholesterol, Total: 216 mg/dL — ABNORMAL HIGH (ref 100–199)
HDL: 58 mg/dL (ref >39)
LDL CALC: 133 mg/dL — AB (ref 0–99)
Triglycerides: 125 mg/dL (ref 0–149)
VLDL Cholesterol Cal: 25 mg/dL (ref 5–40)

## 2018-03-24 DIAGNOSIS — M25552 Pain in left hip: Secondary | ICD-10-CM | POA: Insufficient documentation

## 2018-03-24 NOTE — Assessment & Plan Note (Addendum)
Taking Metformin 750 mg daily. Counseled on diet and exercise. Check A1c today.

## 2018-03-24 NOTE — Assessment & Plan Note (Signed)
Similar in nature to previous right hip pain which has since improved.  Likely 2/2 OA of her hip.  Exam within normal limits with strength and ROM intact.  Do not suspect impingement syndrome or trochanteric bursitis at this time.  Will obtain plain films to evaluate.

## 2018-03-27 ENCOUNTER — Other Ambulatory Visit: Payer: Self-pay | Admitting: Family Medicine

## 2018-04-03 DIAGNOSIS — H6983 Other specified disorders of Eustachian tube, bilateral: Secondary | ICD-10-CM | POA: Diagnosis not present

## 2018-04-03 DIAGNOSIS — R110 Nausea: Secondary | ICD-10-CM | POA: Diagnosis not present

## 2018-04-03 DIAGNOSIS — E119 Type 2 diabetes mellitus without complications: Secondary | ICD-10-CM | POA: Diagnosis not present

## 2018-04-03 DIAGNOSIS — I10 Essential (primary) hypertension: Secondary | ICD-10-CM | POA: Diagnosis not present

## 2018-04-10 DIAGNOSIS — R110 Nausea: Secondary | ICD-10-CM | POA: Diagnosis not present

## 2018-04-10 DIAGNOSIS — R855 Abnormal microbiological findings in specimens from digestive organs and abdominal cavity: Secondary | ICD-10-CM | POA: Diagnosis not present

## 2018-04-10 DIAGNOSIS — E039 Hypothyroidism, unspecified: Secondary | ICD-10-CM | POA: Diagnosis not present

## 2018-04-10 DIAGNOSIS — E119 Type 2 diabetes mellitus without complications: Secondary | ICD-10-CM | POA: Diagnosis not present

## 2018-04-14 DIAGNOSIS — E785 Hyperlipidemia, unspecified: Secondary | ICD-10-CM | POA: Diagnosis not present

## 2018-04-14 DIAGNOSIS — I10 Essential (primary) hypertension: Secondary | ICD-10-CM | POA: Diagnosis not present

## 2018-04-14 DIAGNOSIS — K219 Gastro-esophageal reflux disease without esophagitis: Secondary | ICD-10-CM | POA: Diagnosis not present

## 2018-04-14 DIAGNOSIS — E119 Type 2 diabetes mellitus without complications: Secondary | ICD-10-CM | POA: Diagnosis not present

## 2018-04-14 DIAGNOSIS — E039 Hypothyroidism, unspecified: Secondary | ICD-10-CM | POA: Diagnosis not present

## 2018-04-20 DIAGNOSIS — I10 Essential (primary) hypertension: Secondary | ICD-10-CM | POA: Diagnosis not present

## 2018-04-20 DIAGNOSIS — L989 Disorder of the skin and subcutaneous tissue, unspecified: Secondary | ICD-10-CM | POA: Diagnosis not present

## 2018-04-20 DIAGNOSIS — D2239 Melanocytic nevi of other parts of face: Secondary | ICD-10-CM | POA: Diagnosis not present

## 2018-04-20 DIAGNOSIS — E119 Type 2 diabetes mellitus without complications: Secondary | ICD-10-CM | POA: Diagnosis not present

## 2018-04-22 ENCOUNTER — Other Ambulatory Visit (HOSPITAL_COMMUNITY): Payer: Self-pay | Admitting: Family Medicine

## 2018-04-22 DIAGNOSIS — R110 Nausea: Secondary | ICD-10-CM

## 2018-04-30 ENCOUNTER — Encounter (HOSPITAL_COMMUNITY)
Admission: RE | Admit: 2018-04-30 | Discharge: 2018-04-30 | Disposition: A | Discharge status: Home / Self Care | Payer: Medicare Other | Source: Ambulatory Visit | Attending: Family Medicine | Admitting: Family Medicine

## 2018-04-30 DIAGNOSIS — R1906 Epigastric swelling, mass or lump: Secondary | ICD-10-CM | POA: Diagnosis not present

## 2018-04-30 DIAGNOSIS — R110 Nausea: Secondary | ICD-10-CM | POA: Insufficient documentation

## 2018-04-30 MED ORDER — TECHNETIUM TC 99M SULFUR COLLOID FILTERED
2.0000 | Freq: Once | INTRAVENOUS | Status: AC | PRN
  Administered 2018-04-30: 2 via INTRADERMAL

## 2018-05-12 ENCOUNTER — Other Ambulatory Visit: Payer: Self-pay

## 2018-05-12 MED ORDER — AMLODIPINE BESYLATE 5 MG PO TABS: 5.0000 mg | ORAL_TABLET | Freq: Every day | ORAL | 5 refills | Status: DC

## 2018-05-13 ENCOUNTER — Other Ambulatory Visit: Payer: Self-pay | Admitting: *Deleted

## 2018-05-14 MED ORDER — LISINOPRIL 20 MG PO TABS: 20.0000 mg | ORAL_TABLET | Freq: Every day | ORAL | 3 refills | Status: DC

## 2018-06-10 ENCOUNTER — Other Ambulatory Visit: Payer: Self-pay | Admitting: Family Medicine

## 2018-08-05 ENCOUNTER — Other Ambulatory Visit: Payer: Self-pay | Admitting: Family Medicine

## 2018-08-05 DIAGNOSIS — R110 Nausea: Secondary | ICD-10-CM

## 2018-08-07 ENCOUNTER — Ambulatory Visit
Admission: RE | Admit: 2018-08-07 | Discharge: 2018-08-07 | Disposition: A | Discharge status: Home / Self Care | Payer: Medicare Other | Source: Ambulatory Visit | Attending: Family Medicine | Admitting: Family Medicine

## 2018-08-07 ENCOUNTER — Other Ambulatory Visit: Payer: Self-pay

## 2018-08-07 DIAGNOSIS — R110 Nausea: Secondary | ICD-10-CM

## 2018-10-01 ENCOUNTER — Other Ambulatory Visit: Payer: Self-pay | Admitting: General Surgery

## 2018-10-01 ENCOUNTER — Other Ambulatory Visit (HOSPITAL_COMMUNITY): Payer: Self-pay | Admitting: General Surgery

## 2018-10-01 DIAGNOSIS — K828 Other specified diseases of gallbladder: Secondary | ICD-10-CM

## 2018-10-08 ENCOUNTER — Encounter (HOSPITAL_COMMUNITY)
Admission: RE | Admit: 2018-10-08 | Discharge: 2018-10-08 | Disposition: A | Discharge status: Home / Self Care | Payer: Medicare Other | Source: Ambulatory Visit | Attending: General Surgery | Admitting: General Surgery

## 2018-10-08 ENCOUNTER — Other Ambulatory Visit: Payer: Self-pay

## 2018-10-08 DIAGNOSIS — K828 Other specified diseases of gallbladder: Secondary | ICD-10-CM | POA: Diagnosis present

## 2018-10-08 MED ORDER — TECHNETIUM TC 99M MEBROFENIN IV KIT
5.0000 | PACK | Freq: Once | INTRAVENOUS | Status: AC | PRN
  Administered 2018-10-08: 10:00:00 5 via INTRAVENOUS

## 2018-11-05 ENCOUNTER — Encounter: Payer: Self-pay | Admitting: Internal Medicine

## 2018-11-05 ENCOUNTER — Other Ambulatory Visit: Payer: Self-pay

## 2018-11-05 ENCOUNTER — Ambulatory Visit (INDEPENDENT_AMBULATORY_CARE_PROVIDER_SITE_OTHER): Payer: Medicare Other | Admitting: Internal Medicine

## 2018-11-05 VITALS — Ht <= 58 in | Wt 151.0 lb

## 2018-11-05 DIAGNOSIS — Z5329 Procedure and treatment not carried out because of patient's decision for other reasons: Secondary | ICD-10-CM

## 2018-11-05 NOTE — Progress Notes (Signed)
The patient did not answer the only contact number provided.  Thus no show for office visit via telehealth medicine

## 2018-11-07 ENCOUNTER — Other Ambulatory Visit: Payer: Self-pay | Admitting: Family Medicine

## 2018-11-09 ENCOUNTER — Telehealth: Payer: Self-pay

## 2018-11-09 NOTE — Telephone Encounter (Signed)
Covid-19 screening questions   Do you now or have you had a fever in the last 14 days no   Do you have any respiratory symptoms of shortness of breath or cough now or in the last 14 days no  Do you have any family members or close contacts with diagnosed or suspected Covid-19 in the past 14 days no  Have you been tested for Covid-19 and found to be positive no          

## 2018-11-10 ENCOUNTER — Ambulatory Visit (INDEPENDENT_AMBULATORY_CARE_PROVIDER_SITE_OTHER): Payer: Medicare Other | Admitting: Physician Assistant

## 2018-11-10 ENCOUNTER — Encounter: Payer: Self-pay | Admitting: Physician Assistant

## 2018-11-10 VITALS — BP 114/68 | Temp 98.2°F | Ht 60.0 in | Wt 171.0 lb

## 2018-11-10 DIAGNOSIS — R110 Nausea: Secondary | ICD-10-CM

## 2018-11-10 DIAGNOSIS — K449 Diaphragmatic hernia without obstruction or gangrene: Secondary | ICD-10-CM | POA: Diagnosis not present

## 2018-11-10 DIAGNOSIS — Z86010 Personal history of colonic polyps: Secondary | ICD-10-CM

## 2018-11-10 DIAGNOSIS — R1013 Epigastric pain: Secondary | ICD-10-CM | POA: Diagnosis not present

## 2018-11-10 DIAGNOSIS — R1906 Epigastric swelling, mass or lump: Secondary | ICD-10-CM

## 2018-11-10 MED ORDER — NA SULFATE-K SULFATE-MG SULF 17.5-3.13-1.6 GM/177ML PO SOLN: ORAL | 0 refills | Status: DC

## 2018-11-10 MED ORDER — OMEPRAZOLE 40 MG PO CPDR: DELAYED_RELEASE_CAPSULE | ORAL | 11 refills | Status: DC

## 2018-11-10 NOTE — Patient Instructions (Addendum)
If you are age 71 or older, your body mass index should be between 23-30. Your Body mass index is 33.4 kg/m. If this is out of the aforementioned range listed, please consider follow up with your Primary Care Provider.  If you are age 82 or younger, your body mass index should be between 19-25. Your Body mass index is 33.4 kg/m. If this is out of the aformentioned range listed, please consider follow up with your Primary Care Provider.   You have been scheduled for an endoscopy and colonoscopy. Please follow the written instructions given to you at your visit today. Please pick up your prep supplies at the pharmacy within the next 1-3 days. If you use inhalers (even only as needed), please bring them with you on the day of your procedure. Your physician has requested that you go to www.startemmi.com and enter the access code given to you at your visit today. This web site gives a general overview about your procedure. However, you should still follow specific instructions given to you by our office regarding your preparation for the procedure.  We have sent the following medications to your pharmacy for you to pick up at your convenience: Suprep  HOLD Metformin the morning of your procedure. . Continue Omeprazole 40 mg every morning.  You have been scheduled for an Upper GI Series at [facility]. Your appointment is on 11/18/18 at 11 am. Please arrive 15 minutes prior to your test for registration. Make certain not to have anything to eat or drink after midnight on the night before your test. If you need to reschedule, please contact radiology at [phone]. --------------------------------------------------------------------------------------------------------------- An upper GI series uses x rays to help diagnose problems of the upper GI tract, which includes the esophagus, stomach, and duodenum. The duodenum is the first part of the small intestine. An upper GI series is conducted  by a radiology technologist or a radiologist-a doctor who specializes in x-ray imaging-at a hospital or outpatient center. While sitting or standing in front of an x-ray machine, the patient drinks barium liquid, which is often white and has a chalky consistency and taste. The barium liquid coats the lining of the upper GI tract and makes signs of disease show up more clearly on x rays. X-ray video, called fluoroscopy, is used to view the barium liquid moving through the esophagus, stomach, and duodenum. Additional x rays and fluoroscopy are performed while the patient lies on an x-ray table. To fully coat the upper GI tract with barium liquid, the technologist or radiologist may press on the abdomen or ask the patient to change position. Patients hold still in various positions, allowing the technologist or radiologist to take x rays of the upper GI tract at different angles. If a technologist conducts the upper GI series, a radiologist will later examine the images to look for problems.  This test typically takes about 1 hour to complete   Thank you for choosing me and [facility].   Amy Esterwood, PA-C

## 2018-11-11 ENCOUNTER — Encounter: Payer: Self-pay | Admitting: Physician Assistant

## 2018-11-11 NOTE — Progress Notes (Signed)
Subjective:    Patient ID: Susan Keith, female    DOB: 23-Dec-1945, 73 y.o.   MRN: [id number]  HPI [name] is a pleasant 73 year old [place] female, known to Dr. Marina Goodell who was last seen in the office in October 2019.  She has history of chronic GERD, hypertension, adult onset diabetes mellitus, and hyperlipidemia.  Also with prior history of colon polyps 2013/[place] Patient underwent endoscopy with Dr. Marina Goodell on 12/16/2017 which showed mild distal esophagitis, a hiatal hernia and probable component of paraesophageal hernia.  CLOtest was done and negative. Subsequent upper GI August 2019 showed S-shaped scoliosis, a moderate hiatal hernia with the GE junction above the diaphragm consistent with a paraesophageal component of her hernia..  Also spontaneous reflux. Because of complaints of upper abdominal fullness and discomfort she underwent gastric emptying scan per her PCP in December 2019 which was normal. Upper abdominal ultrasound March 2020 showed no evidence of gallstones or some areas of gallbladder wall thickening raising question of a calculus cholecystitis, CCK HIDA scan per [facility] surgery was normal. Patient says she has had recurrent symptoms over the past year, and has symptoms on a daily basis, frequently present in the early morning.  She says she is not really having any abdominal pain but feels a fullness in the epigastrium which is fairly constant.  She is also developed nausea which has become persistent.  Appetite is been okay, weight stable.  No dysphagia or odynophagia, bowel movements normal without melena or hematochezia. Over the past few weeks she has had some mornings where nausea has been accompanied by an episode of vomiting. She continues on omeprazole 40 mg p.o. daily and says she has an antiemetic at home but does not know the name of it and uses it only sparingly if the nausea is severe. Review of Dr. Lamar Sprinkles notes state patient should have a colonoscopy  in 2020  Review of Systems Pertinent positive and negative review of systems were noted in the above HPI section.  All other review of systems was otherwise negative.  Outpatient Encounter Medications as of 11/10/2018  Medication Sig  . amLODipine (NORVASC) 5 MG tablet TAKE 1 TABLET(5 MG) BY MOUTH DAILY  . atorvastatin (LIPITOR) 20 MG tablet Take 1 tablet (20 mg total) by mouth daily.  . Cholecalciferol (VITAMIN D3) 5000 units CAPS Take by mouth.  . fluticasone (FLONASE) 50 MCG/ACT nasal spray Place 2 sprays into both nostrils daily.  [name] levothyroxine (SYNTHROID) 75 MCG tablet Take 75 mcg by mouth daily before breakfast.  . lisinopril (PRINIVIL,ZESTRIL) 20 MG tablet Take 1 tablet (20 mg total) by mouth daily.  . metFORMIN (GLUCOPHAGE-XR) 750 MG 24 hr tablet TAKE 1 TABLET BY MOUTH EVERY DAY WITH BREAKFAST  . omeprazole (PRILOSEC) 40 MG capsule TAKE 1 CAPSULE BY MOUTH EVERY DAY  . [DISCONTINUED] omeprazole (PRILOSEC) 40 MG capsule TAKE 1 CAPSULE BY MOUTH EVERY DAY  . Na Sulfate-K Sulfate-Mg Sulf 17.5-3.13-1.6 GM/177ML SOLN Suprep-Use as directed   No facility-administered encounter medications on file as of 11/10/2018.    No Known Allergies Patient Active Problem List   Diagnosis Date Noted  . Left hip pain 03/24/2018  . Prediabetes 05/02/2017  . Thyroid disorder screen 05/02/2017  . Abdominal bloating 02/04/2017  . Cyst of left ovary 02/04/2017  . Urge incontinence of urine 02/04/2017  . Hyponatremia 01/07/2017  . Constipation   . Essential hypertension   . Nausea without vomiting 12/29/2016  . Right hip pain 09/23/2016  . Back pain 09/09/2016  Social History   Socioeconomic History  . Marital status: Married    Spouse name: Not on file  . Number of children: Not on file  . Years of education: Not on file  . Highest education level: Not on file  Occupational History  . Not on file  Social Needs  . Financial resource strain: Not on file  . Food insecurity    Worry: Not on  file    Inability: Not on file  . Transportation needs    Medical: Not on file    Non-medical: Not on file  Tobacco Use  . Smoking status: Current Every Day Smoker    Packs/day: 0.25    Years: 48.00    Pack years: 12.00    Types: Cigarettes  . Smokeless tobacco: Never Used  Substance and Sexual Activity  . Alcohol use: No  . Drug use: No  . Sexual activity: Not on file  Lifestyle  . Physical activity    Days per week: Not on file    Minutes per session: Not on file  . Stress: Not on file  Relationships  . Social Musician on phone: Not on file    Gets together: Not on file    Attends religious service: Not on file    Active member of club or organization: Not on file    Attends meetings of clubs or organizations: Not on file    Relationship status: Not on file  . Intimate partner violence    Fear of current or ex partner: Not on file    Emotionally abused: Not on file    Physically abused: Not on file    Forced sexual activity: Not on file  Other Topics Concern  . Not on file  Social History Narrative  . Not on file    Ms. Borner's family history is not on file.      Objective:    Vitals:   11/10/18 1408  BP: 114/68  Temp: 98.2 F (36.8 C)    Physical Exam; Well-developed well-nourished older white female in no acute distress.  Height, Weight, BMI .  Patient accompanied by [place] interpreter HEENT; nontraumatic normocephalic, EOMI, PE R LA, sclera anicteric. Oropharynx; Not examined/patient wearing mask/COVID Neck; supple, no JVD Cardiovascular; regular rate and rhythm with S1-S2, no murmur rub or gallop Pulmonary; Clear bilaterally Abdomen; soft, mild tenderness high in the epigastrium, nondistended, no palpable mass or hepatosplenomegaly, bowel sounds are active Rectal; Not done Skin; benign exam, no jaundice rash or appreciable lesions Extremities; no clubbing cyanosis or edema skin warm and dry Neuro/Psych; alert and oriented x4,  grossly nonfocal mood and affect appropriate       Assessment & Plan:   #24 73 year old female with persistent complaints of epigastric/subxiphoid fullness and discomfort, now associated with few month history of frequent nausea. She has had fairly extensive GI work-up as outlined above. I think it is very likely that her symptoms are secondary to the paraesophageal hernia which had been documented last summer.  Query whether paraesophageal component may have progressed.  #2 prior history of colon polyps/last colonoscopy Clar[facility] Gower 2013-due for colonoscopy 2020 #3 hypertension #4.  Adult onset diabetes mellitus #5.  Hyperlipidemia  Plan; I had a long talk with the patient regarding her symptoms and prior endoscopy and upper GI findings, and showed her images as well.  She seems to have a better understanding. I think repeating upper GI is reasonable at this time to see if the paraesophageal  component of the hernia is larger. Ice patient that she may require surgery for correction. Schedule for Colonoscopy, and upper Endoscopy with Dr. Marina Goodell.  Both procedures were discussed in detail with the patient including indications risks and benefits and she is agreeable to proceed.  Depending on results of upper GI, we may be able to cancel the upper endoscopy.  Patient will continue omeprazole 40 mg p.o. every morning Continue antiemetic PRN. Further recommendations pending findings of above.  Phuong Hillary Oswald Hillock PA-C 11/11/2018   Cc: Freddrick March, MD

## 2018-11-16 NOTE — Progress Notes (Signed)
Assessment and plan reviewed 

## 2018-11-18 ENCOUNTER — Ambulatory Visit (HOSPITAL_COMMUNITY)
Admission: RE | Admit: 2018-11-18 | Discharge: 2018-11-18 | Disposition: A | Discharge status: Home / Self Care | Payer: Medicare Other | Source: Ambulatory Visit | Attending: Physician Assistant | Admitting: Physician Assistant

## 2018-11-18 ENCOUNTER — Other Ambulatory Visit: Payer: Self-pay | Admitting: Physician Assistant

## 2018-11-18 ENCOUNTER — Other Ambulatory Visit: Payer: Self-pay

## 2018-11-18 DIAGNOSIS — R1013 Epigastric pain: Secondary | ICD-10-CM

## 2018-11-18 DIAGNOSIS — K449 Diaphragmatic hernia without obstruction or gangrene: Secondary | ICD-10-CM

## 2018-11-18 DIAGNOSIS — R110 Nausea: Secondary | ICD-10-CM

## 2018-11-18 DIAGNOSIS — R1906 Epigastric swelling, mass or lump: Secondary | ICD-10-CM | POA: Diagnosis present

## 2018-11-23 ENCOUNTER — Telehealth: Payer: Self-pay | Admitting: Internal Medicine

## 2018-11-23 NOTE — Telephone Encounter (Signed)
Spoke with daughter regarding covid-19 screening questions Covid-19 Screening Questions  Do you now or have you had a fever in the last 14 days?  no  Do you have any respiratory symptoms of shortness of breath or cough now or in the last 14 days?   no  Do you have any family members or close contacts with diagnosed or suspected Covid-19 in the past 14 days? no  Have you been tested for Covid-19 and found to be positive?  no  Pt made aware of that care partner may wait in the car or come up to the lobby during the procedure but will need to provide their own mask.

## 2018-11-24 ENCOUNTER — Other Ambulatory Visit: Payer: Self-pay

## 2018-11-24 ENCOUNTER — Encounter: Payer: Self-pay | Admitting: Internal Medicine

## 2018-11-24 ENCOUNTER — Ambulatory Visit (AMBULATORY_SURGERY_CENTER): Payer: Medicare Other | Admitting: Internal Medicine

## 2018-11-24 VITALS — BP 88/58 | HR 64 | Temp 98.6°F | Resp 20 | Ht 60.0 in | Wt 171.0 lb

## 2018-11-24 DIAGNOSIS — K449 Diaphragmatic hernia without obstruction or gangrene: Secondary | ICD-10-CM

## 2018-11-24 DIAGNOSIS — K621 Rectal polyp: Secondary | ICD-10-CM

## 2018-11-24 DIAGNOSIS — R110 Nausea: Secondary | ICD-10-CM

## 2018-11-24 DIAGNOSIS — K219 Gastro-esophageal reflux disease without esophagitis: Secondary | ICD-10-CM | POA: Diagnosis not present

## 2018-11-24 DIAGNOSIS — D129 Benign neoplasm of anus and anal canal: Secondary | ICD-10-CM

## 2018-11-24 DIAGNOSIS — R1013 Epigastric pain: Secondary | ICD-10-CM

## 2018-11-24 DIAGNOSIS — Z86010 Personal history of colonic polyps: Secondary | ICD-10-CM

## 2018-11-24 DIAGNOSIS — D128 Benign neoplasm of rectum: Secondary | ICD-10-CM

## 2018-11-24 DIAGNOSIS — R1906 Epigastric swelling, mass or lump: Secondary | ICD-10-CM

## 2018-11-24 DIAGNOSIS — D122 Benign neoplasm of ascending colon: Secondary | ICD-10-CM

## 2018-11-24 MED ORDER — SODIUM CHLORIDE 0.9 % IV SOLN: 500.0000 mL | Freq: Once | INTRAVENOUS | Status: DC

## 2018-11-24 MED ORDER — OMEPRAZOLE 40 MG PO CPDR: 40.0000 mg | DELAYED_RELEASE_CAPSULE | Freq: Two times a day (BID) | ORAL | 11 refills | Status: DC

## 2018-11-24 NOTE — Op Note (Signed)
[facility] Patient Name: Susan Keith Procedure Date: 11/24/2018 2:37 PM MRN: [id number] Endoscopist: Docia Chuck. Henrene Pastor , MD Age: 73 Referring MD:  Date of Birth: [birth year] Gender: Female Account #: [id number] Procedure:                Upper GI endoscopy Indications:              Nausea. NO vomiting. NO abdominal pain. Has had                            weight gain. Noted to have paraesophageal hernia                            stable on barium radiology. Noted to have GERD.                            Normal gastric emptying scan. Negative abdominal                            ultrasound and HIDA scan. Negative surgical                            evaluation Medicines:                Monitored Anesthesia Care Procedure:                Pre-Anesthesia Assessment:                           - Prior to the procedure, a History and Physical                            was performed, and patient medications and                            allergies were reviewed. The patient's tolerance of                            previous anesthesia was also reviewed. The risks                            and benefits of the procedure and the sedation                            options and risks were discussed with the patient.                            All questions were answered, and informed consent                            was obtained. Prior Anticoagulants: The patient has                            taken no previous anticoagulant or antiplatelet  agents. ASA Grade Assessment: II - A patient with                            mild systemic disease. After reviewing the risks                            and benefits, the patient was deemed in                            satisfactory condition to undergo the procedure.                           After obtaining informed consent, the endoscope was                            passed under direct vision. Throughout the                     procedure, the patient's blood pressure, pulse, and                            oxygen saturations were monitored continuously. The                            Endoscope was introduced through the mouth, and                            advanced to the second part of duodenum. The upper                            GI endoscopy was accomplished without difficulty.                            The patient tolerated the procedure well. Scope In: Scope Out: Findings:                 The esophagus was normal.                           The stomach revealed a nonobstructing                            paraesophageal hernia as previous. The exam was                            otherwise normal.                           The examined duodenum was normal.                           The cardia and gastric fundus were normal on                            retroflexion. Complications:            No immediate complications. Estimated  Blood Loss:     Estimated blood loss: none. Impression:               1. GERD                           2. Stable paraesophageal hernia. I am not convinced                            at this point that this is participating in her                            symptoms or needs surgical attention. Continue to                            monitor                           3. Otherwise normal exam. Recommendation:           1. Increase omeprazole to 40 mg twice daily; #60;                            11 refills                           2. Use your nausea medicine at home as needed                           3. GI follow-up in 2 months. Docia Chuck. Henrene Pastor, MD 11/24/2018 3:16:20 PM This report has been signed electronically.

## 2018-11-24 NOTE — Progress Notes (Signed)
Temp by Katha Cabal or June Vitals by Berneice Gandy

## 2018-11-24 NOTE — Progress Notes (Signed)
Called to room to assist during endoscopic procedure.  Patient ID and intended procedure confirmed with present staff. Received instructions for my participation in the procedure from the performing physician.  

## 2018-11-24 NOTE — Patient Instructions (Signed)
YOU HAD AN ENDOSCOPIC PROCEDURE TODAY AT THE [facility]:   Refer to the procedure report that was given to you for any specific questions about what was found during the examination.  If the procedure report does not answer your questions, please call your gastroenterologist to clarify.  If you requested that your care partner not be given the details of your procedure findings, then the procedure report has been included in a sealed envelope for you to review at your convenience later.  YOU SHOULD EXPECT: Some feelings of bloating in the abdomen. Passage of more gas than usual.  Walking can help get rid of the air that was put into your GI tract during the procedure and reduce the bloating. If you had a lower endoscopy (such as a colonoscopy or flexible sigmoidoscopy) you may notice spotting of blood in your stool or on the toilet paper. If you underwent a bowel prep for your procedure, you may not have a normal bowel movement for a few days.  Please Note:  You might notice some irritation and congestion in your nose or some drainage.  This is from the oxygen used during your procedure.  There is no need for concern and it should clear up in a day or so.  SYMPTOMS TO REPORT IMMEDIATELY:   Following lower endoscopy (colonoscopy or flexible sigmoidoscopy):  Excessive amounts of blood in the stool  Significant tenderness or worsening of abdominal pains  Swelling of the abdomen that is new, acute  Fever of 100F or higher   Following upper endoscopy (EGD)  Vomiting of blood or coffee ground material  New chest pain or pain under the shoulder blades  Painful or persistently difficult swallowing  New shortness of breath  Fever of 100F or higher  Black, tarry-looking stools  For urgent or emergent issues, a gastroenterologist can be reached at any hour by calling [phone].   DIET:  We do recommend a small meal at first, but then you may proceed to your regular diet.  Drink  plenty of fluids but you should avoid alcoholic beverages for 24 hours.  ACTIVITY:  You should plan to take it easy for the rest of today and you should NOT DRIVE or use heavy machinery until tomorrow (because of the sedation medicines used during the test).    FOLLOW UP: Our staff will call the number listed on your records 48-72 hours following your procedure to check on you and address any questions or concerns that you may have regarding the information given to you following your procedure. If we do not reach you, we will leave a message.  We will attempt to reach you two times.  During this call, we will ask if you have developed any symptoms of COVID 19. If you develop any symptoms (ie: fever, flu-like symptoms, shortness of breath, cough etc.) before then, please call [phone].  If you test positive for Covid 19 in the 2 weeks post procedure, please call and report this information to us.    If any biopsies were taken you will be contacted by phone or by letter within the next 1-3 weeks.  Please call us at [phone] if you have not heard about the biopsies in 3 weeks.    SIGNATURES/CONFIDENTIALITY: You and/or your care partner have signed paperwork which will be entered into your electronic medical record.  These signatures attest to the fact that that the information above on your After Visit Summary has been reviewed and is   understood.  Full responsibility of the confidentiality of this discharge information lies with you and/or your care-partner.     Handouts were given to you on polyps and GERD.[name] Blood sugar was 85 in the recovery room. Increase OMEPRAZOLE 40 mg twice daily.  Rx was sent to [facility]. Use your nausea medication at home as needed. You may resume your other current medications today. Await biopsy results. Repeat colonoscopy in 5 years for surveillance. The office will call you to set up GI follow -up appointment for 2 months. Please call if any questions  or concerns.

## 2018-11-24 NOTE — Op Note (Signed)
[place] Patient Name: Susan Keith Procedure Date: 11/24/2018 2:38 PM MRN: [id number] Endoscopist: Docia Chuck. Henrene Pastor , MD Age: 73 Referring MD:  Date of Birth: Nov 22, 1945 Gender: Female Account #: [id number] Procedure:                Colonoscopy with cold snare polypectomy x 5 Indications:              High risk colon cancer surveillance: Personal                            history of multiple (3 or more) adenomas. Previous                            examination [facility] 2017 Medicines:                Monitored Anesthesia Care Procedure:                Pre-Anesthesia Assessment:                           - Prior to the procedure, a History and Physical                            was performed, and patient medications and                            allergies were reviewed. The patient's tolerance of                            previous anesthesia was also reviewed. The risks                            and benefits of the procedure and the sedation                            options and risks were discussed with the patient.                            All questions were answered, and informed consent                            was obtained. Prior Anticoagulants: The patient has                            taken no previous anticoagulant or antiplatelet                            agents. ASA Grade Assessment: II - A patient with                            mild systemic disease. After reviewing the risks                            and benefits, the patient was deemed in  satisfactory condition to undergo the procedure.                           After obtaining informed consent, the colonoscope                            was passed under direct vision. Throughout the                            procedure, the patient's blood pressure, pulse, and                            oxygen saturations were monitored continuously. The              Colonoscope was introduced through the anus and                            advanced to the the cecum, identified by                            appendiceal orifice and ileocecal valve. The                            ileocecal valve, appendiceal orifice, and rectum                            were photographed. The quality of the bowel                            preparation was excellent. The colonoscopy was                            performed without difficulty. The patient tolerated                            the procedure well. The bowel preparation used was                            SUPREP via split dose instruction. Scope In: 2:44:12 PM Scope Out: 2:58:52 PM Scope Withdrawal Time: 0 hours 12 minutes 28 seconds  Total Procedure Duration: 0 hours 14 minutes 40 seconds  Findings:                 Five polyps were found in the rectum and ascending                            colon. The polyps were 2 to 4 mm in size. These                            polyps were removed with a cold snare. Resection                            and retrieval were complete.  Mild diffuse melanosis coli throughout. The exam                            was otherwise without abnormality on direct and                            retroflexion views. Complications:            No immediate complications. Estimated blood loss:                            None. Estimated Blood Loss:     Estimated blood loss: none. Impression:               - Five 2 to 4 mm polyps in the rectum and in the                            ascending colon, removed with a cold snare.                            Resected and retrieved.                           - Melanosis coli                           - The examination was otherwise normal on direct                            and retroflexion views. Recommendation:           - Repeat colonoscopy in 5 years for surveillance.                           - Patient has  a contact number available for                            emergencies. The signs and symptoms of potential                            delayed complications were discussed with the                            patient. Return to normal activities tomorrow.                            Written discharge instructions were provided to the                            patient.                           - Resume previous diet.                           - Continue present medications.                           -  Await pathology results. Docia Chuck. Henrene Pastor, MD 11/24/2018 3:12:24 PM This report has been signed electronically.

## 2018-11-24 NOTE — Progress Notes (Signed)
PT taken to PACU. Monitors in place. VSS. Report given to RN. 

## 2018-11-24 NOTE — Progress Notes (Signed)
Interpreter from [facility] assisted with translating [place].    No problems noted in the recovery room. maw

## 2018-11-26 ENCOUNTER — Telehealth: Payer: Self-pay | Admitting: *Deleted

## 2018-11-26 NOTE — Telephone Encounter (Signed)
  Follow up Call-  Call back number 11/24/2018 12/16/2017  Post procedure Call Back phone  # call daughter Susan Keith (pt speaks russian) [phone] daughter- Susan Keith  Permission to leave phone message Yes Yes     Patient questions:  Do you have a fever, pain , or abdominal swelling? No. Pain Score  0 *  Have you tolerated food without any problems? Yes.    Have you been able to return to your normal activities? Yes.    Do you have any questions about your discharge instructions: Diet   No. Medications  No. Follow up visit  No.  Do you have questions or concerns about your Care? No.  Actions: * If pain score is 4 or above: No action needed, pain <4.  1. Have you developed a fever since your procedure? NO  2.   Have you had an respiratory symptoms (SOB or cough) since your procedure? NO  3.   Have you tested positive for COVID 19 since your procedure NO  4.   Have you had any family members/close contacts diagnosed with the COVID 19 since your procedure?  NO   If yes to any of these questions please route to Joylene John, RN and Alphonsa Gin, RN.

## 2018-12-01 ENCOUNTER — Encounter: Payer: Self-pay | Admitting: Internal Medicine

## 2018-12-02 ENCOUNTER — Other Ambulatory Visit: Payer: Self-pay

## 2018-12-30 ENCOUNTER — Encounter: Payer: Self-pay | Admitting: Internal Medicine

## 2019-02-05 ENCOUNTER — Other Ambulatory Visit: Payer: Self-pay | Admitting: *Deleted

## 2019-02-08 MED ORDER — ATORVASTATIN CALCIUM 20 MG PO TABS: 20.0000 mg | ORAL_TABLET | Freq: Every day | ORAL | 3 refills | Status: DC

## 2019-05-10 ENCOUNTER — Other Ambulatory Visit: Payer: Self-pay

## 2019-05-12 NOTE — Telephone Encounter (Signed)
Please contact patient to make an appointment to discuss medication refill.

## 2019-05-12 NOTE — Telephone Encounter (Signed)
Called and LVM for patient to call office and make an appointment for refill per Dr. Ouida Sills.  Marylou Flesher from [employer] was used ID # [id number].  Ozella Almond, CMA'

## 2019-05-19 ENCOUNTER — Other Ambulatory Visit: Payer: Self-pay

## 2019-05-24 ENCOUNTER — Other Ambulatory Visit: Payer: Self-pay | Admitting: *Deleted

## 2019-05-25 NOTE — Telephone Encounter (Signed)
Called patient using [employer] interpreter[place] ID # [id number].  There was no answer.  A voice message was left informing patient that an appointment is needed to get a refill on medication per Dr. Ouida Sills.  When patient calls back, please schedule her an appointment.  Ozella Almond, [place]

## 2019-05-31 ENCOUNTER — Other Ambulatory Visit: Payer: Self-pay | Admitting: *Deleted

## 2019-06-02 NOTE — Telephone Encounter (Signed)
Please call patient to let her know that she will need an appointment for further medication refills. Thank you.

## 2019-06-07 DIAGNOSIS — E7800 Pure hypercholesterolemia, unspecified: Secondary | ICD-10-CM | POA: Insufficient documentation

## 2019-06-07 DIAGNOSIS — E119 Type 2 diabetes mellitus without complications: Secondary | ICD-10-CM | POA: Insufficient documentation

## 2019-06-08 NOTE — Telephone Encounter (Signed)
LMOVM using uk[place] interpreter Michelene Heady [phone] for pt to call back and schedule an appt beofr emeds can be refilled. Susan Keith Kennon Holter, CMA

## 2019-06-21 DIAGNOSIS — M4150 Other secondary scoliosis, site unspecified: Secondary | ICD-10-CM | POA: Insufficient documentation

## 2019-06-21 DIAGNOSIS — M5416 Radiculopathy, lumbar region: Secondary | ICD-10-CM | POA: Insufficient documentation

## 2019-06-22 ENCOUNTER — Telehealth: Payer: Self-pay | Admitting: Student in an Organized Health Care Education/Training Program

## 2019-06-22 ENCOUNTER — Other Ambulatory Visit: Payer: Self-pay

## 2019-06-22 NOTE — Telephone Encounter (Signed)
Attempted to call patient for appointment.  There was no answer.  LVM for patient to call [facility] and schedule an appointment for blood pressure and medication management. [place] was used [place] ID # [id number].  Susan Keith, [place]

## 2019-06-22 NOTE — Telephone Encounter (Signed)
htn

## 2019-09-23 DIAGNOSIS — M5136 Other intervertebral disc degeneration, lumbar region: Secondary | ICD-10-CM | POA: Insufficient documentation

## 2020-08-02 DIAGNOSIS — B9681 Helicobacter pylori [H. pylori] as the cause of diseases classified elsewhere: Secondary | ICD-10-CM | POA: Diagnosis not present

## 2020-08-02 DIAGNOSIS — I10 Essential (primary) hypertension: Secondary | ICD-10-CM | POA: Diagnosis not present

## 2020-08-02 DIAGNOSIS — E039 Hypothyroidism, unspecified: Secondary | ICD-10-CM | POA: Diagnosis not present

## 2020-08-02 DIAGNOSIS — Z0001 Encounter for general adult medical examination with abnormal findings: Secondary | ICD-10-CM | POA: Diagnosis not present

## 2020-08-02 DIAGNOSIS — E1165 Type 2 diabetes mellitus with hyperglycemia: Secondary | ICD-10-CM | POA: Diagnosis not present

## 2020-08-02 DIAGNOSIS — M810 Age-related osteoporosis without current pathological fracture: Secondary | ICD-10-CM | POA: Diagnosis not present

## 2020-08-02 DIAGNOSIS — E559 Vitamin D deficiency, unspecified: Secondary | ICD-10-CM | POA: Diagnosis not present

## 2020-08-02 DIAGNOSIS — E785 Hyperlipidemia, unspecified: Secondary | ICD-10-CM | POA: Diagnosis not present

## 2020-08-29 DIAGNOSIS — E785 Hyperlipidemia, unspecified: Secondary | ICD-10-CM | POA: Diagnosis not present

## 2020-08-29 DIAGNOSIS — I10 Essential (primary) hypertension: Secondary | ICD-10-CM | POA: Diagnosis not present

## 2020-08-29 DIAGNOSIS — E039 Hypothyroidism, unspecified: Secondary | ICD-10-CM | POA: Diagnosis not present

## 2020-08-29 DIAGNOSIS — M810 Age-related osteoporosis without current pathological fracture: Secondary | ICD-10-CM | POA: Diagnosis not present

## 2020-09-03 DIAGNOSIS — G4739 Other sleep apnea: Secondary | ICD-10-CM | POA: Diagnosis not present

## 2020-09-03 DIAGNOSIS — G4710 Hypersomnia, unspecified: Secondary | ICD-10-CM | POA: Diagnosis not present

## 2020-09-07 DIAGNOSIS — I10 Essential (primary) hypertension: Secondary | ICD-10-CM | POA: Diagnosis not present

## 2020-09-07 DIAGNOSIS — K8020 Calculus of gallbladder without cholecystitis without obstruction: Secondary | ICD-10-CM | POA: Diagnosis not present

## 2020-09-07 DIAGNOSIS — G4733 Obstructive sleep apnea (adult) (pediatric): Secondary | ICD-10-CM | POA: Diagnosis not present

## 2020-10-17 DIAGNOSIS — R0789 Other chest pain: Secondary | ICD-10-CM | POA: Diagnosis not present

## 2020-10-17 DIAGNOSIS — I10 Essential (primary) hypertension: Secondary | ICD-10-CM | POA: Diagnosis not present

## 2020-10-17 DIAGNOSIS — R0600 Dyspnea, unspecified: Secondary | ICD-10-CM | POA: Diagnosis not present

## 2020-10-17 DIAGNOSIS — R0609 Other forms of dyspnea: Secondary | ICD-10-CM | POA: Insufficient documentation

## 2020-10-18 DIAGNOSIS — I447 Left bundle-branch block, unspecified: Secondary | ICD-10-CM | POA: Diagnosis not present

## 2020-10-26 DIAGNOSIS — I10 Essential (primary) hypertension: Secondary | ICD-10-CM | POA: Diagnosis not present

## 2020-10-26 DIAGNOSIS — F17200 Nicotine dependence, unspecified, uncomplicated: Secondary | ICD-10-CM | POA: Diagnosis not present

## 2020-12-15 DIAGNOSIS — E559 Vitamin D deficiency, unspecified: Secondary | ICD-10-CM | POA: Diagnosis not present

## 2020-12-15 DIAGNOSIS — E039 Hypothyroidism, unspecified: Secondary | ICD-10-CM | POA: Diagnosis not present

## 2020-12-15 DIAGNOSIS — Z0000 Encounter for general adult medical examination without abnormal findings: Secondary | ICD-10-CM | POA: Diagnosis not present

## 2020-12-15 DIAGNOSIS — I10 Essential (primary) hypertension: Secondary | ICD-10-CM | POA: Diagnosis not present

## 2020-12-15 DIAGNOSIS — E785 Hyperlipidemia, unspecified: Secondary | ICD-10-CM | POA: Diagnosis not present

## 2020-12-15 DIAGNOSIS — E1165 Type 2 diabetes mellitus with hyperglycemia: Secondary | ICD-10-CM | POA: Diagnosis not present

## 2020-12-15 DIAGNOSIS — Z0001 Encounter for general adult medical examination with abnormal findings: Secondary | ICD-10-CM | POA: Diagnosis not present

## 2020-12-18 DIAGNOSIS — I272 Pulmonary hypertension, unspecified: Secondary | ICD-10-CM | POA: Diagnosis not present

## 2020-12-18 DIAGNOSIS — I082 Rheumatic disorders of both aortic and tricuspid valves: Secondary | ICD-10-CM | POA: Diagnosis not present

## 2020-12-20 DIAGNOSIS — E785 Hyperlipidemia, unspecified: Secondary | ICD-10-CM | POA: Diagnosis not present

## 2020-12-20 DIAGNOSIS — I10 Essential (primary) hypertension: Secondary | ICD-10-CM | POA: Diagnosis not present

## 2020-12-20 DIAGNOSIS — R109 Unspecified abdominal pain: Secondary | ICD-10-CM | POA: Diagnosis not present

## 2020-12-20 DIAGNOSIS — E039 Hypothyroidism, unspecified: Secondary | ICD-10-CM | POA: Diagnosis not present

## 2021-01-16 DIAGNOSIS — R0600 Dyspnea, unspecified: Secondary | ICD-10-CM | POA: Diagnosis not present

## 2021-01-16 DIAGNOSIS — R0789 Other chest pain: Secondary | ICD-10-CM | POA: Diagnosis not present

## 2021-01-18 DIAGNOSIS — Z720 Tobacco use: Secondary | ICD-10-CM | POA: Insufficient documentation

## 2021-01-18 DIAGNOSIS — R0600 Dyspnea, unspecified: Secondary | ICD-10-CM | POA: Diagnosis not present

## 2021-01-18 DIAGNOSIS — R0789 Other chest pain: Secondary | ICD-10-CM | POA: Diagnosis not present

## 2021-01-18 DIAGNOSIS — I10 Essential (primary) hypertension: Secondary | ICD-10-CM | POA: Diagnosis not present

## 2021-01-18 DIAGNOSIS — R0683 Snoring: Secondary | ICD-10-CM | POA: Diagnosis not present

## 2021-02-01 ENCOUNTER — Encounter: Payer: Self-pay | Admitting: Internal Medicine

## 2021-02-01 ENCOUNTER — Ambulatory Visit (INDEPENDENT_AMBULATORY_CARE_PROVIDER_SITE_OTHER): Payer: Medicare Other | Admitting: Internal Medicine

## 2021-02-01 VITALS — BP 128/78 | HR 65 | Ht 60.0 in | Wt 159.1 lb

## 2021-02-01 DIAGNOSIS — R109 Unspecified abdominal pain: Secondary | ICD-10-CM

## 2021-02-01 DIAGNOSIS — R140 Abdominal distension (gaseous): Secondary | ICD-10-CM | POA: Diagnosis not present

## 2021-02-01 DIAGNOSIS — K219 Gastro-esophageal reflux disease without esophagitis: Secondary | ICD-10-CM

## 2021-02-01 NOTE — Progress Notes (Signed)
HISTORY OF PRESENT ILLNESS:  Susan Keith is a 75 y.o. female from the [place] who has a history of chronic dyspepsia with epigastric fullness, GERD, bloating, truncal obesity, health related anxiety, and a history of adenomatous colon polyps.  She presents today with a professional interpreter with a chief complaint of chronic nausea, postprandial heaviness, and increasing abdominal girth with weight gain.  She states that these are similar symptoms to which she has had previously.  She requests upper endoscopy or other studies to rule out ominous processes.  She does take omeprazole 40 mg daily for reflux.  For the most part this controls symptoms of classic reflux.  She has been under a great deal of stress.  Her bowel habits are regular.  She reports 10 pound weight gain over the past year.  She finds it difficult to breathe when bending over due to her abdominal girth.  No rectal bleeding.  Previous evaluations have included complete colonoscopy November 24, 2018.  She was found to have multiple diminutive polyps and melanosis coli.  Follow-up in 5 years recommended.  Upper endoscopy at that time revealed a stable paraesophageal hernia.  Otherwise normal.  Gastric emptying scan 2019 was normal.  Abdominal ultrasound 2020 revealed possible gallbladder wall thickening.  Hepatobiliary scan was normal.  Upper GI series in 2020 was stable compared to 2019 no obstructive component to hernia.  REVIEW OF SYSTEMS:  All non-GI ROS negative unless otherwise stated in the HPI except for fatigue, shortness of breath, urinary leakage, excessive urination, excessive thirst  Past Medical History:  Diagnosis Date   Allergic rhinitis    Arthritis    "right hip" (01/07/2017)   Diabetes mellitus without complication ([place]) 0000000   Fibroids    "they've decreased in size" (01/07/2017)   GERD (gastroesophageal reflux disease)    High cholesterol    Hypertension    Osteoporosis    Pre-diabetes    Thyroid disease     on synthroid   Vitamin D deficiency     Past Surgical History:  Procedure Laterality Date   APPENDECTOMY     COLONOSCOPY  2017   with endoscopy   FRACTURE SURGERY     UPPER GASTROINTESTINAL ENDOSCOPY  2019   WRIST FRACTURE SURGERY Right 05/2015    Social History Amberleigh Dusenbery  reports that she has been smoking cigarettes. She has a 12.00 pack-year smoking history. She has never used smokeless tobacco. She reports that she does not drink alcohol and does not use drugs.  family history is not on file.  No Known Allergies     PHYSICAL EXAMINATION: Vital signs: BP 128/78   Pulse 65   Ht 5' (1.524 m)   Wt 159 lb 2 oz (72.2 kg)   BMI 31.08 kg/m   Constitutional: generally well-appearing, no acute distress Psychiatric: alert and oriented x3, cooperative Eyes: extraocular movements intact, anicteric, conjunctiva pink Mouth: oral pharynx moist, no lesions Neck: supple no lymphadenopathy Cardiovascular: heart regular rate and rhythm, no murmur Lungs: clear to auscultation bilaterally Abdomen: soft, obese, nontender, nondistended, no obvious ascites, no peritoneal signs, normal bowel sounds, no organomegaly Rectal: Omitted Extremities: no clubbing, cyanosis, or lower extremity edema bilaterally Skin: no lesions on visible extremities Neuro: No focal deficits.  Cranial nerves intact  ASSESSMENT:  1.  Chronic dyspeptic symptoms with postprandial bloating discomfort. 2.  GERD.  On PPI 3.  Nonobstructing paraesophageal hernia 4.  Adenomatous colon polyps.  Last colonoscopy 2020   PLAN:  1.  Reflux precautions 2.  Continue PPI 3.  Weight loss 4.  Upper endoscopy to evaluate GI complaints and reevaluate hernia.The nature of the procedure, as well as the risks, benefits, and alternatives were carefully and thoroughly reviewed with the patient. Ample time for discussion and questions allowed. The patient understood, was satisfied, and agreed to proceed.  5.  Abdominal  ultrasound to evaluate abdominal complaints rule out interval change. 6.  Repeat surveillance colonoscopy 2025  A total time of 30 minutes was spent preparing to see the patient, reviewing x-rays, test, endoscopy reports, and pathology.  Obtaining comprehensive history via the interpreter, performing comprehensive physical examination, counseling the patient and the interpreter regarding her above listed issues.  Also, ordering advanced endoscopic and radiologic procedures.  Finally, documenting clinical information in the health record

## 2021-02-01 NOTE — Patient Instructions (Addendum)
If you are age 73 or older, your body mass index should be between 23-30. Your Body mass index is 31.08 kg/m. If this is out of the aforementioned range listed, please consider follow up with your Primary Care Provider.   PROCEDURES: You have been scheduled for a EGD. Please follow the written instructions given to you at your visit today. If you use inhalers (even only as needed), please bring them with you on the day of your procedure.  IMAGING: You will be contacted by [facility] (Your caller ID will indicate phone # [phone]) in the next 2 days to schedule your Abdominal ultrasound. If you have not heard from them within 2 business days, please call [facility] at [phone] to follow up on the status of your appointment.     It was great seeing you today! Thank you for entrusting me with your care and choosing [facility].  Dr. Henrene Pastor

## 2021-02-16 ENCOUNTER — Other Ambulatory Visit: Payer: Self-pay

## 2021-02-16 ENCOUNTER — Ambulatory Visit (HOSPITAL_COMMUNITY)
Admission: RE | Admit: 2021-02-16 | Discharge: 2021-02-16 | Disposition: A | Discharge status: Home / Self Care | Payer: Medicare Other | Source: Ambulatory Visit | Attending: Internal Medicine | Admitting: Internal Medicine

## 2021-02-16 ENCOUNTER — Encounter (HOSPITAL_COMMUNITY): Payer: Self-pay

## 2021-02-16 DIAGNOSIS — K219 Gastro-esophageal reflux disease without esophagitis: Secondary | ICD-10-CM

## 2021-02-16 DIAGNOSIS — R140 Abdominal distension (gaseous): Secondary | ICD-10-CM

## 2021-02-16 DIAGNOSIS — R109 Unspecified abdominal pain: Secondary | ICD-10-CM

## 2021-02-21 ENCOUNTER — Ambulatory Visit (HOSPITAL_COMMUNITY)
Admission: RE | Admit: 2021-02-21 | Discharge: 2021-02-21 | Disposition: A | Discharge status: Home / Self Care | Payer: Medicare Other | Source: Ambulatory Visit | Attending: Internal Medicine | Admitting: Internal Medicine

## 2021-02-21 ENCOUNTER — Other Ambulatory Visit: Payer: Self-pay

## 2021-02-21 DIAGNOSIS — R109 Unspecified abdominal pain: Secondary | ICD-10-CM | POA: Diagnosis not present

## 2021-02-21 DIAGNOSIS — K219 Gastro-esophageal reflux disease without esophagitis: Secondary | ICD-10-CM | POA: Insufficient documentation

## 2021-02-21 DIAGNOSIS — R140 Abdominal distension (gaseous): Secondary | ICD-10-CM | POA: Insufficient documentation

## 2021-03-08 DIAGNOSIS — E785 Hyperlipidemia, unspecified: Secondary | ICD-10-CM | POA: Diagnosis not present

## 2021-03-08 DIAGNOSIS — I10 Essential (primary) hypertension: Secondary | ICD-10-CM | POA: Diagnosis not present

## 2021-03-08 DIAGNOSIS — E1165 Type 2 diabetes mellitus with hyperglycemia: Secondary | ICD-10-CM | POA: Diagnosis not present

## 2021-03-08 DIAGNOSIS — K219 Gastro-esophageal reflux disease without esophagitis: Secondary | ICD-10-CM | POA: Diagnosis not present

## 2021-03-08 DIAGNOSIS — E119 Type 2 diabetes mellitus without complications: Secondary | ICD-10-CM | POA: Diagnosis not present

## 2021-03-08 DIAGNOSIS — E559 Vitamin D deficiency, unspecified: Secondary | ICD-10-CM | POA: Diagnosis not present

## 2021-03-09 ENCOUNTER — Encounter: Payer: Self-pay | Admitting: Internal Medicine

## 2021-03-09 ENCOUNTER — Ambulatory Visit (AMBULATORY_SURGERY_CENTER): Payer: Medicare Other | Admitting: Internal Medicine

## 2021-03-09 VITALS — BP 98/53 | HR 66 | Temp 97.8°F | Resp 21 | Ht 60.0 in | Wt 159.0 lb

## 2021-03-09 DIAGNOSIS — K219 Gastro-esophageal reflux disease without esophagitis: Secondary | ICD-10-CM

## 2021-03-09 DIAGNOSIS — R140 Abdominal distension (gaseous): Secondary | ICD-10-CM

## 2021-03-09 DIAGNOSIS — R1013 Epigastric pain: Secondary | ICD-10-CM

## 2021-03-09 DIAGNOSIS — K449 Diaphragmatic hernia without obstruction or gangrene: Secondary | ICD-10-CM | POA: Diagnosis not present

## 2021-03-09 DIAGNOSIS — R109 Unspecified abdominal pain: Secondary | ICD-10-CM

## 2021-03-09 MED ORDER — SODIUM CHLORIDE 0.9 % IV SOLN: 500.0000 mL | Freq: Once | INTRAVENOUS | Status: DC

## 2021-03-09 MED ORDER — PANTOPRAZOLE SODIUM 40 MG PO TBEC: 40.0000 mg | DELAYED_RELEASE_TABLET | Freq: Two times a day (BID) | ORAL | 11 refills | Status: DC

## 2021-03-09 NOTE — Op Note (Signed)
[facility] Patient Name: Susan Keith Procedure Date: 03/09/2021 10:31 AM MRN: [id number] Endoscopist: Wilhemina Bonito. Marina Goodell , MD Age: 75 Referring MD:  Date of Birth: 08/15/1945 Gender: Female Account #: [id number] Procedure:                Upper GI endoscopy Indications:              Dyspepsia, Esophageal reflux Medicines:                Monitored Anesthesia Care Procedure:                Pre-Anesthesia Assessment:                           - Prior to the procedure, a History and Physical                            was performed, and patient medications and                            allergies were reviewed. The patient's tolerance of                            previous anesthesia was also reviewed. The risks                            and benefits of the procedure and the sedation                            options and risks were discussed with the patient.                            All questions were answered, and informed consent                            was obtained. Prior Anticoagulants: The patient has                            taken no previous anticoagulant or antiplatelet                            agents. ASA Grade Assessment: II - A patient with                            mild systemic disease. After reviewing the risks                            and benefits, the patient was deemed in                            satisfactory condition to undergo the procedure.                           After obtaining informed consent, the endoscope was  passed under direct vision. Throughout the                            procedure, the patient's blood pressure, pulse, and                            oxygen saturations were monitored continuously. The                            GIF [id number] #[id number] was introduced through the                            mouth, and advanced to the second part of duodenum.                            The upper GI endoscopy  was accomplished without                            difficulty. The patient tolerated the procedure                            well. Scope In: Scope Out: Findings:                 The esophagus was normal.                           The stomach revealed a nonobstructing                            paraesophageal hernia as previous. No other                            significant abnormalities.                           The examined duodenum was normal.                           The cardia and gastric fundus were normal on                            retroflexion, save hernia. Complications:            No immediate complications. Estimated Blood Loss:     Estimated blood loss: none. Impression:               - Normal esophagus.                           - Normal stomach save nonobstructing paraesophageal                            hernia.                           - Normal examined duodenum.                           -  No specimens collected. Recommendation:           - Patient has a contact number available for                            emergencies. The signs and symptoms of potential                            delayed complications were discussed with the                            patient. Return to normal activities tomorrow.                            Written discharge instructions were provided to the                            patient.                           - Resume previous diet.                           - Continue present medications.                           - Routine office follow-up 1 year Uriel Horkey N. Marina Goodell, MD 03/09/2021 10:49:58 AM This report has been signed electronically.

## 2021-03-09 NOTE — Progress Notes (Signed)
HISTORY OF PRESENT ILLNESS:  Susan Keith is a 75 y.o. female who was evaluated in the office by myself February 01, 2021 regarding chronic dyspeptic symptoms with postprandial bloating.  She also has a history of GERD and nonobstructing paraesophageal hernia.  Due to ongoing abdominal complaints she is now for upper endoscopy.  No interval changes since her last evaluation.  She did undergo abdominal ultrasound which was normal  REVIEW OF SYSTEMS:  All non-GI ROS negative.  Past Medical History:  Diagnosis Date   Allergic rhinitis    Arthritis    "right hip" (01/07/2017)   Diabetes mellitus without complication ([place]) 1610   Fibroids    "they've decreased in size" (01/07/2017)   GERD (gastroesophageal reflux disease)    High cholesterol    Hypertension    Osteoporosis    Pre-diabetes    Thyroid disease    on synthroid   Vitamin D deficiency     Past Surgical History:  Procedure Laterality Date   APPENDECTOMY     COLONOSCOPY  2017   with endoscopy   FRACTURE SURGERY     UPPER GASTROINTESTINAL ENDOSCOPY  2019   WRIST FRACTURE SURGERY Right 05/2015    Social History Susan Keith  reports that she has been smoking cigarettes. She has a 12.00 pack-year smoking history. She has never used smokeless tobacco. She reports that she does not drink alcohol and does not use drugs.  family history is not on file.  No Known Allergies     PHYSICAL EXAMINATION:  Vital signs: BP 126/65   Pulse 73   Temp 97.8 F (36.6 C) (Skin)   Ht 5' (1.524 m)   Wt 159 lb (72.1 kg)   SpO2 95%   BMI 31.05 kg/m  General: Well-developed, well-nourished, no acute distress HEENT: Sclerae are anicteric, conjunctiva pink. Oral mucosa intact Lungs: Clear Heart: Regular Abdomen: soft, nontender, nondistended, no obvious ascites, no peritoneal signs, normal bowel sounds. No organomegaly. Extremities: No edema Psychiatric: alert and oriented x3. Cooperative     ASSESSMENT:  1.  Dyspepsia  with abdominal discomfort and GERD.  Ongoing despite PPI   PLAN:  1.  Diagnostic upper endoscopy

## 2021-03-09 NOTE — Patient Instructions (Signed)
Resume previous diet and continue current medications. Routine office follow up in 1 year.  YOU HAD AN ENDOSCOPIC PROCEDURE TODAY AT [facility]:   Refer to the procedure report that was given to you for any specific questions about what was found during the examination.  If the procedure report does not answer your questions, please call your gastroenterologist to clarify.  If you requested that your care partner not be given the details of your procedure findings, then the procedure report has been included in a sealed envelope for you to review at your convenience later.  YOU SHOULD EXPECT: Some feelings of bloating in the abdomen. Passage of more gas than usual.  Walking can help get rid of the air that was put into your GI tract during the procedure and reduce the bloating. If you had a lower endoscopy (such as a colonoscopy or flexible sigmoidoscopy) you may notice spotting of blood in your stool or on the toilet paper. If you underwent a bowel prep for your procedure, you may not have a normal bowel movement for a few days.  Please Note:  You might notice some irritation and congestion in your nose or some drainage.  This is from the oxygen used during your procedure.  There is no need for concern and it should clear up in a day or so.  SYMPTOMS TO REPORT IMMEDIATELY:  Following upper endoscopy (EGD)  Vomiting of blood or coffee ground material  New chest pain or pain under the shoulder blades  Painful or persistently difficult swallowing  New shortness of breath  Fever of 100F or higher  Black, tarry-looking stools  For urgent or emergent issues, a gastroenterologist can be reached at any hour by calling [phone]. Do not use MyChart messaging for urgent concerns.    DIET:  We do recommend a small meal at first, but then you may proceed to your regular diet.  Drink plenty of fluids but you should avoid alcoholic beverages for 24 hours.  ACTIVITY:  You should  plan to take it easy for the rest of today and you should NOT DRIVE or use heavy machinery until tomorrow (because of the sedation medicines used during the test).    FOLLOW UP: Our staff will call the number listed on your records 48-72 hours following your procedure to check on you and address any questions or concerns that you may have regarding the information given to you following your procedure. If we do not reach you, we will leave a message.  We will attempt to reach you two times.  During this call, we will ask if you have developed any symptoms of COVID 19. If you develop any symptoms (ie: fever, flu-like symptoms, shortness of breath, cough etc.) before then, please call [phone].  If you test positive for Covid 19 in the 2 weeks post procedure, please call and report this information to [place].    If any biopsies were taken you will be contacted by phone or by letter within the next 1-3 weeks.  Please call us at [phone] if you have not heard about the biopsies in 3 weeks.    SIGNATURES/CONFIDENTIALITY: You and/or your care partner have signed paperwork which will be entered into your electronic medical record.  These signatures attest to the fact that that the information above on your After Visit Summary has been reviewed and is understood.  Full responsibility of the confidentiality of this discharge information lies with you and/or your care-partner.

## 2021-03-09 NOTE — Progress Notes (Signed)
Sedate, gd SR, tolerated procedure well, VSS, report to RN 

## 2021-03-09 NOTE — Progress Notes (Signed)
VS by CW  Pt's states no medical or surgical changes since previsit or office visit.  Admission completed with help of interpreter: Rosalyn Charters

## 2021-03-13 ENCOUNTER — Telehealth: Payer: Self-pay | Admitting: *Deleted

## 2021-03-13 ENCOUNTER — Telehealth: Payer: Self-pay

## 2021-03-13 NOTE — Telephone Encounter (Signed)
First attempt, left VM.  

## 2021-03-13 NOTE — Telephone Encounter (Signed)
  Follow up Call-  Call back number 03/09/2021 11/24/2018  Post procedure Call Back phone  # [phone]- daughters number call daughter Elray Mcgregor (pt speaks russian) [phone]  Permission to leave phone message Yes Yes  Some recent data might be hidden     Patient questions:  Do you have a fever, pain , or abdominal swelling? No. Pain Score  0 *  Have you tolerated food without any problems? Yes.    Have you been able to return to your normal activities? Yes.    Do you have any questions about your discharge instructions: Diet   No. Medications  No. Follow up visit  No.  Do you have questions or concerns about your Care? No.  Actions: * If pain score is 4 or above: No action needed, pain <4. Spoke with daughter

## 2021-05-10 DIAGNOSIS — Z20822 Contact with and (suspected) exposure to covid-19: Secondary | ICD-10-CM | POA: Diagnosis not present

## 2021-06-19 ENCOUNTER — Telehealth: Payer: Self-pay | Admitting: Internal Medicine

## 2021-06-19 NOTE — Telephone Encounter (Signed)
Patient's daughter called and questioned if patient should take both the pantoprazole and omeprazole or just take the last medication prescribed.  Please call patient and advise.  Thank you.

## 2021-06-19 NOTE — Telephone Encounter (Signed)
Spoke with pts daughter and she reports she is still having the same symptoms and that she needs something to be done. Pt scheduled to see Amy Esterwood PA 06/21/21 at 10am.

## 2021-06-19 NOTE — Telephone Encounter (Signed)
Spoke with patient's daughter and clarified that patient should be taking twice a day Pantoprazole.  Patient's daughter is still concerned about her mother - she is still having nausea and discomfort every day and she wants to know if Dr. Henrene Pastor has a solution to these problems.

## 2021-06-21 ENCOUNTER — Ambulatory Visit (INDEPENDENT_AMBULATORY_CARE_PROVIDER_SITE_OTHER): Payer: Medicare Other | Admitting: Physician Assistant

## 2021-06-21 ENCOUNTER — Other Ambulatory Visit (INDEPENDENT_AMBULATORY_CARE_PROVIDER_SITE_OTHER): Payer: Medicare Other

## 2021-06-21 ENCOUNTER — Encounter: Payer: Self-pay | Admitting: Physician Assistant

## 2021-06-21 VITALS — BP 108/70 | HR 72 | Ht 58.5 in | Wt 157.1 lb

## 2021-06-21 DIAGNOSIS — R140 Abdominal distension (gaseous): Secondary | ICD-10-CM

## 2021-06-21 DIAGNOSIS — R110 Nausea: Secondary | ICD-10-CM

## 2021-06-21 DIAGNOSIS — R109 Unspecified abdominal pain: Secondary | ICD-10-CM | POA: Diagnosis not present

## 2021-06-21 DIAGNOSIS — R1310 Dysphagia, unspecified: Secondary | ICD-10-CM

## 2021-06-21 LAB — BASIC METABOLIC PANEL
BUN: 34 mg/dL — ABNORMAL HIGH (ref 6–23)
CO2: 27 mEq/L (ref 19–32)
Calcium: 10.2 mg/dL (ref 8.4–10.5)
Chloride: 105 mEq/L (ref 96–112)
Creatinine, Ser: 1.27 mg/dL — ABNORMAL HIGH (ref 0.40–1.20)
GFR: 42.02 mL/min — ABNORMAL LOW (ref >60.00)
Glucose, Bld: 125 mg/dL — ABNORMAL HIGH (ref 70–99)
Potassium: 5.1 mEq/L (ref 3.5–5.1)
Sodium: 140 mEq/L (ref 135–145)

## 2021-06-21 LAB — CBC WITH DIFFERENTIAL/PLATELET
Basophils Absolute: 0 10*3/uL (ref 0.0–0.1)
Basophils Relative: 0.5 % (ref 0.0–3.0)
Eosinophils Absolute: 0.1 10*3/uL (ref 0.0–0.7)
Eosinophils Relative: 1.9 % (ref 0.0–5.0)
HCT: 43.9 % (ref 36.0–46.0)
Hemoglobin: 14.2 g/dL (ref 12.0–15.0)
Lymphocytes Relative: 28.4 % (ref 12.0–46.0)
Lymphs Abs: 2.1 10*3/uL (ref 0.7–4.0)
MCHC: 32.3 g/dL (ref 30.0–36.0)
MCV: 87.4 fl (ref 78.0–100.0)
Monocytes Absolute: 0.6 10*3/uL (ref 0.1–1.0)
Monocytes Relative: 8.1 % (ref 3.0–12.0)
Neutro Abs: 4.5 10*3/uL (ref 1.4–7.7)
Neutrophils Relative %: 61.1 % (ref 43.0–77.0)
Platelets: 248 10*3/uL (ref 150.0–400.0)
RBC: 5.02 Mil/uL (ref 3.87–5.11)
RDW: 14.3 % (ref 11.5–15.5)
WBC: 7.3 10*3/uL (ref 4.0–10.5)

## 2021-06-21 MED ORDER — ONDANSETRON HCL 4 MG PO TABS: 4.0000 mg | ORAL_TABLET | Freq: Two times a day (BID) | ORAL | 4 refills | Status: DC | PRN

## 2021-06-21 MED ORDER — DICYCLOMINE HCL 10 MG PO CAPS: 10.0000 mg | ORAL_CAPSULE | Freq: Three times a day (TID) | ORAL | 4 refills | Status: DC

## 2021-06-21 NOTE — Patient Instructions (Signed)
If you are age 74 or older, your body mass index should be between 23-30. Your Body mass index is 32.28 kg/m. If this is out of the aforementioned range listed, please consider follow up with your Primary Care Provider. ________________________________________________________  The [facility] providers would like to encourage you to use [facility] to communicate with providers for non-urgent requests or questions.  Due to long hold times on the telephone, sending your provider a message by [facility] may be a faster and more efficient way to get a response.  Please allow 48 business hours for a response.  Please remember that this is for non-urgent requests.   Your provider has requested that you go to the basement level for lab work before leaving today. Press "B" on the elevator. The lab is located at the first door on the left as you exit the elevator.  You have been scheduled for a CT scan of the abdomen and pelvis at [facility], 1st floor Radiology. You are scheduled on 06/27/2021  at 4:30 pm. You should arrive 15 minutes prior to your appointment time for registration.  Please pick up 2 bottles of contrast from [place] at least 3 days prior to your scan. The solution may taste better if refrigerated, but do NOT add ice or any other liquid to this solution. Shake well before drinking.   Please follow the written instructions below on the day of your exam:   1) Do not eat anything after 12:30 pm (4 hours prior to your test)   2) Drink 1 bottle of contrast @ 2:30 pm (2 hours prior to your exam)  Remember to shake well before drinking and do NOT pour over ice.     Drink 1 bottle of contrast @ 3:30 pm (1 hour prior to your exam)   You may take any medications as prescribed with a small amount of water, if necessary. If you take any of the following medications: METFORMIN, GLUCOPHAGE, GLUCOVANCE, AVANDAMET, RIOMET, FORTAMET, Steger MET, JANUMET, GLUMETZA or METAGLIP, you MAY be asked to HOLD  this medication 48 hours AFTER the exam.   The purpose of you drinking the oral contrast is to aid in the visualization of your intestinal tract. The contrast solution may cause some diarrhea. Depending on your individual set of symptoms, you may also receive an intravenous injection of x-ray contrast/dye. Plan on being at [facility] for 45 minutes or longer, depending on the type of exam you are having performed.   If you have any questions regarding your exam or if you need to reschedule, you may call [facility] at [phone] between the hours of 8:00 am and 5:00 pm, Monday-Friday.  __________________________________________________________  Susan Keith have been scheduled for a Barium Esophogram at [facility] (1st floor of the hospital) on 06/29/2021 at 9:30 am. Please arrive 30 minutes prior to your appointment for registration. Make certain not to have anything to eat or drink 3 hours prior to your test. If you need to reschedule for any reason, please contact radiology at [phone] to do so. ____________________________________________________________ A barium swallow is an examination that concentrates on views of the esophagus. This tends to be a double contrast exam (barium and two liquids which, when combined, create a gas to distend the wall of the oesophagus) or single contrast (non-ionic iodine based). The study is usually tailored to your symptoms so a good history is essential. Attention is paid during the study to the form, structure and configuration of  the esophagus, looking for functional disorders (such as aspiration, dysphagia, achalasia, motility and reflux) EXAMINATION You may be asked to change into a gown, depending on the type of swallow being performed. A radiologist and radiographer will perform the procedure. The radiologist will advise you of the type of contrast selected for your procedure and direct you during the exam. You will be asked to stand, sit or  lie in several different positions and to hold a small amount of fluid in your mouth before being asked to swallow while the imaging is performed .In some instances you may be asked to swallow barium coated marshmallows to assess the motility of a solid food bolus. The exam can be recorded as a digital or video fluoroscopy procedure. POST PROCEDURE It will take 1-2 days for the barium to pass through your system. To facilitate this, it is important, unless otherwise directed, to increase your fluids for the next 24-48hrs and to resume your normal diet.  This test typically takes about 30 minutes to perform. ____________________________________________________________  Continue Pantoprazole 40 mg twice daily  START Ondansetron 4 mg 1 tablet twice daily as needed for nausea.  START Dicyclomine 10 mg 1 capsule 30 minutes before meals.  We are referring you to [facility] for evaluation of you Hernia. They are located at [address] [place], [place] [address] Their phone number is [phone]   Follow up pending the results of your testing.  Thank you for entrusting me with your care and choosing [facility].  Amy Esterwood, PA-C

## 2021-06-21 NOTE — Progress Notes (Signed)
Subjective:    Patient ID: Susan Keith, female    DOB: Nov 15, 1945, 76 y.o.   MRN: [id number]  HPI Susan Keith is a 76 year old [place] female, established with Dr. Marina Goodell.  She was last seen in the office in September 2022, with chronic dyspeptic symptoms and postprandial bloating as well as chronic GERD.  She underwent EGD 03/05/2021 which confirmed previously known nonobstructing paraesophageal hernia and was otherwise negative. She had colonoscopy in June 2020 with removal of 5 small polyps all 2 to 4 mm in size and path showed 3 tubular adenomas and 2 hyperplastic polyps, she was indicated for 5-year interval follow-up. She comes back in today with persistent symptoms.  Her daughter says she has no heartburn but complains of pain after eating most meals which she describes as a "colicky" pain present in her mid abdomen and radiating around bilaterally sometimes into her back.  She is also having nausea on a fairly constant basis all day long which has been present for many months.  Patient has a ongoing sensation of feeling as if food is sitting in her esophagus and "not digesting "" She gets uncomfortable with this, feels full all the time and bloated.  She is not vomiting.  No diarrhea or change in bowel habits. Patient and her daughter say the abdominal pain and colicky crampy pain is new over the past 6 months. They are very interested in being referred to surgery for potential repair of her hernia and also are asking for further evaluation of her other GI complaints.  Patient's daughter is asking about testing her micro biome and about potential parasites. Patient had gastric emptying scan in 2019 which was negative Ultrasound September 2022 normal and HIDA scan 09/2018 unremarkable.  Review of Systems Pertinent positive and negative review of systems were noted in the above HPI section.  All other review of systems was otherwise negative.   Outpatient Encounter Medications as of 06/21/2021   Medication Sig   amLODipine (NORVASC) 5 MG tablet TAKE 1 TABLET(5 MG) BY MOUTH DAILY   atorvastatin (LIPITOR) 20 MG tablet Take 1 tablet (20 mg total) by mouth daily.   carvedilol (COREG) 3.125 MG tablet Take 3.125 mg by mouth 2 (two) times daily.   Cholecalciferol (VITAMIN D3) 5000 units CAPS Take by mouth.   dicyclomine (BENTYL) 10 MG capsule Take 1 capsule (10 mg total) by mouth 3 (three) times daily before meals.   fluticasone (FLONASE) 50 MCG/ACT nasal spray Place 2 sprays into both nostrils daily.   JANUVIA 100 MG tablet Take 100 mg by mouth daily.   levothyroxine (SYNTHROID) 75 MCG tablet Take 75 mcg by mouth daily before breakfast.   lisinopril (PRINIVIL,ZESTRIL) 20 MG tablet Take 1 tablet (20 mg total) by mouth daily.   ondansetron (ZOFRAN) 4 MG tablet Take 1 tablet (4 mg total) by mouth 2 (two) times daily as needed for nausea or vomiting.   pantoprazole (PROTONIX) 40 MG tablet Take 1 tablet (40 mg total) by mouth 2 (two) times daily.   spironolactone (ALDACTONE) 25 MG tablet Take 25 mg by mouth daily.   [DISCONTINUED] gabapentin (NEURONTIN) 100 MG capsule gabapentin 100 mg capsule   [DISCONTINUED] metFORMIN (GLUCOPHAGE-XR) 750 MG 24 hr tablet TAKE 1 TABLET BY MOUTH EVERY DAY WITH BREAKFAST (Patient not taking: Reported on 03/09/2021)   [DISCONTINUED] nitrofurantoin, macrocrystal-monohydrate, (MACROBID) 100 MG capsule nitrofurantoin monohydrate/macrocrystals 100 mg capsule (Patient not taking: Reported on 03/09/2021)   [DISCONTINUED] omeprazole (PRILOSEC) 40 MG capsule Take 1 capsule (40 mg total)  by mouth 2 (two) times a day.   No facility-administered encounter medications on file as of 06/21/2021.   No Known Allergies Patient Active Problem List   Diagnosis Date Noted   Left hip pain 03/24/2018   Prediabetes 05/02/2017   Thyroid disorder screen 05/02/2017   Abdominal bloating 02/04/2017   Cyst of left ovary 02/04/2017   Urge incontinence of urine 02/04/2017   Hyponatremia  01/07/2017   Constipation    Essential hypertension    Nausea without vomiting 12/29/2016   Right hip pain 09/23/2016   Back pain 09/09/2016   Social History   Socioeconomic History   Marital status: Married    Spouse name: Not on file   Number of children: 2   Years of education: Not on file   Highest education level: Not on file  Occupational History   Not on file  Tobacco Use   Smoking status: Every Day    Packs/day: 0.25    Years: 48.00    Pack years: 12.00    Types: Cigarettes   Smokeless tobacco: Never  Vaping Use   Vaping Use: Never used  Substance and Sexual Activity   Alcohol use: No   Drug use: Never   Sexual activity: Not on file  Other Topics Concern   Not on file  Social History Narrative   Not on file   Social Determinants of Health   Financial Resource Strain: Not on file  Food Insecurity: Not on file  Transportation Needs: Not on file  Physical Activity: Not on file  Stress: Not on file  Social Connections: Not on file  Intimate Partner Violence: Not on file    Susan Keith's family history is not on file.      Objective:    Vitals:   06/21/21 0951  BP: 108/70  Pulse: 72    Physical Exam Well-developed well-nourished  elderly WF  in no acute distress.  Patient is accompanied by her daughter who interprets for patient,  Weight, 157 BMI 32.2  HEENT; nontraumatic normocephalic, EOMI, PE R LA, sclera anicteric. Oropharynx; not examined today Neck; supple, no JVD Cardiovascular; regular rate and rhythm with S1-S2, no murmur rub or gallop Pulmonary; Clear bilaterally Abdomen; soft, no focal tenderness, nondistended, no palpable mass or hepatosplenomegaly, bowel sounds are active, low midline incisional scar Rectal; not done today Skin; benign exam, no jaundice rash or appreciable lesions Extremities; no clubbing cyanosis or edema skin warm and dry Neuro/Psych; alert and oriented x4, grossly nonfocal mood and affect appropriate         Assessment & Plan:   #39 76 year old [place] female with known paraesophageal hernia, nonobstructing by recent EGD who comes in with persistent complaints of chronic nausea, chronic dyspepsia, fullness bloating and sensation of food sitting in the esophagus.  I suspect majority of the upper GI symptoms are secondary to the paraesophageal hernia  #2 nausea chronic-etiology not certain-has had fairly extensive work-up in the past unrevealing as above  #3 new 75-month history of intermittent mid abdominal pain radiating around and into the back Etiology not clear-rule out neoplasm or other intra-abdominal inflammatory process #4 status post appendectomy #5 history of adenomatous colon polyps-will be due for follow-up colonoscopy 2025  Plan; we will schedule for barium swallow Scheduled for CT of the abdomen pelvis with contrast CBC and c-Met today Continue twice daily pantoprazole 40 mg Start trial of Zofran 4 mg every morning, may take up to 3 times daily as needed Start trial of Bentyl 10 mg  1/2-hour AC lunch and AC dinner Will place referral to [facility] for consideration of paraesophageal hernia repair   Susan Keith S Koray Soter PA-C 06/21/2021   Cc: Erick Alley, DO

## 2021-06-21 NOTE — Progress Notes (Signed)
Assessment and plans noted ?

## 2021-06-27 ENCOUNTER — Ambulatory Visit (HOSPITAL_COMMUNITY)
Admission: RE | Admit: 2021-06-27 | Discharge: 2021-06-27 | Disposition: A | Discharge status: Home / Self Care | Payer: Medicare Other | Source: Ambulatory Visit | Attending: Physician Assistant | Admitting: Physician Assistant

## 2021-06-27 ENCOUNTER — Other Ambulatory Visit: Payer: Self-pay

## 2021-06-27 DIAGNOSIS — I700 Atherosclerosis of aorta: Secondary | ICD-10-CM | POA: Diagnosis not present

## 2021-06-27 DIAGNOSIS — R109 Unspecified abdominal pain: Secondary | ICD-10-CM | POA: Insufficient documentation

## 2021-06-27 DIAGNOSIS — R140 Abdominal distension (gaseous): Secondary | ICD-10-CM | POA: Diagnosis not present

## 2021-06-27 DIAGNOSIS — R110 Nausea: Secondary | ICD-10-CM | POA: Insufficient documentation

## 2021-06-27 DIAGNOSIS — R1310 Dysphagia, unspecified: Secondary | ICD-10-CM | POA: Insufficient documentation

## 2021-06-27 MED ORDER — IOHEXOL 300 MG/ML  SOLN
80.0000 mL | Freq: Once | INTRAMUSCULAR | Status: AC | PRN
  Administered 2021-06-27: 80 mL via INTRAVENOUS

## 2021-06-29 ENCOUNTER — Other Ambulatory Visit: Payer: Self-pay

## 2021-06-29 ENCOUNTER — Ambulatory Visit (HOSPITAL_COMMUNITY)
Admission: RE | Admit: 2021-06-29 | Discharge: 2021-06-29 | Disposition: A | Discharge status: Home / Self Care | Payer: Medicare Other | Source: Ambulatory Visit | Attending: Physician Assistant | Admitting: Physician Assistant

## 2021-06-29 DIAGNOSIS — R1310 Dysphagia, unspecified: Secondary | ICD-10-CM | POA: Diagnosis not present

## 2021-06-29 DIAGNOSIS — R110 Nausea: Secondary | ICD-10-CM

## 2021-06-29 DIAGNOSIS — R140 Abdominal distension (gaseous): Secondary | ICD-10-CM

## 2021-06-29 DIAGNOSIS — K219 Gastro-esophageal reflux disease without esophagitis: Secondary | ICD-10-CM | POA: Diagnosis not present

## 2021-06-29 DIAGNOSIS — R109 Unspecified abdominal pain: Secondary | ICD-10-CM | POA: Diagnosis not present

## 2021-06-29 DIAGNOSIS — K449 Diaphragmatic hernia without obstruction or gangrene: Secondary | ICD-10-CM | POA: Diagnosis not present

## 2021-07-10 ENCOUNTER — Telehealth: Payer: Self-pay | Admitting: Physician Assistant

## 2021-07-10 NOTE — Telephone Encounter (Signed)
Inbound call from patient's daughter requesting to speak with a nurse please in regards to a referral we submitted for patient.

## 2021-07-13 NOTE — Telephone Encounter (Signed)
[facility] to check status of the referral. Unidentified person answering the phone states the patient is not "in the system." Referral faxed again. Confirmation of successful fax received.

## 2021-07-13 NOTE — Telephone Encounter (Signed)
Spoke with daughter and with [facility].  Appointment offered for 07/27/21. Daughter cannot come with the patient on the date offered. She will call Lattie Haw and ask for a different day.

## 2021-07-13 NOTE — Telephone Encounter (Signed)
Lattie Haw from [facility] called.  She has made several attempts to call patient to schedule and the phone number is not in service.  Checked all our documentation and it is just the one number for all contacts.  She wanted to let you know she has tried, and will keep trying, but so far, has been unsuccessful.

## 2021-07-24 DIAGNOSIS — M810 Age-related osteoporosis without current pathological fracture: Secondary | ICD-10-CM | POA: Diagnosis not present

## 2021-07-24 DIAGNOSIS — E1165 Type 2 diabetes mellitus with hyperglycemia: Secondary | ICD-10-CM | POA: Diagnosis not present

## 2021-07-24 DIAGNOSIS — E039 Hypothyroidism, unspecified: Secondary | ICD-10-CM | POA: Diagnosis not present

## 2021-07-24 DIAGNOSIS — Z0001 Encounter for general adult medical examination with abnormal findings: Secondary | ICD-10-CM | POA: Diagnosis not present

## 2021-07-24 DIAGNOSIS — E119 Type 2 diabetes mellitus without complications: Secondary | ICD-10-CM | POA: Diagnosis not present

## 2021-07-24 DIAGNOSIS — G4733 Obstructive sleep apnea (adult) (pediatric): Secondary | ICD-10-CM | POA: Diagnosis not present

## 2021-07-24 DIAGNOSIS — E785 Hyperlipidemia, unspecified: Secondary | ICD-10-CM | POA: Diagnosis not present

## 2021-07-24 DIAGNOSIS — I10 Essential (primary) hypertension: Secondary | ICD-10-CM | POA: Diagnosis not present

## 2021-07-24 DIAGNOSIS — E559 Vitamin D deficiency, unspecified: Secondary | ICD-10-CM | POA: Diagnosis not present

## 2021-07-26 DIAGNOSIS — I10 Essential (primary) hypertension: Secondary | ICD-10-CM | POA: Diagnosis not present

## 2021-07-26 DIAGNOSIS — R0609 Other forms of dyspnea: Secondary | ICD-10-CM | POA: Diagnosis not present

## 2021-08-02 ENCOUNTER — Ambulatory Visit: Payer: Self-pay | Admitting: General Surgery

## 2021-08-02 DIAGNOSIS — E669 Obesity, unspecified: Secondary | ICD-10-CM | POA: Diagnosis not present

## 2021-08-02 DIAGNOSIS — K449 Diaphragmatic hernia without obstruction or gangrene: Secondary | ICD-10-CM | POA: Diagnosis not present

## 2021-08-02 DIAGNOSIS — E1169 Type 2 diabetes mellitus with other specified complication: Secondary | ICD-10-CM | POA: Diagnosis not present

## 2021-08-02 DIAGNOSIS — I10 Essential (primary) hypertension: Secondary | ICD-10-CM | POA: Diagnosis not present

## 2021-08-02 NOTE — H&P (Signed)
?Chief Complaint: New Consultation (Hiatal Hernia ) ?  ?  ?  ?History of Present Illness: ?Susan Keith is a 76 y.o. female who is seen today as an office consultation at the request of Dr. Gastroenterology for evaluation of New Consultation (Hiatal Hernia ) ?Susan Keith   ?Patient is a 76 year old [place] female comes in with her daughter secondary to dysphagia. ?Patient has previously been seen by Dr. Henrene Pastor. ?  ?In speaking with the patient's daughter as an interpreter, patient's had a long history of dysphagia and some reflux symptoms.  She currently states that she has significant dysphagia with most solids.  This brings on significant chest pain in the epigastrium.  She states that she has no reflux type symptoms nor emesis of any undigested food. ?  ?Patient undergone previous endoscopy which I reviewed personally.  There is no abnormality seen.  Patient also undergone esophagram, CT scan.  I did review these.  The CT scan does reveal a 3.5 cm wide hiatal hernia. ?  ?Patient's had a previous ex lap in [place] secondary to ruptured appendicitis. ?  ?  ?  ?  ?  ?Review of Systems: ?A complete review of systems was obtained from the patient.  I have reviewed this information and discussed as appropriate with the patient.  See HPI as well for other ROS. ?  ?Review of Systems  ?Constitutional: Negative for fever.  ?HENT: Negative for congestion.   ?Eyes: Negative for blurred vision.  ?Respiratory: Negative for cough, shortness of breath and wheezing.   ?Cardiovascular: Negative for chest pain and palpitations.  ?Gastrointestinal: Negative for heartburn.  ?Genitourinary: Negative for dysuria.  ?Musculoskeletal: Negative for myalgias.  ?Skin: Negative for rash.  ?Neurological: Negative for dizziness and headaches.  ?Psychiatric/Behavioral: Negative for depression and suicidal ideas.  ?All other systems reviewed and are negative. ?  ?  ?  ?Medical History: ?Past Medical History ?Past Medical  History: ?Diagnosis Date ? Arthritis   ? Diabetes mellitus without complication (CMS-HCC)   ? GERD (gastroesophageal reflux disease)   ? Hyperlipidemia   ? Hypertension   ? Thyroid disease   ? ?  ?  ?There is no problem list on file for this patient. ?  ?  ?Past Surgical History ?History reviewed. No pertinent surgical history. ?  ?  ?Allergies ?Allergies ?Allergen Reactions ? Latex Unknown ?    Added based on information entered during case entry, please review and add reactions, type, and severity as needed ? ?  ?  ?Current Outpatient Medications on File Prior to Visit ?Medication Sig Dispense Refill ? amLODIPine (NORVASC) 5 MG tablet amlodipine 5 mg tablet     ? atorvastatin (LIPITOR) 20 MG tablet atorvastatin 20 mg tablet     ? carvediloL (COREG) 3.125 MG tablet Take by mouth     ? pantoprazole (PROTONIX) 40 MG DR tablet pantoprazole 40 mg tablet,delayed release     ? SITagliptin phosphate (JANUVIA) 100 MG tablet Januvia 100 mg tablet     ? spironolactone (ALDACTONE) 25 MG tablet Take 1 tablet by mouth once daily     ? dicyclomine (BENTYL) 10 mg capsule       ? levothyroxine (SYNTHROID) 75 MCG tablet Take by mouth     ? lisinopriL (ZESTRIL) 20 MG tablet lisinopril 20 mg tablet     ? ondansetron (ZOFRAN) 4 MG tablet       ?  ?No current facility-administered medications on file prior to visit. ?  ?  ?Family History ?History reviewed. No  pertinent family history. ?  ?  ?Social History ?  ?Tobacco Use ?Smoking Status Never ?Smokeless Tobacco Never ?  ?  ?Social History ?Social History ?  ? ?Socioeconomic History ? Marital status: Married ?Tobacco Use ? Smoking status: Never ? Smokeless tobacco: Never ?Vaping Use ? Vaping Use: Unknown ?Substance and Sexual Activity ? Alcohol use: Not Currently ? Drug use: Never ? ?  ?  ?Objective: ?  ?  ?Vitals: ?  08/02/21 1442 ?BP: (!) 140/70 ?Pulse: 81 ?Temp: 36.4 ?C (97.6 ?F) ?SpO2: 96% ?Weight: 70.9 kg (156 lb 6.4 oz) ?Height: 149.9 cm ('4\' 11"'$ ) ?  ?Body mass index is 31.59  kg/m?. ?  ?Physical Exam ?Constitutional:   ?   Appearance: Normal appearance.  ?HENT:  ?   Head: Normocephalic and atraumatic.  ?   Mouth/Throat:  ?   Mouth: Mucous membranes are moist.  ?   Pharynx: Oropharynx is clear.  ?Eyes:  ?   General: No scleral icterus. ?   Pupils: Pupils are equal, round, and reactive to light.  ?Cardiovascular:  ?   Rate and Rhythm: Normal rate and regular rhythm.  ?   Pulses: Normal pulses.  ?   Heart sounds: No murmur heard. ?  No friction rub. No gallop.  ?Pulmonary:  ?   Effort: Pulmonary effort is normal. No respiratory distress.  ?   Breath sounds: Normal breath sounds. No stridor.  ?Abdominal:  ?   General: Abdomen is flat.  ?Musculoskeletal:     ?   General: No swelling.  ?Skin: ?   General: Skin is warm.  ?Neurological:  ?   General: No focal deficit present.  ?   Mental Status: She is alert and oriented to person, place, and time. Mental status is at baseline.  ?Psychiatric:     ?   Mood and Affect: Mood normal.     ?   Thought Content: Thought content normal.     ?   Judgment: Judgment normal.  ?  ?  ?  ?  ?  ?Assessment and Plan: ?Diagnoses and all orders for this visit: ?  ?Hiatal hernia ?  ?Primary hypertension ?  ?Diabetes mellitus type 2 in obese (CMS-HCC) ?  ?  ?Susan Keith is a 76 y.o. female  ?  ?1.  We will proceed to the OR for a robotic hiatal hernia repair with mesh and fundoplication ?2. All risks and benefits were discussed with the patient, to generally include infection, bleeding, damage to surrounding structures, possible pneumothorax, and recurrence. Alternatives were offered and described.  All questions were answered and the patient voiced understanding of the procedure and wishes to proceed at this point. ? ?

## 2021-08-02 NOTE — H&P (View-Only) (Signed)
?Chief Complaint: New Consultation (Hiatal Hernia ) ?  ?  ?  ?History of Present Illness: ?Susan Keith is a 76 y.o. female who is seen today as an office consultation at the request of Dr. Gastroenterology for evaluation of New Consultation (Hiatal Hernia ) ?Susan Keith   ?Patient is a 76 year old [place] female comes in with her daughter secondary to dysphagia. ?Patient has previously been seen by Dr. Henrene Pastor. ?  ?In speaking with the patient's daughter as an interpreter, patient's had a long history of dysphagia and some reflux symptoms.  She currently states that she has significant dysphagia with most solids.  This brings on significant chest pain in the epigastrium.  She states that she has no reflux type symptoms nor emesis of any undigested food. ?  ?Patient undergone previous endoscopy which I reviewed personally.  There is no abnormality seen.  Patient also undergone esophagram, CT scan.  I did review these.  The CT scan does reveal a 3.5 cm wide hiatal hernia. ?  ?Patient's had a previous ex lap in [place] secondary to ruptured appendicitis. ?  ?  ?  ?  ?  ?Review of Systems: ?A complete review of systems was obtained from the patient.  I have reviewed this information and discussed as appropriate with the patient.  See HPI as well for other ROS. ?  ?Review of Systems  ?Constitutional: Negative for fever.  ?HENT: Negative for congestion.   ?Eyes: Negative for blurred vision.  ?Respiratory: Negative for cough, shortness of breath and wheezing.   ?Cardiovascular: Negative for chest pain and palpitations.  ?Gastrointestinal: Negative for heartburn.  ?Genitourinary: Negative for dysuria.  ?Musculoskeletal: Negative for myalgias.  ?Skin: Negative for rash.  ?Neurological: Negative for dizziness and headaches.  ?Psychiatric/Behavioral: Negative for depression and suicidal ideas.  ?All other systems reviewed and are negative. ?  ?  ?  ?Medical History: ?Past Medical History ?Past Medical  History: ?Diagnosis Date ? Arthritis   ? Diabetes mellitus without complication (CMS-HCC)   ? GERD (gastroesophageal reflux disease)   ? Hyperlipidemia   ? Hypertension   ? Thyroid disease   ? ?  ?  ?There is no problem list on file for this patient. ?  ?  ?Past Surgical History ?History reviewed. No pertinent surgical history. ?  ?  ?Allergies ?Allergies ?Allergen Reactions ? Latex Unknown ?    Added based on information entered during case entry, please review and add reactions, type, and severity as needed ? ?  ?  ?Current Outpatient Medications on File Prior to Visit ?Medication Sig Dispense Refill ? amLODIPine (NORVASC) 5 MG tablet amlodipine 5 mg tablet     ? atorvastatin (LIPITOR) 20 MG tablet atorvastatin 20 mg tablet     ? carvediloL (COREG) 3.125 MG tablet Take by mouth     ? pantoprazole (PROTONIX) 40 MG DR tablet pantoprazole 40 mg tablet,delayed release     ? SITagliptin phosphate (JANUVIA) 100 MG tablet Januvia 100 mg tablet     ? spironolactone (ALDACTONE) 25 MG tablet Take 1 tablet by mouth once daily     ? dicyclomine (BENTYL) 10 mg capsule       ? levothyroxine (SYNTHROID) 75 MCG tablet Take by mouth     ? lisinopriL (ZESTRIL) 20 MG tablet lisinopril 20 mg tablet     ? ondansetron (ZOFRAN) 4 MG tablet       ?  ?No current facility-administered medications on file prior to visit. ?  ?  ?Family History ?History reviewed. No  pertinent family history. ?  ?  ?Social History ?  ?Tobacco Use ?Smoking Status Never ?Smokeless Tobacco Never ?  ?  ?Social History ?Social History ?  ? ?Socioeconomic History ? Marital status: Married ?Tobacco Use ? Smoking status: Never ? Smokeless tobacco: Never ?Vaping Use ? Vaping Use: Unknown ?Substance and Sexual Activity ? Alcohol use: Not Currently ? Drug use: Never ? ?  ?  ?Objective: ?  ?  ?Vitals: ?  08/02/21 1442 ?BP: (!) 140/70 ?Pulse: 81 ?Temp: 36.4 ?C (97.6 ?F) ?SpO2: 96% ?Weight: 70.9 kg (156 lb 6.4 oz) ?Height: 149.9 cm ('4\' 11"'$ ) ?  ?Body mass index is 31.59  kg/m?. ?  ?Physical Exam ?Constitutional:   ?   Appearance: Normal appearance.  ?HENT:  ?   Head: Normocephalic and atraumatic.  ?   Mouth/Throat:  ?   Mouth: Mucous membranes are moist.  ?   Pharynx: Oropharynx is clear.  ?Eyes:  ?   General: No scleral icterus. ?   Pupils: Pupils are equal, round, and reactive to light.  ?Cardiovascular:  ?   Rate and Rhythm: Normal rate and regular rhythm.  ?   Pulses: Normal pulses.  ?   Heart sounds: No murmur heard. ?  No friction rub. No gallop.  ?Pulmonary:  ?   Effort: Pulmonary effort is normal. No respiratory distress.  ?   Breath sounds: Normal breath sounds. No stridor.  ?Abdominal:  ?   General: Abdomen is flat.  ?Musculoskeletal:     ?   General: No swelling.  ?Skin: ?   General: Skin is warm.  ?Neurological:  ?   General: No focal deficit present.  ?   Mental Status: She is alert and oriented to person, place, and time. Mental status is at baseline.  ?Psychiatric:     ?   Mood and Affect: Mood normal.     ?   Thought Content: Thought content normal.     ?   Judgment: Judgment normal.  ?  ?  ?  ?  ?  ?Assessment and Plan: ?Diagnoses and all orders for this visit: ?  ?Hiatal hernia ?  ?Primary hypertension ?  ?Diabetes mellitus type 2 in obese (CMS-HCC) ?  ?  ?Susan Keith is a 76 y.o. female  ?  ?1.  We will proceed to the OR for a robotic hiatal hernia repair with mesh and fundoplication ?2. All risks and benefits were discussed with the patient, to generally include infection, bleeding, damage to surrounding structures, possible pneumothorax, and recurrence. Alternatives were offered and described.  All questions were answered and the patient voiced understanding of the procedure and wishes to proceed at this point. ? ?

## 2021-08-09 NOTE — Progress Notes (Addendum)
Surgical Instructions ? ? ? Your procedure is scheduled on 08/14/21. ? Report to [facility] Main Entrance "A" at 7:30 A.M., then check in with the Admitting office. ? Call this number if you have problems the morning of surgery: ? [phone] ? ? If you have any questions prior to your surgery date call [phone]: Open Monday-Friday 8am-4pm ? ? ? Remember: ? Do not eat after midnight the night before your surgery ? ?You may drink clear liquids until 6:30 the morning of your surgery.   ?Clear liquids allowed are: Water, Non-Citrus Juices (without pulp), Carbonated Beverages, Clear Tea, Black Coffee ONLY (NO MILK, CREAM OR POWDERED CREAMER of any kind), and Gatorade ? ?Please complete your PRE-SURGERY Gatorade that was provided to you by 6:30 the morning of surgery.  Please, if able, drink it in one setting. DO NOT SIP.  ?  ? Take these medicines the morning of surgery with A SIP OF WATER:  ?amLODipine (NORVASC) ?atorvastatin (LIPITOR) ?carvedilol (COREG)  ?dicyclomine (BENTYL) ?fluticasone (FLONASE)  ?levothyroxine (SYNTHROID)  ?pantoprazole (PROTONIX)  ?ondansetron [facility]) - as needed ? ? ?As of today, STOP taking any Aspirin (unless otherwise instructed by your surgeon) Aleve, Naproxen, Ibuprofen, Motrin, Advil, Goody's, BC's, all herbal medications, fish oil, and all vitamins. ? ?WHAT DO I DO ABOUT MY DIABETES MEDICATION? ? ? ?Do not take oral diabetes medicines (pills) the morning of surgery.  DO NOT take JANUVIA the morning of surgery. ? ? ?HOW TO MANAGE YOUR DIABETES ?BEFORE AND AFTER SURGERY ? ?Why is it important to control my blood sugar before and after surgery? ?Improving blood sugar levels before and after surgery helps healing and can limit problems. ?A way of improving blood sugar control is eating a healthy diet by: ? Eating less sugar and carbohydrates ? Increasing activity/exercise ? Talking with your doctor about reaching your blood sugar goals ?High blood sugars (greater than 180 mg/dL) can  raise your risk of infections and slow your recovery, so you will need to focus on controlling your diabetes during the weeks before surgery. ?Make sure that the doctor who takes care of your diabetes knows about your planned surgery including the date and location. ? ?How do I manage my blood sugar before surgery? ?Check your blood sugar at least 4 times a day, starting 2 days before surgery, to make sure that the level is not too high or low. ? ?Check your blood sugar the morning of your surgery when you wake up and every 2 hours until you get to the Short Stay unit. ? ?If your blood sugar is less than 70 mg/dL, you will need to treat for low blood sugar: ?Do not take insulin. ?Treat a low blood sugar (less than 70 mg/dL) with ? cup of clear juice (cranberry or apple), 4 glucose tablets, OR glucose gel. ?Recheck blood sugar in 15 minutes after treatment (to make sure it is greater than 70 mg/dL). If your blood sugar is not greater than 70 mg/dL on recheck, call [phone] for further instructions. ?Report your blood sugar to the short stay nurse when you get to Short Stay. ? ?If you are admitted to the hospital after surgery: ?Your blood sugar will be checked by the staff and you will probably be given insulin after surgery (instead of oral diabetes medicines) to make sure you have good blood sugar levels. ?The goal for blood sugar control after surgery is 80-180 mg/dL.  ? ?         ?Do not wear jewelry  or makeup ?Do not wear lotions, powders, perfumes or deodorant. ?Do not shave 48 hours prior to surgery.   ?Do not bring valuables to the hospital. ?Do not wear nail polish, gel polish, artificial nails, or any other type of covering on natural nails (fingers and toes) ?If you have artificial nails or gel coating that need to be removed by a nail salon, please have this removed prior to surgery. Artificial nails or gel coating may interfere with anesthesia's ability to adequately monitor your vital signs. ? ?[facility] is not responsible for any belongings or valuables. .  ? ?Do NOT Smoke (Tobacco/Vaping)  24 hours prior to your procedure ? ?If you use a CPAP at night, you may bring your mask for your overnight stay. ?  ?Contacts, glasses, hearing aids, dentures or partials may not be worn into surgery, please bring cases for these belongings ?  ?For patients admitted to the hospital, discharge time will be determined by your treatment team. ?  ?Patients discharged the day of surgery will not be allowed to drive home, and someone needs to stay with them for 24 hours. ? ?NO VISITORS WILL BE ALLOWED IN PRE-OP WHERE PATIENTS ARE PREPPED FOR SURGERY.  ONLY 1 SUPPORT PERSON MAY BE PRESENT IN THE WAITING ROOM WHILE YOU ARE IN SURGERY.  IF YOU ARE TO BE ADMITTED, ONCE YOU ARE IN YOUR ROOM YOU WILL BE ALLOWED TWO (2) VISITORS. 1 (ONE) VISITOR MAY STAY OVERNIGHT BUT MUST ARRIVE TO THE ROOM BY 8pm.  Minor children may have two parents present. Special consideration for safety and communication needs will be reviewed on a case by case basis. ? ?Special instructions:   ? ?Oral Hygiene is also important to reduce your risk of infection.  Remember - BRUSH YOUR TEETH THE MORNING OF SURGERY WITH YOUR REGULAR TOOTHPASTE ? ? ?[place]- Preparing For Surgery ? ?Before surgery, you can play an important role. Because skin is not sterile, your skin needs to be as free of germs as possible. You can reduce the number of germs on your skin by washing with CHG (chlorahexidine gluconate) Soap before surgery.  CHG is an antiseptic cleaner which kills germs and bonds with the skin to continue killing germs even after washing.   ? ? ?Please do not use if you have an allergy to CHG or antibacterial soaps. If your skin becomes reddened/irritated stop using the CHG.  ?Do not shave (including legs and underarms) for at least 48 hours prior to first CHG shower. It is OK to shave your face. ? ?Please follow these instructions carefully. ?  ? ? Shower the  NIGHT BEFORE SURGERY and the MORNING OF SURGERY with CHG Soap.  ? If you chose to wash your hair, wash your hair first as usual with your normal shampoo. After you shampoo, rinse your hair and body thoroughly to remove the shampoo.  Then [employer] and genitals (private parts) with your normal soap and rinse thoroughly to remove soap. ? ?After that Use CHG Soap as you would any other liquid soap. You can apply CHG directly to the skin and wash gently with a scrungie or a clean washcloth.  ? ?Apply the CHG Soap to your body ONLY FROM THE NECK DOWN.  Do not use on open wounds or open sores. Avoid contact with your eyes, ears, mouth and genitals (private parts). Wash Face and genitals (private parts)  with your normal soap.  ? ?Wash thoroughly, paying special attention to the area where  your surgery will be performed. ? ?Thoroughly rinse your body with warm water from the neck down. ? ?DO NOT shower/wash with your normal soap after using and rinsing off the CHG Soap. ? ?Pat yourself dry with a CLEAN TOWEL. ? ?Wear CLEAN PAJAMAS to bed the night before surgery ? ?Place CLEAN SHEETS on your bed the night before your surgery ? ?DO NOT SLEEP WITH PETS. ? ? ?Day of Surgery: ? ?Take a shower with CHG soap. ?Wear Clean/Comfortable clothing the morning of surgery ?Do not apply any deodorants/lotions.   ?Remember to brush your teeth WITH YOUR REGULAR TOOTHPASTE. ? ? ?  ?Please read over the following fact sheets that you were given.   ?

## 2021-08-10 ENCOUNTER — Encounter (HOSPITAL_COMMUNITY): Payer: Self-pay

## 2021-08-10 ENCOUNTER — Encounter (HOSPITAL_COMMUNITY)
Admission: RE | Admit: 2021-08-10 | Discharge: 2021-08-10 | Disposition: A | Discharge status: Home / Self Care | Payer: Medicare Other | Source: Ambulatory Visit | Attending: General Surgery | Admitting: General Surgery

## 2021-08-10 ENCOUNTER — Other Ambulatory Visit: Payer: Self-pay

## 2021-08-10 VITALS — BP 129/76 | HR 81 | Temp 98.1°F | Resp 17 | Ht 61.0 in | Wt 153.3 lb

## 2021-08-10 DIAGNOSIS — I10 Essential (primary) hypertension: Secondary | ICD-10-CM | POA: Insufficient documentation

## 2021-08-10 DIAGNOSIS — K449 Diaphragmatic hernia without obstruction or gangrene: Secondary | ICD-10-CM | POA: Insufficient documentation

## 2021-08-10 DIAGNOSIS — K219 Gastro-esophageal reflux disease without esophagitis: Secondary | ICD-10-CM | POA: Insufficient documentation

## 2021-08-10 DIAGNOSIS — E785 Hyperlipidemia, unspecified: Secondary | ICD-10-CM | POA: Diagnosis not present

## 2021-08-10 DIAGNOSIS — E119 Type 2 diabetes mellitus without complications: Secondary | ICD-10-CM | POA: Insufficient documentation

## 2021-08-10 DIAGNOSIS — I447 Left bundle-branch block, unspecified: Secondary | ICD-10-CM | POA: Diagnosis not present

## 2021-08-10 DIAGNOSIS — Z01818 Encounter for other preprocedural examination: Secondary | ICD-10-CM | POA: Insufficient documentation

## 2021-08-10 DIAGNOSIS — I272 Pulmonary hypertension, unspecified: Secondary | ICD-10-CM | POA: Diagnosis not present

## 2021-08-10 DIAGNOSIS — E039 Hypothyroidism, unspecified: Secondary | ICD-10-CM | POA: Diagnosis not present

## 2021-08-10 HISTORY — DX: Diaphragmatic hernia without obstruction or gangrene: K44.9

## 2021-08-10 HISTORY — DX: Left bundle-branch block, unspecified: I44.7

## 2021-08-10 LAB — BASIC METABOLIC PANEL
Anion gap: 9 (ref 5–15)
BUN: 26 mg/dL — ABNORMAL HIGH (ref 8–23)
CO2: 25 mmol/L (ref 22–32)
Calcium: 9.4 mg/dL (ref 8.9–10.3)
Chloride: 105 mmol/L (ref 98–111)
Creatinine, Ser: 1.29 mg/dL — ABNORMAL HIGH (ref 0.44–1.00)
GFR, Estimated: 44 mL/min — ABNORMAL LOW (ref >60)
Glucose, Bld: 144 mg/dL — ABNORMAL HIGH (ref 70–99)
Potassium: 5.1 mmol/L (ref 3.5–5.1)
Sodium: 139 mmol/L (ref 135–145)

## 2021-08-10 LAB — CBC
HCT: 45.7 % (ref 36.0–46.0)
Hemoglobin: 14.4 g/dL (ref 12.0–15.0)
MCH: 29 pg (ref 26.0–34.0)
MCHC: 31.5 g/dL (ref 30.0–36.0)
MCV: 92 fL (ref 80.0–100.0)
Platelets: 291 10*3/uL (ref 150–400)
RBC: 4.97 MIL/uL (ref 3.87–5.11)
RDW: 14.6 % (ref 11.5–15.5)
WBC: 7 10*3/uL (ref 4.0–10.5)
nRBC: 0 % (ref 0.0–0.2)

## 2021-08-10 LAB — HEMOGLOBIN A1C
Hgb A1c MFr Bld: 6.6 % — ABNORMAL HIGH (ref 4.8–5.6)
Mean Plasma Glucose: 142.72 mg/dL

## 2021-08-10 LAB — GLUCOSE, CAPILLARY: Glucose-Capillary: 137 mg/dL — ABNORMAL HIGH (ref 70–99)

## 2021-08-10 NOTE — Progress Notes (Addendum)
The pt's daughter Kipp Laurence called to request that the interpreter Alla not be used on the day of surgery. The reason was not disclosed.Kipp Laurence stated that she can translate for her mother instead. Explained to [place] that we have to use a medical interpreter to ensure that the patients understand everything that is being said. An email was sent to the interpreting department with this request. ?

## 2021-08-10 NOTE — Progress Notes (Addendum)
PCP: Precious Gilding, MD ?Cardiologist: Abran Richard, MD.  Sees for hypertension only ? ?EKG: 08/10/21 ?CXR: na ?ECHO: 7/22 ?Stress Test: 01/16/21 ?Cardiac Cath: denies ? ?Fasting Blood Sugar- 135-140 ?Checks Blood Sugar__1_ times a day ? ?ASA/Blood Thinner: No ? ?OSA/CPAP: No ? ?Covid test not needed ? ?Anesthesia Review: Yes, Jeneen Rinks, PA requested copy of EKG 10/17/20 ? ?Patient denies shortness of breath, fever, cough, and chest pain at PAT appointment. ? ?Patient verbalized understanding of instructions provided today at the PAT appointment.  Patient asked to review instructions at home and day of surgery.   ?

## 2021-08-13 ENCOUNTER — Encounter (HOSPITAL_COMMUNITY): Payer: Self-pay

## 2021-08-13 NOTE — Progress Notes (Signed)
Anesthesia Chart Review: ? Case: [id number] Date/Time: 08/14/21 0915  ? Procedure: XI ROBOTIC ASSISTED HIATAL HERNIA REPAIR WITH FUNDOPLICATION  ? Anesthesia type: General  ? Pre-op diagnosis: hiatal hernia  ? Location: [facility] ROOM 10 / [facility] OR  ? Surgeons: Ralene Ok, MD  ? ?  ? ? ?DISCUSSION: Patient is a 76 year old female scheduled for the above procedure. ? ?History includes smoking, HTN, HLD, DM2, left BBB, GERD, hiatal hernia, hypothyroidism.  ? ?Last cardiology visit with Dr. Otho Perl on 07/26/21. He follows for HTN and DOE, established 10/17/20. She also has a LBBB since at least July 2022 and underwent a stress (01/16/21) and echo (12/18/20) that showed no significant ischemia and LVEF 55-60% (by echo), mild TR, mild pulmonary hypertension.  At last visit she was doing overall well without any significant dyspnea or chest pain.  BP was well controlled once back on medications.  Next follow-up visit is scheduled for 02/07/2022.  BP 129/76 at PAT.  ? ?Anesthesia team to evaluate on the day of surgery. ? ?Patient needs [place] interpreter. See 08/10/21 Progress Note by Bobbie Stack, RN.  ? ?VS: BP 129/76   Pulse 81   Temp 36.7 ?C (Oral)   Resp 17   Ht '5\' 1"'$  (1.549 m)   Wt 69.5 kg   SpO2 98%   BMI 28.97 kg/m?  ? ? ?PROVIDERS: ?Precious Gilding, DO is PCP  ?Abran Richard, MD is cardiologist [facility]) ? ? ?LABS: Labs reviewed: Acceptable for surgery. ?(all labs ordered are listed, but only abnormal results are displayed) ? ?Labs Reviewed  ?GLUCOSE, CAPILLARY - Abnormal; Notable for the following components:  ?    Result Value  ? Glucose-Capillary 137 (*)   ? All other components within normal limits  ?BASIC METABOLIC PANEL - Abnormal; Notable for the following components:  ? Glucose, Bld 144 (*)   ? BUN 26 (*)   ? Creatinine, Ser 1.29 (*)   ? GFR, Estimated 44 (*)   ? All other components within normal limits  ?HEMOGLOBIN A1C - Abnormal; Notable for the following components:  ? Hgb A1c MFr Bld 6.6 (*)   ? All  other components within normal limits  ?CBC  ? ? ? ?IMAGES: ?CT Abd/pelvis 06/29/21: ?IMPRESSION: ?- No acute findings. ?- Stable large hiatal hernia containing nearly the entire stomach. ?- Stable tiny umbilical hernia, which contains only fat. ?- Stable uterine fibroids. Stable mild left hydrosalpinx or small ?benign ovarian cyst. No follow-up imaging recommended. ?- Aortic Atherosclerosis (ICD10-I70.0). ? ? ?EKG: 08/10/21: ?Normal sinus rhythm with sinus arrhythmia ?Left bundle branch block ?Abnormal ECG ?No previous ECGs available ?Confirmed by Larae Grooms [phone]) on 08/10/2021 1:15:24 PM ? ? ?CV: ?Nuclear stress test 01/16/22 ([facility]): ?- The resting electrocardiogram demonstrated: Sinus arrhythmia, left bundle  ?branch block  ?IMPRESSION:  ?Fixed Apical wall defect without associated wall motion abnormality  ?compatible with tissue attenuation.    ?No significant ischemia is suggested  ?Normal left ventricular function with ejection fraction calculated at 51 %  ? ?Echo 12/18/20 ([facility]): ?SUMMARY  ?Left ventricular systolic function is normal.  ?LV ejection fraction = 55-60%.  ?Left ventricular filling pattern is indeterminate. Abnormal (paradoxical) septal motion consistent with LBBB.   ?The left atrium is moderately dilated.  ?There is mild tricuspid regurgitation.  ?Borderline Mild pulmonary hypertension. Estimated right ventricular  ?systolic pressure is 32 mmHg. Mild pulmonary hypertension.  ?There is no pericardial effusion.  ?There is no comparison study available.   ? ? ?Past Medical  History:  ?Diagnosis Date  ? Allergic rhinitis   ? Arthritis   ? "right hip" (01/07/2017)  ? Diabetes mellitus without complication ([place]) 5625  ? Fibroids   ? "they've decreased in size" (01/07/2017)  ? GERD (gastroesophageal reflux disease)   ? Hiatal hernia   ? High cholesterol   ? Hypertension   ? Left bundle branch block   ? Osteoporosis   ? Pre-diabetes   ? Thyroid disease   ? on synthroid  ? Vitamin D deficiency    ? ? ?Past Surgical History:  ?Procedure Laterality Date  ? APPENDECTOMY    ? COLONOSCOPY  2017  ? with endoscopy  ? FRACTURE SURGERY    ? UPPER GASTROINTESTINAL ENDOSCOPY  2019  ? WRIST FRACTURE SURGERY Right 05/2015  ? ? ?MEDICATIONS: ? amLODipine (NORVASC) 5 MG tablet  ? atorvastatin (LIPITOR) 20 MG tablet  ? carvedilol (COREG) 3.125 MG tablet  ? Cholecalciferol (VITAMIN D3) 5000 units CAPS  ? dicyclomine (BENTYL) 10 MG capsule  ? fluticasone (FLONASE) 50 MCG/ACT nasal spray  ? JANUVIA 100 MG tablet  ? levothyroxine (SYNTHROID) 75 MCG tablet  ? lisinopril (PRINIVIL,ZESTRIL) 20 MG tablet  ? ondansetron (ZOFRAN) 4 MG tablet  ? pantoprazole (PROTONIX) 40 MG tablet  ? spironolactone (ALDACTONE) 25 MG tablet  ? ?No current facility-administered medications for this encounter.  ? ? ?Myra Gianotti, PA-C ?Surgical Short Stay/Anesthesiology ?[facility] Phone [phone] ?[facility] Phone [phone] ?08/13/2021 10:09 AM ? ? ? ? ? ? ?

## 2021-08-13 NOTE — Anesthesia Preprocedure Evaluation (Addendum)
Anesthesia Evaluation  ?Patient identified by MRN, date of birth, ID band ?Patient awake ? ? ? ?Reviewed: ?Allergy & Precautions, NPO status , Patient's Chart, lab work & pertinent test results ? ?Airway ?Mallampati: II ? ?TM Distance: >3 FB ?Neck ROM: Full ? ? ? Dental ? ? ?Bridge:   ?Pulmonary ?neg pulmonary ROS, Current Smoker and Patient abstained from smoking.,  ?  ?Pulmonary exam normal ?breath sounds clear to auscultation ? ? ? ? ? ? Cardiovascular ?hypertension, Pt. on medications ?Normal cardiovascular exam+ dysrhythmias (LBBB)  ?Rhythm:Regular Rate:Normal ? ?Normal sinus rhythm with sinus arrhythmia ?Left bundle branch block ?Abnormal ECG ?No previous ECGs available ?Confirmed by Larae Grooms [phone]) on 08/10/2021 1:15:24 PM ?  ?Neuro/Psych ?negative neurological ROS ? negative psych ROS  ? GI/Hepatic ?negative GI ROS, Neg liver ROS, hiatal hernia, GERD  Medicated,dysphagia ?  ?Endo/Other  ?negative endocrine ROSdiabetesPrediabetes ?Hyperlipidemia ? ? Renal/GU ?negative Renal ROS  ?negative genitourinary ?  ?Musculoskeletal ?negative musculoskeletal ROS ?(+) Arthritis ,  ? Abdominal ?  ?Peds ?negative pediatric ROS ?(+)  Hematology ?negative hematology ROS ?(+)   ?Anesthesia Other Findings ? ? Reproductive/Obstetrics ?negative OB ROS ? ?  ? ? ? ? ? ? ? ? ? ? ? ? ? ?  ?  ? ? ? ? ? ? ?Anesthesia Physical ?Anesthesia Plan ? ?ASA: 3 ? ?Anesthesia Plan: General  ? ?Post-op Pain Management: Tylenol PO (pre-op)*  ? ?Induction: Intravenous and Rapid sequence ? ?PONV Risk Score and Plan: 2 and Treatment may vary due to age or medical condition, Ondansetron and Dexamethasone ? ?Airway Management Planned: Oral ETT ? ?Additional Equipment: None ? ?Intra-op Plan:  ? ?Post-operative Plan: Extubation in OR ? ?Informed Consent: I have reviewed the patients History and Physical, chart, labs and discussed the procedure including the risks, benefits and alternatives for the proposed  anesthesia with the patient or authorized representative who has indicated his/her understanding and acceptance.  ? ? ? ?Dental advisory given, Consent reviewed with POA and Interpreter used for interveiw ? ?Plan Discussed with: CRNA, Anesthesiologist and Surgeon ? ?Anesthesia Plan Comments: (Daughter refusing medical interpreter and doing the interpretation herself. Norton Blizzard, MD  ? ? ?PAT note written 08/13/2021 by Myra Gianotti, PA-C. Known LBBB with work-up Summer 2022 through Dr. Otho Perl wiht [facility]. ? ?Patient needs [place] interpreter. See 08/10/21 Progress Note by Bobbie Stack, RN.  ?)  ? ? ? ?Anesthesia Quick Evaluation ? ?

## 2021-08-14 ENCOUNTER — Ambulatory Visit (HOSPITAL_BASED_OUTPATIENT_CLINIC_OR_DEPARTMENT_OTHER): Payer: Medicare Other | Admitting: Anesthesiology

## 2021-08-14 ENCOUNTER — Encounter (HOSPITAL_COMMUNITY): Payer: Self-pay | Admitting: General Surgery

## 2021-08-14 ENCOUNTER — Inpatient Hospital Stay (HOSPITAL_COMMUNITY)
Admission: RE | Admit: 2021-08-14 | Discharge: 2021-08-16 | DRG: 328 | Disposition: A | Discharge status: Home / Self Care | Payer: Medicare Other | Attending: General Surgery | Admitting: General Surgery

## 2021-08-14 ENCOUNTER — Other Ambulatory Visit: Payer: Self-pay

## 2021-08-14 ENCOUNTER — Encounter (HOSPITAL_COMMUNITY)
Admission: RE | Disposition: A | Discharge status: Home / Self Care | Payer: Self-pay | Source: Home / Self Care | Attending: General Surgery

## 2021-08-14 ENCOUNTER — Ambulatory Visit (HOSPITAL_COMMUNITY): Payer: Medicare Other | Admitting: Physician Assistant

## 2021-08-14 ENCOUNTER — Telehealth: Payer: Self-pay

## 2021-08-14 DIAGNOSIS — I10 Essential (primary) hypertension: Secondary | ICD-10-CM | POA: Diagnosis present

## 2021-08-14 DIAGNOSIS — E669 Obesity, unspecified: Secondary | ICD-10-CM | POA: Diagnosis present

## 2021-08-14 DIAGNOSIS — R1310 Dysphagia, unspecified: Secondary | ICD-10-CM | POA: Diagnosis present

## 2021-08-14 DIAGNOSIS — M1990 Unspecified osteoarthritis, unspecified site: Secondary | ICD-10-CM | POA: Diagnosis not present

## 2021-08-14 DIAGNOSIS — I447 Left bundle-branch block, unspecified: Secondary | ICD-10-CM | POA: Diagnosis not present

## 2021-08-14 DIAGNOSIS — E119 Type 2 diabetes mellitus without complications: Secondary | ICD-10-CM | POA: Diagnosis not present

## 2021-08-14 DIAGNOSIS — Z79899 Other long term (current) drug therapy: Secondary | ICD-10-CM | POA: Diagnosis not present

## 2021-08-14 DIAGNOSIS — Z91040 Latex allergy status: Secondary | ICD-10-CM

## 2021-08-14 DIAGNOSIS — E079 Disorder of thyroid, unspecified: Secondary | ICD-10-CM | POA: Diagnosis not present

## 2021-08-14 DIAGNOSIS — Z79890 Hormone replacement therapy (postmenopausal): Secondary | ICD-10-CM | POA: Diagnosis not present

## 2021-08-14 DIAGNOSIS — Z6830 Body mass index (BMI) 30.0-30.9, adult: Secondary | ICD-10-CM

## 2021-08-14 DIAGNOSIS — K219 Gastro-esophageal reflux disease without esophagitis: Secondary | ICD-10-CM | POA: Diagnosis not present

## 2021-08-14 DIAGNOSIS — E785 Hyperlipidemia, unspecified: Secondary | ICD-10-CM

## 2021-08-14 DIAGNOSIS — K449 Diaphragmatic hernia without obstruction or gangrene: Principal | ICD-10-CM | POA: Diagnosis present

## 2021-08-14 DIAGNOSIS — Z9889 Other specified postprocedural states: Principal | ICD-10-CM

## 2021-08-14 HISTORY — PX: XI ROBOTIC ASSISTED HIATAL HERNIA REPAIR: SHX6889

## 2021-08-14 HISTORY — PX: INSERTION OF MESH: SHX5868

## 2021-08-14 LAB — GLUCOSE, CAPILLARY
Glucose-Capillary: 127 mg/dL — ABNORMAL HIGH (ref 70–99)
Glucose-Capillary: 213 mg/dL — ABNORMAL HIGH (ref 70–99)
Glucose-Capillary: 130 mg/dL — ABNORMAL HIGH (ref 70–99)

## 2021-08-14 SURGERY — REPAIR, HERNIA, HIATAL, ROBOT-ASSISTED
Anesthesia: General | Site: Abdomen

## 2021-08-14 MED ORDER — ONDANSETRON 4 MG PO TBDP: 4.0000 mg | ORAL_TABLET | Freq: Four times a day (QID) | ORAL | Status: DC | PRN

## 2021-08-14 MED ORDER — OXYCODONE HCL 5 MG PO TABS
5.0000 mg | ORAL_TABLET | Freq: Once | ORAL | Status: AC | PRN
  Administered 2021-08-14: 5 mg via ORAL

## 2021-08-14 MED ORDER — FENTANYL CITRATE (PF) 100 MCG/2ML IJ SOLN
25.0000 ug | INTRAMUSCULAR | Status: DC | PRN
  Administered 2021-08-14 (×3): 50 ug via INTRAVENOUS

## 2021-08-14 MED ORDER — ONDANSETRON HCL 4 MG/2ML IJ SOLN
INTRAMUSCULAR | Status: DC | PRN
  Administered 2021-08-14: 4 mg via INTRAVENOUS

## 2021-08-14 MED ORDER — DEXTROSE-NACL 5-0.9 % IV SOLN: INTRAVENOUS | Status: DC

## 2021-08-14 MED ORDER — FENTANYL CITRATE (PF) 100 MCG/2ML IJ SOLN
INTRAMUSCULAR | Status: AC
  Filled 2021-08-14: qty 2

## 2021-08-14 MED ORDER — ONDANSETRON HCL 4 MG/2ML IJ SOLN
4.0000 mg | Freq: Four times a day (QID) | INTRAMUSCULAR | Status: DC | PRN
  Administered 2021-08-15: 4 mg via INTRAVENOUS
  Filled 2021-08-14: qty 2

## 2021-08-14 MED ORDER — FENTANYL CITRATE (PF) 250 MCG/5ML IJ SOLN
INTRAMUSCULAR | Status: AC
  Filled 2021-08-14: qty 5

## 2021-08-14 MED ORDER — ORAL CARE MOUTH RINSE: 15.0000 mL | Freq: Once | OROMUCOSAL | Status: AC

## 2021-08-14 MED ORDER — PROPOFOL 10 MG/ML IV BOLUS
INTRAVENOUS | Status: DC | PRN
  Administered 2021-08-14 (×2): 10 mg via INTRAVENOUS
  Administered 2021-08-14: 150 mg via INTRAVENOUS
  Administered 2021-08-14 (×2): 10 mg via INTRAVENOUS

## 2021-08-14 MED ORDER — LIDOCAINE 2% (20 MG/ML) 5 ML SYRINGE
INTRAMUSCULAR | Status: DC | PRN
  Administered 2021-08-14: 80 mg via INTRAVENOUS

## 2021-08-14 MED ORDER — SUGAMMADEX SODIUM 200 MG/2ML IV SOLN
INTRAVENOUS | Status: DC | PRN
  Administered 2021-08-14: 200 mg via INTRAVENOUS

## 2021-08-14 MED ORDER — BUPIVACAINE HCL (PF) 0.25 % IJ SOLN
INTRAMUSCULAR | Status: AC
  Filled 2021-08-14: qty 30

## 2021-08-14 MED ORDER — AMISULPRIDE (ANTIEMETIC) 5 MG/2ML IV SOLN: 10.0000 mg | Freq: Once | INTRAVENOUS | Status: DC | PRN

## 2021-08-14 MED ORDER — ACETAMINOPHEN 500 MG PO TABS
1000.0000 mg | ORAL_TABLET | ORAL | Status: AC
  Administered 2021-08-14: 1000 mg via ORAL
  Filled 2021-08-14: qty 2

## 2021-08-14 MED ORDER — ENSURE PRE-SURGERY PO LIQD: 296.0000 mL | Freq: Once | ORAL | Status: DC

## 2021-08-14 MED ORDER — LACTATED RINGERS IV SOLN: INTRAVENOUS | Status: DC

## 2021-08-14 MED ORDER — BUPIVACAINE LIPOSOME 1.3 % IJ SUSP
INTRAMUSCULAR | Status: AC
  Filled 2021-08-14: qty 20

## 2021-08-14 MED ORDER — INSULIN ASPART 100 UNIT/ML IJ SOLN
0.0000 [IU] | INTRAMUSCULAR | Status: DC | PRN
  Administered 2021-08-14: 4 [IU] via SUBCUTANEOUS

## 2021-08-14 MED ORDER — FENTANYL CITRATE (PF) 250 MCG/5ML IJ SOLN
INTRAMUSCULAR | Status: DC | PRN
  Administered 2021-08-14 (×5): 50 ug via INTRAVENOUS

## 2021-08-14 MED ORDER — CEFAZOLIN SODIUM-DEXTROSE 2-4 GM/100ML-% IV SOLN
2.0000 g | INTRAVENOUS | Status: AC
  Administered 2021-08-14: 2 g via INTRAVENOUS
  Filled 2021-08-14: qty 100

## 2021-08-14 MED ORDER — ONDANSETRON HCL 4 MG/2ML IJ SOLN
4.0000 mg | Freq: Once | INTRAMUSCULAR | Status: AC | PRN
  Administered 2021-08-14: 4 mg via INTRAVENOUS

## 2021-08-14 MED ORDER — CHLORHEXIDINE GLUCONATE 0.12 % MT SOLN
15.0000 mL | Freq: Once | OROMUCOSAL | Status: AC
  Administered 2021-08-14: 15 mL via OROMUCOSAL
  Filled 2021-08-14: qty 15

## 2021-08-14 MED ORDER — OXYCODONE HCL 5 MG PO TABS
ORAL_TABLET | ORAL | Status: AC
  Filled 2021-08-14: qty 1

## 2021-08-14 MED ORDER — DEXAMETHASONE SODIUM PHOSPHATE 10 MG/ML IJ SOLN
INTRAMUSCULAR | Status: DC | PRN
  Administered 2021-08-14: 4 mg via INTRAVENOUS

## 2021-08-14 MED ORDER — OXYCODONE HCL 5 MG/5ML PO SOLN: 5.0000 mg | Freq: Once | ORAL | Status: AC | PRN

## 2021-08-14 MED ORDER — HYDROMORPHONE HCL 1 MG/ML IJ SOLN
1.0000 mg | INTRAMUSCULAR | Status: DC | PRN
  Administered 2021-08-14 – 2021-08-15 (×4): 1 mg via INTRAVENOUS
  Filled 2021-08-14 (×4): qty 1

## 2021-08-14 MED ORDER — PHENYLEPHRINE 40 MCG/ML (10ML) SYRINGE FOR IV PUSH (FOR BLOOD PRESSURE SUPPORT)
PREFILLED_SYRINGE | INTRAVENOUS | Status: DC | PRN
  Administered 2021-08-14: 200 ug via INTRAVENOUS
  Administered 2021-08-14: 40 ug via INTRAVENOUS
  Administered 2021-08-14 (×2): 80 ug via INTRAVENOUS
  Administered 2021-08-14: 40 ug via INTRAVENOUS

## 2021-08-14 MED ORDER — CHLORHEXIDINE GLUCONATE CLOTH 2 % EX PADS: 6.0000 | MEDICATED_PAD | Freq: Once | CUTANEOUS | Status: DC

## 2021-08-14 MED ORDER — ONDANSETRON HCL 4 MG/2ML IJ SOLN
INTRAMUSCULAR | Status: AC
Start: 2021-08-14 — End: 2021-08-15
  Filled 2021-08-14: qty 2

## 2021-08-14 MED ORDER — BUPIVACAINE HCL (PF) 0.25 % IJ SOLN
INTRAMUSCULAR | Status: DC | PRN
  Administered 2021-08-14: 20 mL

## 2021-08-14 MED ORDER — SUCCINYLCHOLINE CHLORIDE 200 MG/10ML IV SOSY
PREFILLED_SYRINGE | INTRAVENOUS | Status: DC | PRN
  Administered 2021-08-14: 160 mg via INTRAVENOUS

## 2021-08-14 MED ORDER — PHENYLEPHRINE HCL-NACL 20-0.9 MG/250ML-% IV SOLN
INTRAVENOUS | Status: DC | PRN
  Administered 2021-08-14: 25 ug/min via INTRAVENOUS

## 2021-08-14 MED ORDER — 0.9 % SODIUM CHLORIDE (POUR BTL) OPTIME
TOPICAL | Status: DC | PRN
  Administered 2021-08-14: 1000 mL

## 2021-08-14 MED ORDER — ROCURONIUM BROMIDE 10 MG/ML (PF) SYRINGE
PREFILLED_SYRINGE | INTRAVENOUS | Status: DC | PRN
  Administered 2021-08-14: 10 mg via INTRAVENOUS
  Administered 2021-08-14: 50 mg via INTRAVENOUS

## 2021-08-14 MED ORDER — SODIUM CHLORIDE 0.9 % IV SOLN
INTRAVENOUS | Status: DC | PRN
  Administered 2021-08-14: 40 mL

## 2021-08-14 MED ORDER — EPHEDRINE SULFATE-NACL 50-0.9 MG/10ML-% IV SOSY
PREFILLED_SYRINGE | INTRAVENOUS | Status: DC | PRN
  Administered 2021-08-14 (×2): 5 mg via INTRAVENOUS

## 2021-08-14 MED ORDER — CARVEDILOL 3.125 MG PO TABS
3.1250 mg | ORAL_TABLET | Freq: Two times a day (BID) | ORAL | Status: DC
  Administered 2021-08-14 – 2021-08-16 (×4): 3.125 mg via ORAL
  Filled 2021-08-14 (×4): qty 1

## 2021-08-14 SURGICAL SUPPLY — 64 items
APPLIER CLIP 5 13 M/L LIGAMAX5 (MISCELLANEOUS)
CANNULA REDUC XI 12-8 STAPL (CANNULA) ×1
CANNULA REDUCER 12-8 DVNC XI (CANNULA) ×1 IMPLANT
CHLORAPREP W/TINT 26 (MISCELLANEOUS) ×2 IMPLANT
CLIP APPLIE 5 13 M/L LIGAMAX5 (MISCELLANEOUS) IMPLANT
COVER MAYO STAND STRL (DRAPES) ×2 IMPLANT
COVER SURGICAL LIGHT HANDLE (MISCELLANEOUS) ×2 IMPLANT
COVER TIP SHEARS 8 DVNC (MISCELLANEOUS) IMPLANT
COVER TIP SHEARS 8MM DA VINCI (MISCELLANEOUS) ×1
DEFOGGER SCOPE WARMER CLEARIFY (MISCELLANEOUS) ×2 IMPLANT
DERMABOND ADVANCED (GAUZE/BANDAGES/DRESSINGS) ×1
DERMABOND ADVANCED .7 DNX12 (GAUZE/BANDAGES/DRESSINGS) ×1 IMPLANT
DEVICE TROCAR PUNCTURE CLOSURE (ENDOMECHANICALS) ×2 IMPLANT
DRAIN PENROSE 0.5X18 (DRAIN) IMPLANT
DRAPE ARM DVNC X/XI (DISPOSABLE) ×4 IMPLANT
DRAPE CARDIOVASC SPLIT 88X140 (DRAPES) ×2 IMPLANT
DRAPE COLUMN DVNC XI (DISPOSABLE) ×1 IMPLANT
DRAPE DA VINCI XI ARM (DISPOSABLE) ×4
DRAPE DA VINCI XI COLUMN (DISPOSABLE) ×1
DRAPE ORTHO SPLIT 77X108 STRL (DRAPES) ×1
DRAPE SURG ORHT 6 SPLT 77X108 (DRAPES) ×1 IMPLANT
ELECT REM PT RETURN 9FT ADLT (ELECTROSURGICAL) ×2
ELECTRODE REM PT RTRN 9FT ADLT (ELECTROSURGICAL) ×1 IMPLANT
GLOVE SURG SYN 7.5  E (GLOVE) ×3
GLOVE SURG SYN 7.5 E (GLOVE) ×3 IMPLANT
GLOVE SURG SYN 7.5 PF PI (GLOVE) ×3 IMPLANT
GOWN STRL REUS W/ TWL LRG LVL3 (GOWN DISPOSABLE) ×1 IMPLANT
GOWN STRL REUS W/ TWL XL LVL3 (GOWN DISPOSABLE) ×2 IMPLANT
GOWN STRL REUS W/TWL 2XL LVL3 (GOWN DISPOSABLE) ×2 IMPLANT
GOWN STRL REUS W/TWL LRG LVL3 (GOWN DISPOSABLE) ×1
GOWN STRL REUS W/TWL XL LVL3 (GOWN DISPOSABLE) ×2
IRRIGATION STRYKERFLOW (MISCELLANEOUS) IMPLANT
IRRIGATOR STRYKERFLOW (MISCELLANEOUS) ×2
KIT BASIN OR (CUSTOM PROCEDURE TRAY) ×2 IMPLANT
KIT TURNOVER KIT B (KITS) IMPLANT
MARKER SKIN DUAL TIP RULER LAB (MISCELLANEOUS) ×2 IMPLANT
MESH BIO-A 7X10 SYN MAT (Mesh General) ×2 IMPLANT
NDL INSUFFLATION 14GA 120MM (NEEDLE) ×1 IMPLANT
NEEDLE 22X1 1/2 (OR ONLY) (NEEDLE) ×2 IMPLANT
NEEDLE INSUFFLATION 14GA 120MM (NEEDLE) ×2 IMPLANT
OBTURATOR OPTICAL STANDARD 8MM (TROCAR)
OBTURATOR OPTICAL STND 8 DVNC (TROCAR)
OBTURATOR OPTICALSTD 8 DVNC (TROCAR) IMPLANT
PENCIL SMOKE EVACUATOR (MISCELLANEOUS) IMPLANT
SCISSORS LAP 5X35 DISP (ENDOMECHANICALS) IMPLANT
SEAL CANN UNIV 5-8 DVNC XI (MISCELLANEOUS) ×3 IMPLANT
SEAL XI 5MM-8MM UNIVERSAL (MISCELLANEOUS) ×3
SEALER VESSEL DA VINCI XI (MISCELLANEOUS) ×1
SEALER VESSEL EXT DVNC XI (MISCELLANEOUS) ×1 IMPLANT
SET IRRIG TUBING LAPAROSCOPIC (IRRIGATION / IRRIGATOR) ×1 IMPLANT
SET TUBE SMOKE EVAC HIGH FLOW (TUBING) ×2 IMPLANT
STAPLER CANNULA SEAL DVNC XI (STAPLE) ×1 IMPLANT
STAPLER CANNULA SEAL XI (STAPLE) ×1
STAPLER VISISTAT 35W (STAPLE) IMPLANT
STOPCOCK 4 WAY LG BORE MALE ST (IV SETS) ×2 IMPLANT
SUT ETHIBOND 0 36 GRN (SUTURE) ×4 IMPLANT
SUT ETHIBOND 2 0 SH (SUTURE)
SUT ETHIBOND 2 0 SH 36X2 (SUTURE) IMPLANT
SUT MNCRL AB 4-0 PS2 18 (SUTURE) ×2 IMPLANT
SUT SILK 0 SH 30 (SUTURE) ×3 IMPLANT
SYR 30ML SLIP (SYRINGE) ×2 IMPLANT
TRAY FOLEY MTR SLVR 16FR STAT (SET/KITS/TRAYS/PACK) ×2 IMPLANT
TRAY LAPAROSCOPIC MC (CUSTOM PROCEDURE TRAY) ×2 IMPLANT
TROCAR ADV FIXATION 5X100MM (TROCAR) ×2 IMPLANT

## 2021-08-14 NOTE — Op Note (Signed)
08/14/2021 ? ?11:06 AM ? ?PATIENT:  Susan Keith  76 y.o. female ? ?PRE-OPERATIVE DIAGNOSIS:  hiatal hernia ? ?POST-OPERATIVE DIAGNOSIS:  hiatal hernia ? ?PROCEDURE:  Procedure(s): ?XI ROBOTIC ASSISTED HIATAL HERNIA REPAIR WITH TOUPET FUNDOPLICATION (N/A) ?INSERTION OF MESH (N/A) ? ?SURGEON:  Surgeon(s) and Role: ?   Ralene Ok, MD - Primary ? ?ASSISTANTS: Pryor Curia, RNFA  ? ?ANESTHESIA:   local and general ? ?EBL:  min  ? ?BLOOD ADMINISTERED:none ? ?DRAINS: none  ? ?LOCAL MEDICATIONS USED:  BUPIVICAINE  and OTHER exparel ? ?SPECIMEN:  No Specimen ? ?DISPOSITION OF SPECIMEN:  N/A ? ?COUNTS:  YES ? ?TOURNIQUET:  * No tourniquets in log * ? ?DICTATION: .Dragon Dictation ? ?The patient was taken back to the operating room and placed in the supine position with bilateral SCDs in place. The patient was prepped and draped in the usual sterile fashion. After appropriate antibiotics were confirmed a timeout was called and all facts were verified.  ? ?A Veress needle technique was used to insufflate the abdomen to 15 mm of mercury the paramedian stab incision. Subsequent to this an 8 mm trocar was introduced as was a 8 millimeter camera. At this time the subsequent robotic trochars x3, were then placed adjacent to this trocar approximately 8-10 cm away. Each trocar was inserted under direct visualization, there were total of 4 trochars. A 34m trocar was placed in the midclavicular line.  A 0-vicryl was placed to help with closure at the end of the case.The assistant trocar was then placed in the right lower quadrant under direct visualization. The Nathanson retractor was then visualized inserted into the abdomen and the incision just to the left of the falciform ligament. This was then placed to retract the liver appropriately. At this time the patient was positioned in reverse Trendelenburg.  ? ?At this time the robot patient cart was brought to the bedside and placed in good position and the arms were  docked to the trochars appropriately. At this time I proceeded to incised the gastrohepatic ligament.  At this time I proceeded to mobilize the stomach inferiorly and visualize the right crus. The peritoneum over the right crus was incised and right crus was identified. I proceeded to dissect this inferiorly until the left crus was seen joining the right crus. Once the right crus was adequately dissected we turned our to the left crus which was dissected away. This required traction of the stomach to the right side. Once this was visualized we then proceeded to circumferentially dissect the esophagus away from the surrounding tissue. The anterior and posterior vagus was seen along the esophagus at the GE junction.  These were both preserved throughout the entire case. At this time the phrenoesophageal fat pad was dissected away from the esophagus. There was a large-sized hiatal hernia seen. I mobilized the esophagus cephalad approximately 4-5 cm, clearing away the surrounding tissue. The anterior hernia sac was dissected away from the stomach and esophagus. ? ?At this time we turned our attention to the greater curvature the stomach and the omentum was mobilized using the robotic vessel sealer. This was taken up to the greater curvature to the hiatus. This mobilized the entire greater curvature to allow mobilization and the wrap. I then proceeded to bring the greater curvature the stomach posterior to the esophagus, and a shoeshine technique was used to evaluate the mobilization of the greater curvature.  ? ?At this time I proceeded to close the hiatus using interrupted 0 Ethibonds x  3.  There was some tension anteriorly.  I made a relaxing incision on the right crura.  I also decreased in the gas pressure to 85mHg. This brought together the hiatal closure without undue stricture to the esophagus.  ? ?A piece of Gore Bio A hiatal mesh was placed over the hiatal closure and sutured to the crus using 0 Ethibonds  sutures x 4. ? ?At this time the greater curvature was brought around the esophagus and sutured using 0 silk sutures interrupted fashion approximately 1 cm apart x3 on each side of the esophagus in a Toupet fashion. A left collar stitch was then used to gastropexy the stomach from the wrap to the diaphragm just lateral to the left crus as.  A second collar stitch was placed from the wrap to the right crus.  The wrap lay loose with no strangulation of the esophagus. ? ?At this time the robot was undocked. The liver trocar was removed. At this time insufflation was evacuated. Skin was reapproximated for Monocryl subcuticular fashion. The skin was then dressed with Dermabond. The patient tolerated the procedure well and was taken to the recovery room in stable condition. ? ? ?PLAN OF CARE: Admit for overnight observation ? ?PATIENT DISPOSITION:  PACU - hemodynamically stable. ?  ?Delay start of Pharmacological VTE agent (>24hrs) due to surgical blood loss or risk of bleeding: not applicable ? ?

## 2021-08-14 NOTE — Plan of Care (Signed)
  Problem: Education: Goal: Knowledge of General Education information will improve Description Including pain rating scale, medication(s)/side effects and non-pharmacologic comfort measures Outcome: Progressing   Problem: Health Behavior/Discharge Planning: Goal: Ability to manage health-related needs will improve Outcome: Progressing   

## 2021-08-14 NOTE — Transfer of Care (Signed)
Immediate Anesthesia Transfer of Care Note ? ?Patient: Susan Keith ? ?Procedure(s) Performed: XI ROBOTIC ASSISTED HIATAL HERNIA REPAIR WITH FUNDOPLICATION (Abdomen) ?INSERTION OF MESH (Abdomen) ? ?Patient Location: PACU ? ?Anesthesia Type:General ? ?Level of Consciousness: awake and drowsy ? ?Airway & Oxygen Therapy: Patient Spontanous Breathing and Patient connected to nasal cannula oxygen ? ?Post-op Assessment: Report given to RN and Post -op Vital signs reviewed and stable ? ?Post vital signs: Reviewed and stable ? ?Last Vitals:  ?Vitals Value Taken Time  ?BP 128/61 08/14/21 1123  ?Temp    ?Pulse 67 08/14/21 1123  ?Resp 16 08/14/21 1123  ?SpO2 93 % 08/14/21 1123  ?Vitals shown include unvalidated device data. ? ?Last Pain:  ?Vitals:  ? 08/14/21 0813  ?TempSrc:   ?PainSc: 0-No pain  ?   ? ?  ? ?Complications: No notable events documented. ?

## 2021-08-14 NOTE — Discharge Instructions (Signed)
EATING AFTER YOUR ESOPHAGEAL SURGERY ?(Stomach Fundoplication, Hiatal Hernia repair, Achalasia surgery, etc) ? ?###################################################################### ? ?EAT ?Start with a pureed / full liquid diet (see below) ?Gradually transition to a high fiber diet with a fiber supplement over the next month after discharge.   ? ?WALK ?Walk an hour a day.  Control your pain to do that.   ? ?CONTROL PAIN ?Control pain so that you can walk, sleep, tolerate sneezing/coughing, go up/down stairs. ? ?HAVE A BOWEL MOVEMENT DAILY ?Keep your bowels regular to avoid problems.  OK to try a laxative to override constipation.  OK to use an antidairrheal to slow down diarrhea.  Call if not better after 2 tries ? ?CALL IF YOU HAVE PROBLEMS/CONCERNS ?Call if you are still struggling despite following these instructions. ?Call if you have concerns not answered by these instructions ? ?###################################################################### ? ? ?After your esophageal surgery, expect some sticking with swallowing over the next 1-2 months.   ? ?If food sticks when you eat, it is called "dysphagia".  This is due to swelling around your esophagus at the wrap & hiatal diaphragm repair.  It will gradually ease off over the next few months.  To help you through this temporary phase, we start you out on a pureed (blenderized) diet. ? ?Your first meal in the hospital was thin liquids.  You should have been given a pureed diet by the time you left the hospital.  We ask patients to stay on a pureed diet for the first 2-3 weeks to avoid anything getting "stuck" near your recent surgery.  Don't be alarmed if your ability to swallow doesn't progress according to this plan.  Everyone is different and some diets can advance more or less quickly.   ? ?It is often helpful to crush your medications or split them as they can sometimes stick, especially the first week or so. ? ? ?Some BASIC RULES to follow are: ?Maintain  an upright position whenever eating or drinking. ?Take small bites - just a teaspoon size bite at a time. ?Eat slowly.  It may also help to eat only one food at a time. ?Consider nibbling through smaller, more frequent meals & avoid the urge to eat BIG meals ?Do not push through feelings of fullness, nausea, or bloatedness ?Do not mix solid foods and liquids in the same mouthful ?Try not to "wash foods down" with large gulps of liquids. ?Avoid carbonated (bubbly/fizzy) drinks.   ?Avoid foods that make you feel gassy or bloated.  Start with bland foods first.  Wait on trying greasy, fried, or spicy meals until you are tolerating more bland solids well. ?Understand that it will be hard to burp and belch at first.  This gradually improves with time.  Expect to be more gassy/flatulent/bloated initially.  Walking will help your body manage it better. ?Consider using medications for bloating that contain simethicone such as  Maalox or Gas-X  ?Consider crushing her medications, especially smaller pills.  The ability to swallow pills should get easier after a few weeks ?Eat in a relaxed atmosphere & minimize distractions. ?Avoid talking while eating.   ?Do not use straws. ?Following each meal, sit in an upright position (90 degree angle) for 60 to 90 minutes.  Going for a short walk can help as well ?If food does stick, don't panic.  Try to relax and let the food pass on its own.  Sipping WARM LIQUID such as strong hot black tea can also help slide it down. ? ? ?  Be gradual in changes & use common sense: ? ?-If you easily tolerating a certain "level" of foods, advance to the next level gradually ?-If you are having trouble swallowing a particular food, then avoid it.   ?-If food is sticking when you advance your diet, go back to thinner previous diet (the lower LEVEL) for 1-2 days. ? ?LEVEL 1 = PUREED DIET ? ?Do for the first 2 WEEKS AFTER SURGERY ? ?-Foods in this group are pureed or blenderized to a smooth, mashed  potato-like consistency.  ?-If necessary, the pureed foods can keep their shape with the addition of a thickening agent.   ?-Meat should be pureed to a smooth, pasty consistency.  Hot broth or gravy may be added to the pureed meat, approximately 1 oz. of liquid per 3 oz. serving of meat. ?-CAUTION:  If any foods do not puree into a smooth consistency, swallowing will be more difficult.  (For example, nuts or seeds sometimes do not blend well.) ? ?Hot Foods Cold Foods  ?Pureed scrambled eggs and cheese Pureed cottage cheese  ?Baby cereals Thickened juices and nectars  ?Thinned cooked cereals (no lumps) Thickened milk or eggnog  ?Pureed [place] toast or pancakes Ensure  ?Mashed potatoes Ice cream  ?Pureed parsley, au gratin, scalloped potatoes, candied sweet potatoes Fruit or [place] ice, sherbet  ?Pureed buttered or alfredo noodles Plain yogurt  ?Pureed vegetables (no corn or peas) Instant breakfast  ?Pureed soups and creamed soups Smooth pudding, mousse, custard  ?Pureed scalloped apples Whipped gelatin  ?Gravies Sugar, syrup, honey, jelly  ?Sauces, cheese, tomato, barbecue, white, creamed Cream  ?Any baby food Creamer  ?Alcohol in moderation (not beer or champagne) Margarine  ?Coffee or tea Mayonnaise  ? Ketchup, mustard  ? Apple sauce  ? ?SAMPLE MENU:  PUREED DIET ?Breakfast Lunch Dinner  ?[place] juice, 1/2 cup ?Cream of wheat, 1/2 cup Pineapple juice, 1/2 cup Pureed [place], barley soup, 3/4 cup ?Pureed Hawaiian chicken, 3 oz  ?Scrambled eggs, mashed or blended with cheese, 1/2 cup ?Tea or coffee, 1 cup  ?Whole milk, 1 cup  ?Non-dairy creamer, 2 Tbsp. Mashed potatoes, 1/2 cup ?Pureed cooled broccoli, 1/2 cup ?Apple sauce, 1/2 cup ?Coffee or tea Mashed potatoes, 1/2 cup ?Pureed spinach, 1/2 cup ?Frozen yogurt, 1/2 cup ?Tea or coffee  ? ? ? ? ?LEVEL 2 = SOFT DIET ? ?After your first 2 weeks, you can advance to a soft diet.   ?Keep on this diet until everything goes down easily. ? ?Hot Foods Cold Foods  ?White fish  [place]  ?Stuffed fish Junior baby fruit  ?Baby food meals Semi thickened juices  ?Minced soft cooked, scrambled, poached eggs nectars  ?Souffle & omelets Ripe mashed bananas  ?Cooked cereals Canned fruit, pineapple sauce, milk  ?potatoes Milkshake  ?Buttered or Alfredo noodles Custard  ?Cooked cooled vegetable Puddings, including tapioca  ?Sherbet Yogurt  ?Vegetable soup or alphabet soup Fruit ice, [place] ice  ?Gravies Whipped gelatin  ?Sugar, syrup, honey, jelly Junior baby desserts  ?Sauces:  Cheese, creamed, barbecue, tomato, white Cream  ?Coffee or tea Margarine  ? ?SAMPLE MENU:  LEVEL 2 ?Breakfast Lunch Dinner  ?Orange juice, 1/2 cup ?Oatmeal, 1/2 cup ?Scrambled eggs with cheese, 1/2 cup ?Decaffeinated tea, 1 cup ?Whole milk, 1 cup ?Non-dairy creamer, 2 Tbsp Pineapple juice, 1/2 cup ?Minced beef, 3 oz ?Gravy, 2 Tbsp ?Mashed potatoes, 1/2 cup ?Minced fresh broccoli, 1/2 cup ?Applesauce, 1/2 cup ?Coffee, 1 cup [place], barley soup, 3/4 cup ?Minced Hawaiian chicken, 3 oz ?  Mashed potatoes, 1/2 cup ?Cooked spinach, 1/2 cup ?Frozen yogurt, 1/2 cup ?Non-dairy creamer, 2 Tbsp  ? ? ? ? ?LEVEL 3 = CHOPPED DIET ? ?-After all the foods in level 2 (soft diet) are passing through well you should advance up to more chopped foods.  ?-It is still important to cut these foods into small pieces and eat slowly. ? ?Hot Foods Cold Foods  ?[employer]  ?Chopped Swedish meatballs Yogurt  ?Meat salads (ground or flaked meat) Milk  ?Flaked fish (tuna) Milkshakes  ?Poached or scrambled eggs Soft, cold, dry cereal  ?Souffles and omelets Fruit juices or nectars  ?Cooked cereals Chopped canned fruit  ?Chopped [place] toast or pancakes Canned fruit cocktail  ?Noodles or pasta (no rice) Pudding, mousse, custard  ?Cooked vegetables (no frozen peas, corn, or mixed vegetables) Green salad  ?Canned small sweet peas Ice cream  ?Creamed soup or vegetable soup Fruit ice, [place] ice  ?Pureed vegetable soup or alphabet soup  Non-dairy creamer  ?Ground scalloped apples Margarine  ?Gravies Mayonnaise  ?Sauces:  Cheese, creamed, barbecue, tomato, white Ketchup  ?Coffee or tea Mustard  ? ?SAMPLE MENU:  LEVEL 3 ?Breakfast Lunch Dinner  ?Orange

## 2021-08-14 NOTE — Anesthesia Procedure Notes (Signed)
Procedure Name: Intubation ?Date/Time: 08/14/2021 9:11 AM ?Performed by: Wilburn Cornelia, CRNA ?Pre-anesthesia Checklist: Patient identified, Emergency Drugs available, Suction available, Patient being monitored and Timeout performed ?Patient Re-evaluated:Patient Re-evaluated prior to induction ?Oxygen Delivery Method: Circle system utilized ?Preoxygenation: Pre-oxygenation with 100% oxygen ?Induction Type: IV induction ?Ventilation: Mask ventilation without difficulty ?Laryngoscope Size: Mac and 3 ?Grade View: Grade III ?Tube type: Oral ?Tube size: 7.0 mm ?Number of attempts: 1 ?Airway Equipment and Method: Stylet ?Placement Confirmation: ETT inserted through vocal cords under direct vision, positive ETCO2, CO2 detector and breath sounds checked- equal and bilateral ?Secured at: 21 cm ?Tube secured with: Tape ?Dental Injury: Teeth and Oropharynx as per pre-operative assessment  ? ? ? ? ?

## 2021-08-14 NOTE — Telephone Encounter (Signed)
Pt daughter is wanting to see if Dr. Alain Marion would consider taking the family on as patients: the daughter, this pt, and her father. ? ?Please advise ?

## 2021-08-14 NOTE — Interval H&P Note (Signed)
History and Physical Interval Note: ? ?08/14/2021 ?8:44 AM ? ?Susan Keith  has presented today for surgery, with the diagnosis of hiatal hernia.  The various methods of treatment have been discussed with the patient and family. After consideration of risks, benefits and other options for treatment, the patient has consented to  Procedure(s): ?XI ROBOTIC [place] (N/A) as a surgical intervention.  The patient's history has been reviewed, patient examined, no change in status, stable for surgery.  I have reviewed the patient's chart and labs.  Questions were answered to the patient's satisfaction.   ? ? ?Ralene Ok ? ? ?

## 2021-08-14 NOTE — Anesthesia Postprocedure Evaluation (Signed)
Anesthesia Post Note ? ?Patient: Marguetta Windish ? ?Procedure(s) Performed: XI ROBOTIC ASSISTED HIATAL HERNIA REPAIR WITH FUNDOPLICATION (Abdomen) ?INSERTION OF MESH (Abdomen) ? ?  ? ?Patient location during evaluation: PACU ?Anesthesia Type: General ?Level of consciousness: awake and alert ?Pain management: pain level controlled ?Vital Signs Assessment: post-procedure vital signs reviewed and stable ?Respiratory status: spontaneous breathing, nonlabored ventilation and respiratory function stable ?Cardiovascular status: blood pressure returned to baseline and stable ?Postop Assessment: no apparent nausea or vomiting ?Anesthetic complications: no ? ? ?No notable events documented. ? ?Last Vitals:  ?Vitals:  ? 08/14/21 1255 08/14/21 1325  ?BP: 123/72 (!) 148/66  ?Pulse: 71 69  ?Resp: 13 15  ?Temp: (!) 36.4 ?C   ?SpO2: 97% 98%  ?  ?Last Pain:  ?Vitals:  ? 08/14/21 1255  ?TempSrc:   ?PainSc: 7   ? ? ?  ?  ?  ?  ?  ?  ? ?Merlinda Frederick ? ? ? ? ?

## 2021-08-15 ENCOUNTER — Encounter (HOSPITAL_COMMUNITY): Payer: Self-pay | Admitting: General Surgery

## 2021-08-15 ENCOUNTER — Observation Stay (HOSPITAL_COMMUNITY): Payer: Medicare Other

## 2021-08-15 DIAGNOSIS — Z79899 Other long term (current) drug therapy: Secondary | ICD-10-CM | POA: Diagnosis not present

## 2021-08-15 DIAGNOSIS — R1310 Dysphagia, unspecified: Secondary | ICD-10-CM | POA: Diagnosis not present

## 2021-08-15 DIAGNOSIS — K3189 Other diseases of stomach and duodenum: Secondary | ICD-10-CM | POA: Diagnosis not present

## 2021-08-15 DIAGNOSIS — E785 Hyperlipidemia, unspecified: Secondary | ICD-10-CM | POA: Diagnosis not present

## 2021-08-15 DIAGNOSIS — I10 Essential (primary) hypertension: Secondary | ICD-10-CM | POA: Diagnosis not present

## 2021-08-15 DIAGNOSIS — Z91040 Latex allergy status: Secondary | ICD-10-CM | POA: Diagnosis not present

## 2021-08-15 DIAGNOSIS — K222 Esophageal obstruction: Secondary | ICD-10-CM | POA: Diagnosis not present

## 2021-08-15 DIAGNOSIS — Z79890 Hormone replacement therapy (postmenopausal): Secondary | ICD-10-CM | POA: Diagnosis not present

## 2021-08-15 DIAGNOSIS — E119 Type 2 diabetes mellitus without complications: Secondary | ICD-10-CM | POA: Diagnosis not present

## 2021-08-15 DIAGNOSIS — K449 Diaphragmatic hernia without obstruction or gangrene: Secondary | ICD-10-CM | POA: Diagnosis not present

## 2021-08-15 DIAGNOSIS — M1990 Unspecified osteoarthritis, unspecified site: Secondary | ICD-10-CM | POA: Diagnosis not present

## 2021-08-15 DIAGNOSIS — K219 Gastro-esophageal reflux disease without esophagitis: Secondary | ICD-10-CM | POA: Diagnosis not present

## 2021-08-15 DIAGNOSIS — E669 Obesity, unspecified: Secondary | ICD-10-CM | POA: Diagnosis not present

## 2021-08-15 DIAGNOSIS — E079 Disorder of thyroid, unspecified: Secondary | ICD-10-CM | POA: Diagnosis not present

## 2021-08-15 DIAGNOSIS — Z6830 Body mass index (BMI) 30.0-30.9, adult: Secondary | ICD-10-CM | POA: Diagnosis not present

## 2021-08-15 LAB — BASIC METABOLIC PANEL
Anion gap: 5 (ref 5–15)
BUN: 20 mg/dL (ref 8–23)
CO2: 26 mmol/L (ref 22–32)
Calcium: 9 mg/dL (ref 8.9–10.3)
Chloride: 106 mmol/L (ref 98–111)
Creatinine, Ser: 1.09 mg/dL — ABNORMAL HIGH (ref 0.44–1.00)
GFR, Estimated: 54 mL/min — ABNORMAL LOW (ref >60)
Glucose, Bld: 175 mg/dL — ABNORMAL HIGH (ref 70–99)
Potassium: 4.8 mmol/L (ref 3.5–5.1)
Sodium: 137 mmol/L (ref 135–145)

## 2021-08-15 LAB — GLUCOSE, CAPILLARY
Glucose-Capillary: 160 mg/dL — ABNORMAL HIGH (ref 70–99)
Glucose-Capillary: 139 mg/dL — ABNORMAL HIGH (ref 70–99)
Glucose-Capillary: 152 mg/dL — ABNORMAL HIGH (ref 70–99)

## 2021-08-15 MED ORDER — SPIRONOLACTONE 25 MG PO TABS
25.0000 mg | ORAL_TABLET | Freq: Every day | ORAL | Status: DC
  Administered 2021-08-15 – 2021-08-16 (×2): 25 mg via ORAL
  Filled 2021-08-15 (×2): qty 1

## 2021-08-15 MED ORDER — INSULIN ASPART 100 UNIT/ML IJ SOLN
0.0000 [IU] | Freq: Three times a day (TID) | INTRAMUSCULAR | Status: DC
  Administered 2021-08-15 – 2021-08-16 (×2): 2 [IU] via SUBCUTANEOUS

## 2021-08-15 MED ORDER — IOHEXOL 300 MG/ML  SOLN
100.0000 mL | Freq: Once | INTRAMUSCULAR | Status: AC | PRN
  Administered 2021-08-15: 50 mL via ORAL

## 2021-08-15 MED ORDER — INSULIN ASPART 100 UNIT/ML IJ SOLN: 0.0000 [IU] | Freq: Every day | INTRAMUSCULAR | Status: DC

## 2021-08-15 MED ORDER — ENSURE ENLIVE PO LIQD
237.0000 mL | Freq: Two times a day (BID) | ORAL | Status: DC
  Administered 2021-08-15: 237 mL via ORAL

## 2021-08-15 MED ORDER — AMLODIPINE BESYLATE 5 MG PO TABS
5.0000 mg | ORAL_TABLET | Freq: Every day | ORAL | Status: DC
  Administered 2021-08-15 – 2021-08-16 (×2): 5 mg via ORAL
  Filled 2021-08-15 (×2): qty 1

## 2021-08-15 MED ORDER — HYDROCODONE-ACETAMINOPHEN 7.5-325 MG/15ML PO SOLN
10.0000 mL | ORAL | Status: DC | PRN
  Administered 2021-08-15 – 2021-08-16 (×2): 10 mL via ORAL
  Filled 2021-08-15 (×2): qty 15

## 2021-08-15 MED ORDER — SIMETHICONE 80 MG PO CHEW
80.0000 mg | CHEWABLE_TABLET | Freq: Four times a day (QID) | ORAL | Status: DC | PRN
  Administered 2021-08-15 – 2021-08-16 (×2): 80 mg via ORAL
  Filled 2021-08-15 (×2): qty 1

## 2021-08-15 MED ORDER — LEVOTHYROXINE SODIUM 75 MCG PO TABS
75.0000 ug | ORAL_TABLET | Freq: Every day | ORAL | Status: DC
  Administered 2021-08-16: 75 ug via ORAL
  Filled 2021-08-15: qty 1

## 2021-08-15 NOTE — Progress Notes (Signed)
Initial Nutrition Assessment ? ?DOCUMENTATION CODES:  ? ?Not applicable ? ?INTERVENTION:  ?Provide Ensure Enlive po BID, each supplement provides 350 kcal and 20 grams of protein. ? ?Handout regarding diet after nissen fundoplication given. ? ?NUTRITION DIAGNOSIS:  ? ?Increased nutrient needs related to post-op healing as evidenced by estimated needs. ? ?GOAL:  ? ?Patient will meet greater than or equal to 90% of their needs ? ?MONITOR:  ? ?PO intake, Supplement acceptance, Labs, Diet advancement, Weight trends, Skin, I & O's ? ?REASON FOR ASSESSMENT:  ? ?Consult ?Diet education ? ?ASSESSMENT:  ? ?76 year old female with history of GERD, DM, HTN presents with dysphagia, reflux. CT scan revealed a 3.5 cm wide hiatal hernia. Pt underwent procedure for a robotic hiatal hernia repair with mesh and fundoplication. ? ?Procedure 3/21: XI ROBOTIC ASSISTED HIATAL HERNIA REPAIR WITH FUNDOPLICATION  ?INSERTION OF MESH ? ?Diet has been advanced to a full liquid diet. Pt reports she has been able to tolerate fluids from meal tray. Pt reports abdominal pain when moving. Pt trying to rest at time of visit. Pt unable to recall usual diet prior to admission. Pt reports possible discharge home tomorrow if pain improves. Per MD, esophagram negative for leak. Handout regarding diet after nissen fundoplication given. MD recommends pureed diet for 2 weeks post discharge. Pt reports understanding of needing to follow a pureed diet at home. Pt reports MD had discussed recommended diet with her prior. Handout additionally recommended small frequent meals, foods recommended, foods to avoid, and avoidance of straws. Pt reports no questions related to recommended diet for home. Unable to complete Nutrition-Focused physical exam at this time. Pt reports pain post op procedure. Pt with a 6% weight loss within 2 months per weight records. RD to order nutritional supplements to aid in adequate nutrition.  ? ?Labs and medications reviewed.  ? ?Diet  Order:   ?Diet Order   ? ?       ?  Diet full liquid Room service appropriate? No; Fluid consistency: Thin  Diet effective now       ?  ? ?  ?  ? ?  ? ? ?EDUCATION NEEDS:  ? ?Education needs have been addressed ? ?Skin:  Skin Assessment: Skin Integrity Issues: ?Skin Integrity Issues:: Incisions ?Incisions: abdomen ? ?Last BM:  3/20 ? ?Height:  ? ?Ht Readings from Last 1 Encounters:  ?08/14/21 '4\' 10"'$  (1.473 m)  ? ? ?Weight:  ? ?Wt Readings from Last 1 Encounters:  ?08/14/21 67.1 kg  ? ?BMI:  Body mass index is 30.93 kg/m?. ? ?Estimated Nutritional Needs:  ? ?Kcal:  1700-1900 ? ?Protein:  85-95 grams ? ?Fluid:  >/= 1.7 L/day ? ?Corrin Parker, MS, RD, LDN ?RD pager number/after hours weekend pager number on Amion. ? ?

## 2021-08-15 NOTE — Progress Notes (Signed)
Mobility Specialist Progress Note: ? ? 08/15/21 1300  ?Mobility  ?Activity Ambulated with assistance in hallway  ?Level of Assistance Standby assist, set-up cues, supervision of patient - no hands on  ?Assistive Device Front wheel walker  ?Distance Ambulated (ft) 800 ft  ?Activity Response Tolerated well  ?$Mobility charge 1 Mobility  ? ?Pt received standing in room wanting to ambulate. Complaints of abdominal pain. Left patient in bed with call bell in reach and all needs met. ? ?Thierry Dobosz ?Mobility Specialist ?Primary Phone [phone] ? ?

## 2021-08-15 NOTE — Progress Notes (Signed)
1 Day Post-Op  ? ?Subjective/Chief Complaint: ?Pt doing well this AM ?Just got back from RADS test ?Some abd soreness ? ? ?Objective: ?Vital signs in last 24 hours: ?Temp:  [97.4 ?F (36.3 ?C)-98.4 ?F (36.9 ?C)] 97.4 ?F (36.3 ?C) (03/22 9678) ?Pulse Rate:  [59-80] 80 (03/22 0852) ?Resp:  [8-19] 17 (03/22 9381) ?BP: (109-154)/(58-87) 154/76 (03/22 0175) ?SpO2:  [93 %-99 %] 94 % (03/22 0852) ?Last BM Date : 08/13/21 ? ?Intake/Output from previous day: ?03/21 0701 - 03/22 0700 ?In: 1025 [I.V.:1044] ?Out: 20 [Blood:20] ?Intake/Output this shift: ?No intake/output data recorded. ? ?General appearance: alert and cooperative ?GI: soft, non-tender; bowel sounds normal; no masses,  no organomegaly and inc c/d/i ? ?Lab Results:  ?No results for input(s): WBC, HGB, HCT, PLT in the last 72 hours. ?BMET ?Recent Labs  ?  08/15/21 ?0629  ?NA 137  ?K 4.8  ?CL 106  ?CO2 26  ?GLUCOSE 175*  ?BUN 20  ?CREATININE 1.09*  ?CALCIUM 9.0  ? ?PT/INR ?No results for input(s): LABPROT, INR in the last 72 hours. ?ABG ?No results for input(s): PHART, HCO3 in the last 72 hours. ? ?Invalid input(s): PCO2, PO2 ? ?Studies/Results: ?DG ESOPHAGUS W SINGLE CM (SOL OR THIN BA) ? ?Result Date: 08/15/2021 ?CLINICAL DATA:  76 year old female with history of large hiatal hernia s/p Toupet fundoplication 8/52/77. EXAM: ESOPHAGUS/BARIUM SWALLOW/TABLET STUDY TECHNIQUE: Single contrast examination was performed using Omnipaque. This exam was performed by Brynda Greathouse PA-C, and was supervised and interpreted by Nelson Chimes, MD. FLUOROSCOPY: Radiation Exposure Index (as provided by the fluoroscopic device): 8.7 mGy Kerma COMPARISON:  DG ESOPHAGUS 06/29/21 FINDINGS: Esophagus: Abrupt narrowing at the site of recent surgery with intermittent passing of contrast through the gastroesophageal junction. Esophageal motility: Within normal limits. Hiatal Hernia: None Gastroesophageal reflux: None visualized. Ingested 61m barium tablet: Not given Other: None. IMPRESSION:  Narrowing of the gastroesophageal junction as expected after recent hiatal hernia repair with passage of contrast into the stomach. No post-operative leak identified. Electronically Signed   By: MNelson ChimesM.D.   On: 08/15/2021 11:07   ? ?Anti-infectives: ?Anti-infectives (From admission, onward)  ? ? Start     Dose/Rate Route Frequency Ordered Stop  ? 08/14/21 0745  ceFAZolin (ANCEF) IVPB 2g/100 mL premix       ? 2 g ?200 mL/hr over 30 Minutes Intravenous On call to O.R. 08/14/21 0824203/21/23 0918  ? ?  ? ? ?Assessment/Plan: ?s/p Procedure(s): ?XI ROBOTIC ASSISTED HIATAL HERNIA REPAIR WITH FUNDOPLICATION (N/A) ?INSERTION OF MESH (N/A) ?Advance diet to fulls today as esophagram was neg for leak ?Con't to ambulate ?Will start PO pain meds to see if this helps her soreness ?Likely home tomorrow ?Restart home meds ? ? LOS: 0 days  ? ? ?ARalene Ok?08/15/2021 ? ?

## 2021-08-15 NOTE — TOC Progression Note (Signed)
Transition of Care (TOC) - Progression Note  ? ? ?Patient Details  ?Name: Susan Keith ?MRN: [id number] ?Date of Birth: 05/19/1946 ? ?Transition of Care (TOC) CM/SW Contact  ?Marilu Favre, RN ?Phone Number: ?08/15/2021, 9:55 AM ? ?Clinical Narrative:    ? ? ? ?Transition of Care (TOC) Screening Note ? ? ?Patient Details  ?Name: Susan Keith ?Date of Birth: 07-11-1945 ? ? ? ? ? ? ?Transition of Care Department [facility]) has reviewed patient and no TOC needs have been identified at this time. We will continue to monitor patient advancement through interdisciplinary progression rounds. If new patient transition needs arise, please place a TOC consult. ?  ?  ?  ? ?Expected Discharge Plan and Services ?  ?  ?  ?  ?  ?                ?  ?  ?  ?  ?  ?  ?  ?  ?  ?  ? ? ?Social Determinants of Health (SDOH) Interventions ?  ? ?Readmission Risk Interventions ?   ? View : No data to display.  ?  ?  ?  ? ? ?

## 2021-08-16 DIAGNOSIS — K449 Diaphragmatic hernia without obstruction or gangrene: Secondary | ICD-10-CM | POA: Diagnosis not present

## 2021-08-16 LAB — GLUCOSE, CAPILLARY: Glucose-Capillary: 138 mg/dL — ABNORMAL HIGH (ref 70–99)

## 2021-08-16 MED ORDER — ACETAMINOPHEN 160 MG/5ML PO SOLN: 500.0000 mg | Freq: Four times a day (QID) | ORAL | 0 refills | Status: DC | PRN

## 2021-08-16 NOTE — Plan of Care (Signed)
?  Problem: Activity: ?Goal: Risk for activity intolerance will decrease ?Outcome: Progressing ?  ?Problem: Nutrition: ?Goal: Adequate nutrition will be maintained ?Outcome: Progressing ?  ?Problem: Elimination: ?Goal: Will not experience complications related to bowel motility ?Outcome: Not Progressing ?  ?Problem: Pain Managment: ?Goal: General experience of comfort will improve ?Outcome: Progressing ?  ?

## 2021-08-16 NOTE — Discharge Summary (Signed)
Physician Discharge Summary  ?Patient ID: ?Susan Keith ?MRN: [id number] ?DOB/AGE: [birth year] 76 y.o. ? ?Admit date: 08/14/2021 ?Discharge date: 08/16/2021 ? ?Admission Diagnoses: Hiatal hernia ? ?Discharge Diagnoses:  ?Status post hiatal hernia repair with toupet fundoplication ? ? ?Discharged Condition: good ? ?Hospital Course: Patient is a 76 year old female who comes in secondary to a large hiatal hernia.  Patient underwent robotic hiatal hernia repair with toupee fundoplication.  Please see op note for full details. ? ?Patient postoperatively did well.  She underwent esophagram which revealed no leak.  Patient was started on liquid diet.  Patient had good pain control.  Patient was otherwise ambulating well on her own.  She was otherwise afebrile, deemed stable for discharge and discharged home. ? ?Consults: None ? ?Significant Diagnostic Studies: radiology:  Esophagram with no leak ? ?Treatments: surgery: As above ? ?Discharge Exam: ?Blood pressure 132/63, pulse 65, temperature 98.1 ?F (36.7 ?C), temperature source Oral, resp. rate 18, height '4\' 10"'$  (1.473 m), weight 67.1 kg, SpO2 95 %. ?General appearance: alert and cooperative ?GI: soft, non-tender; bowel sounds normal; no masses,  no organomegaly and incision clean dry and intact ? ?Disposition: Discharge disposition: 01-Home or Self Care ? ? ? ? ? ? ?Discharge Instructions   ? ? Diet - low sodium heart healthy   Complete by: As directed ?  ? Increase activity slowly   Complete by: As directed ?  ? ?  ? ?Allergies as of 08/16/2021   ?No Known Allergies ?  ? ?  ?Medication List  ?  ? ?TAKE these medications   ? ?acetaminophen 160 MG/5ML solution ?Commonly known as: TYLENOL ?Take 15.6 mLs (500 mg total) by mouth every 6 (six) hours as needed. ?  ?amLODipine 5 MG tablet ?Commonly known as: NORVASC ?TAKE 1 TABLET(5 MG) BY MOUTH DAILY ?  ?atorvastatin 20 MG tablet ?Commonly known as: LIPITOR ?Take 1 tablet (20 mg total) by mouth daily. ?  ?carvedilol 3.125 MG  tablet ?Commonly known as: COREG ?Take 3.125 mg by mouth 2 (two) times daily. ?  ?dicyclomine 10 MG capsule ?Commonly known as: BENTYL ?Take 1 capsule (10 mg total) by mouth 3 (three) times daily before meals. ?  ?fluticasone 50 MCG/ACT nasal spray ?Commonly known as: FLONASE ?Place 2 sprays into both nostrils daily. ?  ?Januvia 100 MG tablet ?Generic drug: sitaGLIPtin ?Take 100 mg by mouth daily. ?  ?levothyroxine 75 MCG tablet ?Commonly known as: SYNTHROID ?Take 75 mcg by mouth daily before breakfast. ?  ?lisinopril 20 MG tablet ?Commonly known as: ZESTRIL ?Take 1 tablet (20 mg total) by mouth daily. ?  ?ondansetron 4 MG tablet ?Commonly known as: ZOFRAN ?Take 1 tablet (4 mg total) by mouth 2 (two) times daily as needed for nausea or vomiting. ?  ?pantoprazole 40 MG tablet ?Commonly known as: PROTONIX ?Take 1 tablet (40 mg total) by mouth 2 (two) times daily. ?  ?spironolactone 25 MG tablet ?Commonly known as: ALDACTONE ?Take 25 mg by mouth daily. ?  ?Vitamin D3 125 MCG (5000 UT) Caps ?Take 5,000 Units by mouth daily. ?  ? ?  ? ? Follow-up Information   ? ? Ralene Ok, MD. Schedule an appointment as soon as possible for a visit in 2 week(s).   ?Specialty: General Surgery ?Why: Post op visit ?Contact information: ?[address] ?[place] [id number] ?[phone] ? ? ?  ?  ? ?  ?  ? ?  ? ? ?Signed: ?Ralene Ok ?08/16/2021, 8:39 AM ? ? ?

## 2021-08-16 NOTE — Plan of Care (Signed)
°  Problem: Education: °Goal: Knowledge of General Education information will improve °Description: Including pain rating scale, medication(s)/side effects and non-pharmacologic comfort measures °Outcome: Adequate for Discharge °  °Problem: Clinical Measurements: °Goal: Ability to maintain clinical measurements within normal limits will improve °Outcome: Adequate for Discharge °Goal: Will remain free from infection °Outcome: Adequate for Discharge °Goal: Diagnostic test results will improve °Outcome: Adequate for Discharge °Goal: Respiratory complications will improve °Outcome: Adequate for Discharge °Goal: Cardiovascular complication will be avoided °Outcome: Adequate for Discharge °  °

## 2021-08-16 NOTE — Progress Notes (Signed)
Mobility Specialist Progress Note: ? ? 08/16/21 1000  ?Mobility  ?Activity Ambulated with assistance in hallway  ?Level of Assistance Modified independent, requires aide device or extra time  ?Assistive Device Front wheel walker  ?Distance Ambulated (ft) 1120 ft  ?Activity Response Tolerated well  ?$Mobility charge 1 Mobility  ? ?Pt received in bed willing to participate in mobility. Complaints of 4/10 abdominal pain. Left in bed with call bell in reach and all needs met.  ? ?Susan Keith ?Mobility Specialist ?Primary Phone [phone] ? ?

## 2021-08-16 NOTE — Progress Notes (Signed)
Explained discharge details extensively with patient's daughter Aris Everts. Patient only speaks [place]. ? ?Daughter expressed having an understanding of the discharge instruction especially about her diet instructions. IV was removed and charted. Patient transported to the discharge lounge for husband to pick up ?

## 2021-08-17 NOTE — Telephone Encounter (Signed)
I can take them if they do not have a PCP already. ?Thanks, ?

## 2021-09-17 ENCOUNTER — Ambulatory Visit (INDEPENDENT_AMBULATORY_CARE_PROVIDER_SITE_OTHER): Payer: Medicare Other | Admitting: Internal Medicine

## 2021-09-17 ENCOUNTER — Encounter: Payer: Self-pay | Admitting: Internal Medicine

## 2021-09-17 VITALS — BP 120/80 | HR 77 | Temp 98.1°F | Ht <= 58 in | Wt 147.0 lb

## 2021-09-17 DIAGNOSIS — E1169 Type 2 diabetes mellitus with other specified complication: Secondary | ICD-10-CM

## 2021-09-17 DIAGNOSIS — E7800 Pure hypercholesterolemia, unspecified: Secondary | ICD-10-CM | POA: Diagnosis not present

## 2021-09-17 DIAGNOSIS — R7303 Prediabetes: Secondary | ICD-10-CM

## 2021-09-17 DIAGNOSIS — Z720 Tobacco use: Secondary | ICD-10-CM

## 2021-09-17 DIAGNOSIS — E538 Deficiency of other specified B group vitamins: Secondary | ICD-10-CM

## 2021-09-17 LAB — CBC WITH DIFFERENTIAL/PLATELET
Basophils Absolute: 0 10*3/uL (ref 0.0–0.1)
Basophils Relative: 0.4 % (ref 0.0–3.0)
Eosinophils Absolute: 0.1 10*3/uL (ref 0.0–0.7)
Eosinophils Relative: 1.6 % (ref 0.0–5.0)
HCT: 41.5 % (ref 36.0–46.0)
Hemoglobin: 13.6 g/dL (ref 12.0–15.0)
Lymphocytes Relative: 21.1 % (ref 12.0–46.0)
Lymphs Abs: 1.7 10*3/uL (ref 0.7–4.0)
MCHC: 32.8 g/dL (ref 30.0–36.0)
MCV: 89.4 fl (ref 78.0–100.0)
Monocytes Absolute: 0.7 10*3/uL (ref 0.1–1.0)
Monocytes Relative: 8.4 % (ref 3.0–12.0)
Neutro Abs: 5.6 10*3/uL (ref 1.4–7.7)
Neutrophils Relative %: 68.5 % (ref 43.0–77.0)
Platelets: 259 10*3/uL (ref 150.0–400.0)
RBC: 4.64 Mil/uL (ref 3.87–5.11)
RDW: 14.5 % (ref 11.5–15.5)
WBC: 8.1 10*3/uL (ref 4.0–10.5)

## 2021-09-17 LAB — COMPREHENSIVE METABOLIC PANEL
ALT: 17 U/L (ref 0–35)
AST: 21 U/L (ref 0–37)
Albumin: 4.4 g/dL (ref 3.5–5.2)
Alkaline Phosphatase: 74 U/L (ref 39–117)
BUN: 25 mg/dL — ABNORMAL HIGH (ref 6–23)
CO2: 27 mEq/L (ref 19–32)
Calcium: 9.5 mg/dL (ref 8.4–10.5)
Chloride: 103 mEq/L (ref 96–112)
Creatinine, Ser: 1.25 mg/dL — ABNORMAL HIGH (ref 0.40–1.20)
GFR: 42.76 mL/min — ABNORMAL LOW (ref >60.00)
Glucose, Bld: 129 mg/dL — ABNORMAL HIGH (ref 70–99)
Potassium: 4.9 mEq/L (ref 3.5–5.1)
Sodium: 138 mEq/L (ref 135–145)
Total Bilirubin: 0.4 mg/dL (ref 0.2–1.2)
Total Protein: 7.7 g/dL (ref 6.0–8.3)

## 2021-09-17 LAB — T4, FREE: Free T4: 0.65 ng/dL (ref 0.60–1.60)

## 2021-09-17 LAB — VITAMIN B12: Vitamin B-12: 407 pg/mL (ref 211–911)

## 2021-09-17 LAB — TSH: TSH: 9.6 u[IU]/mL — ABNORMAL HIGH (ref 0.35–5.50)

## 2021-09-17 MED ORDER — SPIRONOLACTONE 25 MG PO TABS: 25.0000 mg | ORAL_TABLET | Freq: Every day | ORAL | 3 refills | Status: DC

## 2021-09-17 MED ORDER — CARVEDILOL 3.125 MG PO TABS: 3.1250 mg | ORAL_TABLET | Freq: Two times a day (BID) | ORAL | 3 refills | Status: DC

## 2021-09-17 MED ORDER — METFORMIN HCL 500 MG PO TABS: 500.0000 mg | ORAL_TABLET | Freq: Every day | ORAL | 3 refills | Status: DC

## 2021-09-17 MED ORDER — AMLODIPINE BESYLATE 5 MG PO TABS: ORAL_TABLET | ORAL | 3 refills | Status: DC

## 2021-09-17 MED ORDER — SENNOSIDES-DOCUSATE SODIUM 8.6-50 MG PO TABS: 2.0000 | ORAL_TABLET | Freq: Every day | ORAL | 6 refills | Status: DC | PRN

## 2021-09-17 MED ORDER — LEVOTHYROXINE SODIUM 75 MCG PO TABS: 75.0000 ug | ORAL_TABLET | Freq: Every day | ORAL | 3 refills | Status: DC

## 2021-09-17 MED ORDER — PANTOPRAZOLE SODIUM 40 MG PO TBEC: 40.0000 mg | DELAYED_RELEASE_TABLET | Freq: Every day | ORAL | 3 refills | Status: DC

## 2021-09-17 NOTE — Progress Notes (Signed)
? ?Subjective:  ?Patient ID: Susan Keith, female    DOB: 10-26-1945  Age: 76 y.o. MRN: [id number] ? ?CC: No chief complaint on file. ? ? ?HPI ?Susan Keith presents for HTN, CAD, dyslipidemia ?Recent hiatal hernia surgery 08/14/21 - corrected - Dr Rosendo Gros ?C/o constipation ? ? ? ? ? ? ?Outpatient Medications Prior to Visit  ?Medication Sig Dispense Refill  ? acetaminophen (TYLENOL) 160 MG/5ML solution Take 15.6 mLs (500 mg total) by mouth every 6 (six) hours as needed. 120 mL 0  ? atorvastatin (LIPITOR) 20 MG tablet Take 1 tablet (20 mg total) by mouth daily. 90 tablet 3  ? Cholecalciferol (VITAMIN D3) 5000 units CAPS Take 5,000 Units by mouth daily.    ? dicyclomine (BENTYL) 10 MG capsule Take 1 capsule (10 mg total) by mouth 3 (three) times daily before meals. 90 capsule 4  ? fluticasone (FLONASE) 50 MCG/ACT nasal spray Place 2 sprays into both nostrils daily.    ? lisinopril (PRINIVIL,ZESTRIL) 20 MG tablet Take 1 tablet (20 mg total) by mouth daily. 90 tablet 3  ? ondansetron (ZOFRAN) 4 MG tablet Take 1 tablet (4 mg total) by mouth 2 (two) times daily as needed for nausea or vomiting. 60 tablet 4  ? amLODipine (NORVASC) 5 MG tablet TAKE 1 TABLET(5 MG) BY MOUTH DAILY 30 tablet 5  ? carvedilol (COREG) 3.125 MG tablet Take 3.125 mg by mouth 2 (two) times daily.    ? JANUVIA 100 MG tablet Take 100 mg by mouth daily.    ? levothyroxine (SYNTHROID) 75 MCG tablet Take 75 mcg by mouth daily before breakfast.    ? pantoprazole (PROTONIX) 40 MG tablet Take 1 tablet (40 mg total) by mouth 2 (two) times daily. 60 tablet 11  ? spironolactone (ALDACTONE) 25 MG tablet Take 25 mg by mouth daily.    ? ?No facility-administered medications prior to visit.  ? ? ?ROS: ?Review of Systems  ?Constitutional:  Negative for activity change, appetite change, chills, fatigue and unexpected weight change.  ?HENT:  Negative for congestion, mouth sores and sinus pressure.   ?Eyes:  Negative for visual disturbance.  ?Respiratory:  Negative  for cough and chest tightness.   ?Gastrointestinal:  Positive for abdominal distention and nausea. Negative for abdominal pain.  ?Genitourinary:  Negative for difficulty urinating, frequency and vaginal pain.  ?Musculoskeletal:  Positive for arthralgias. Negative for back pain and gait problem.  ?Skin:  Negative for pallor and rash.  ?Neurological:  Negative for dizziness, tremors, weakness, numbness and headaches.  ?Psychiatric/Behavioral:  Negative for confusion, sleep disturbance and suicidal ideas.   ? ?Objective:  ?BP 120/80 (BP Location: Left Arm, Patient Position: Sitting, Cuff Size: Large)   Pulse 77   Temp 98.1 ?F (36.7 ?C) (Oral)   Ht '4\' 10"'$  (1.473 m)   Wt 147 lb (66.7 kg)   SpO2 97%   BMI 30.72 kg/m?  ? ?BP Readings from Last 3 Encounters:  ?09/17/21 120/80  ?08/16/21 132/63  ?08/10/21 129/76  ? ? ?Wt Readings from Last 3 Encounters:  ?09/17/21 147 lb (66.7 kg)  ?08/14/21 148 lb (67.1 kg)  ?08/10/21 153 lb 4.8 oz (69.5 kg)  ? ? ?Physical Exam ?Constitutional:   ?   General: She is not in acute distress. ?   Appearance: She is well-developed. She is obese.  ?HENT:  ?   Head: Normocephalic.  ?   Right Ear: External ear normal.  ?   Left Ear: External ear normal.  ?   Nose: Nose normal.  ?  Eyes:  ?   General:     ?   Right eye: No discharge.     ?   Left eye: No discharge.  ?   Conjunctiva/sclera: Conjunctivae normal.  ?   Pupils: Pupils are equal, round, and reactive to light.  ?Neck:  ?   Thyroid: No thyromegaly.  ?   Vascular: No JVD.  ?   Trachea: No tracheal deviation.  ?Cardiovascular:  ?   Rate and Rhythm: Normal rate and regular rhythm.  ?   Heart sounds: Normal heart sounds.  ?Pulmonary:  ?   Effort: No respiratory distress.  ?   Breath sounds: No stridor. No wheezing.  ?Abdominal:  ?   General: Bowel sounds are normal. There is no distension.  ?   Palpations: Abdomen is soft. There is no mass.  ?   Tenderness: There is no abdominal tenderness. There is no guarding or rebound.   ?Musculoskeletal:     ?   General: No tenderness.  ?   Cervical back: Normal range of motion and neck supple. No rigidity.  ?Lymphadenopathy:  ?   Cervical: No cervical adenopathy.  ?Skin: ?   Findings: No erythema or rash.  ?Neurological:  ?   Cranial Nerves: No cranial nerve deficit.  ?   Motor: No abnormal muscle tone.  ?   Coordination: Coordination normal.  ?   Deep Tendon Reflexes: Reflexes normal.  ?Psychiatric:     ?   Behavior: Behavior normal.     ?   Thought Content: Thought content normal.     ?   Judgment: Judgment normal.  ?Distended abdomen ? ?Lab Results  ?Component Value Date  ? WBC 7.0 08/10/2021  ? HGB 14.4 08/10/2021  ? HCT 45.7 08/10/2021  ? PLT 291 08/10/2021  ? GLUCOSE 175 (H) 08/15/2021  ? CHOL 216 (H) 03/18/2018  ? TRIG 125 03/18/2018  ? HDL 58 03/18/2018  ? LDLCALC 133 (H) 03/18/2018  ? ALT 21 01/06/2017  ? AST 35 01/06/2017  ? NA 137 08/15/2021  ? K 4.8 08/15/2021  ? CL 106 08/15/2021  ? CREATININE 1.09 (H) 08/15/2021  ? BUN 20 08/15/2021  ? CO2 26 08/15/2021  ? TSH 6.480 (H) 05/01/2017  ? HGBA1C 6.6 (H) 08/10/2021  ? ? ?DG ESOPHAGUS W SINGLE CM (SOL OR THIN BA) ? ?Result Date: 08/15/2021 ?CLINICAL DATA:  76 year old female with history of large hiatal hernia s/p Toupet fundoplication 05/28/70. EXAM: ESOPHAGUS/BARIUM SWALLOW/TABLET STUDY TECHNIQUE: Single contrast examination was performed using Omnipaque. This exam was performed by Brynda Greathouse PA-C, and was supervised and interpreted by Nelson Chimes, MD. FLUOROSCOPY: Radiation Exposure Index (as provided by the fluoroscopic device): 8.7 mGy Kerma COMPARISON:  DG ESOPHAGUS 06/29/21 FINDINGS: Esophagus: Abrupt narrowing at the site of recent surgery with intermittent passing of contrast through the gastroesophageal junction. Esophageal motility: Within normal limits. Hiatal Hernia: None Gastroesophageal reflux: None visualized. Ingested 69m barium tablet: Not given Other: None. IMPRESSION: Narrowing of the gastroesophageal junction as  expected after recent hiatal hernia repair with passage of contrast into the stomach. No post-operative leak identified. Electronically Signed   By: MNelson ChimesM.D.   On: 08/15/2021 11:07  ? ? ?Assessment & Plan:  ? ?Problem List Items Addressed This Visit   ? ? Prediabetes  ?  Re-start Metformin ?Check A1c ? ?  ?  ? Relevant Orders  ? TSH  ? CBC with Differential/Platelet  ? Comprehensive metabolic panel  ? T4, free  ? Diabetes  mellitus ([place])  ?  On Januvia - will d/c ?Was on Metformin before - re-start Metformin ? ?  ?  ? Relevant Medications  ? metFORMIN (GLUCOPHAGE) 500 MG tablet  ? Other Relevant Orders  ? TSH  ? CBC with Differential/Platelet  ? Comprehensive metabolic panel  ? T4, free  ? Tobacco use  ?  Smoking 3 cigs/d ? ?  ?  ? Hypercholesterolemia  ?  On Lipitor ? ?  ?  ? Relevant Medications  ? amLODipine (NORVASC) 5 MG tablet  ? carvedilol (COREG) 3.125 MG tablet  ? spironolactone (ALDACTONE) 25 MG tablet  ? ?Other Visit Diagnoses   ? ? B12 deficiency    -  Primary  ? Relevant Orders  ? Vitamin B12  ? ?  ?  ? ? ?Meds ordered this encounter  ?Medications  ? senna-docusate (SENOKOT-S) 8.6-50 MG tablet  ?  Sig: Take 2 tablets by mouth daily as needed for moderate constipation.  ?  Dispense:  100 tablet  ?  Refill:  6  ? metFORMIN (GLUCOPHAGE) 500 MG tablet  ?  Sig: Take 1 tablet (500 mg total) by mouth daily with breakfast.  ?  Dispense:  90 tablet  ?  Refill:  3  ? pantoprazole (PROTONIX) 40 MG tablet  ?  Sig: Take 1 tablet (40 mg total) by mouth daily.  ?  Dispense:  90 tablet  ?  Refill:  3  ? amLODipine (NORVASC) 5 MG tablet  ?  Sig: TAKE 1 TABLET(5 MG) BY MOUTH DAILY  ?  Dispense:  90 tablet  ?  Refill:  3  ? carvedilol (COREG) 3.125 MG tablet  ?  Sig: Take 1 tablet (3.125 mg total) by mouth 2 (two) times daily.  ?  Dispense:  180 tablet  ?  Refill:  3  ? levothyroxine (SYNTHROID) 75 MCG tablet  ?  Sig: Take 1 tablet (75 mcg total) by mouth daily before breakfast.  ?  Dispense:  90 tablet  ?   Refill:  3  ? spironolactone (ALDACTONE) 25 MG tablet  ?  Sig: Take 1 tablet (25 mg total) by mouth daily.  ?  Dispense:  90 tablet  ?  Refill:  3  ?  ? ? ?Follow-up: Return in about 2 weeks (around 10/01/2021) for a follow-up visit. ?

## 2021-09-17 NOTE — Assessment & Plan Note (Signed)
Smoking 3 cigs/d ?

## 2021-09-17 NOTE — Assessment & Plan Note (Addendum)
On Januvia - will d/c ?Was on Metformin before - re-start Metformin ?

## 2021-09-17 NOTE — Patient Instructions (Signed)
Stop Januvia ?Start Metformin, [place]ot ?Stop Atorvastatin ?

## 2021-09-17 NOTE — Assessment & Plan Note (Signed)
On Lipitor 

## 2021-09-17 NOTE — Assessment & Plan Note (Addendum)
Re-start Metformin Check A1c 

## 2021-09-18 ENCOUNTER — Other Ambulatory Visit: Payer: Self-pay | Admitting: Internal Medicine

## 2021-09-18 MED ORDER — LEVOTHYROXINE SODIUM 88 MCG PO TABS: 88.0000 ug | ORAL_TABLET | Freq: Every day | ORAL | 3 refills | Status: DC

## 2021-09-19 DIAGNOSIS — Z20822 Contact with and (suspected) exposure to covid-19: Secondary | ICD-10-CM | POA: Diagnosis not present

## 2021-10-01 DIAGNOSIS — Z20822 Contact with and (suspected) exposure to covid-19: Secondary | ICD-10-CM | POA: Diagnosis not present

## 2021-10-02 ENCOUNTER — Telehealth: Payer: Self-pay | Admitting: Family Medicine

## 2021-10-02 NOTE — Telephone Encounter (Signed)
Connected to [facility] 4.29.2023. ? ?Caller states her mother has abdominal pain mostly ?to the RLQ. Her diarrhea is a weird color. She had ?hernia repair surgery 6 weeks ago. Got mom on the ?line to triage. Taking charcoal tablets. Today, had ?3 times. Nausea. No antibiotics. No fever. Diarrhea ?started on and off 2 weeks. Drinking. Voided recently. ? ?Advised to see PCP within 24 hours.  ?

## 2021-10-03 NOTE — Telephone Encounter (Signed)
Agree ?Pls sch w/any provider ?Thx ?

## 2021-10-04 ENCOUNTER — Telehealth: Payer: Self-pay | Admitting: Family Medicine

## 2021-10-04 ENCOUNTER — Telehealth: Payer: Self-pay | Admitting: Internal Medicine

## 2021-10-04 NOTE — Telephone Encounter (Signed)
Patients daughter brought FMLA forms to be filled out. ?Forms placed in Dr. Judeen Hammans bin up front ? ?Daughter:  Fredderick Phenix - [phone] when forms are ready ?

## 2021-10-09 DIAGNOSIS — R197 Diarrhea, unspecified: Secondary | ICD-10-CM | POA: Diagnosis not present

## 2021-10-09 DIAGNOSIS — Z8719 Personal history of other diseases of the digestive system: Secondary | ICD-10-CM | POA: Diagnosis not present

## 2021-10-12 NOTE — Telephone Encounter (Signed)
Encounter started in error.

## 2021-10-29 ENCOUNTER — Ambulatory Visit (INDEPENDENT_AMBULATORY_CARE_PROVIDER_SITE_OTHER): Payer: Medicare Other | Admitting: Internal Medicine

## 2021-10-29 ENCOUNTER — Encounter: Payer: Self-pay | Admitting: Internal Medicine

## 2021-10-29 DIAGNOSIS — R197 Diarrhea, unspecified: Secondary | ICD-10-CM | POA: Diagnosis not present

## 2021-10-29 DIAGNOSIS — R110 Nausea: Secondary | ICD-10-CM | POA: Diagnosis not present

## 2021-10-29 MED ORDER — METRONIDAZOLE 500 MG PO TABS: 500.0000 mg | ORAL_TABLET | Freq: Three times a day (TID) | ORAL | 0 refills | Status: AC

## 2021-10-29 MED ORDER — DIPHENOXYLATE-ATROPINE 2.5-0.025 MG PO TABS: 1.0000 | ORAL_TABLET | Freq: Four times a day (QID) | ORAL | 0 refills | Status: DC | PRN

## 2021-10-29 MED ORDER — PROMETHAZINE HCL 25 MG PO TABS: 25.0000 mg | ORAL_TABLET | Freq: Four times a day (QID) | ORAL | 0 refills | Status: DC | PRN

## 2021-10-29 MED ORDER — METFORMIN HCL 500 MG PO TABS: 500.0000 mg | ORAL_TABLET | Freq: Two times a day (BID) | ORAL | 3 refills | Status: DC

## 2021-10-29 MED ORDER — PANTOPRAZOLE SODIUM 40 MG PO TBEC: 40.0000 mg | DELAYED_RELEASE_TABLET | Freq: Every day | ORAL | 3 refills | Status: DC

## 2021-10-29 MED ORDER — SACCHAROMYCES BOULARDII 250 MG PO CAPS: 250.0000 mg | ORAL_CAPSULE | Freq: Two times a day (BID) | ORAL | 1 refills | Status: DC

## 2021-10-29 NOTE — Progress Notes (Signed)
Subjective:  Patient ID: Susan Keith, female    DOB: 06/16/1945  Age: 76 y.o. MRN: [id number]  CC: No chief complaint on file.   HPI Susan Keith presents for severe nausea after surgery all the time - worse, dry mouth, still hungry.  C/o a lot of gas. C/o diarrhea - re-started 5/day since Fri. Pt took PCN abx again recently    Outpatient Medications Prior to Visit  Medication Sig Dispense Refill   acetaminophen (TYLENOL) 160 MG/5ML solution Take 15.6 mLs (500 mg total) by mouth every 6 (six) hours as needed. 120 mL 0   amLODipine (NORVASC) 5 MG tablet TAKE 1 TABLET(5 MG) BY MOUTH DAILY 90 tablet 3   atorvastatin (LIPITOR) 20 MG tablet Take 1 tablet (20 mg total) by mouth daily. 90 tablet 3   carvedilol (COREG) 3.125 MG tablet Take 1 tablet (3.125 mg total) by mouth 2 (two) times daily. 180 tablet 3   Cholecalciferol (VITAMIN D3) 5000 units CAPS Take 5,000 Units by mouth daily.     dicyclomine (BENTYL) 10 MG capsule Take 1 capsule (10 mg total) by mouth 3 (three) times daily before meals. 90 capsule 4   fluticasone (FLONASE) 50 MCG/ACT nasal spray Place 2 sprays into both nostrils daily.     levothyroxine (SYNTHROID) 88 MCG tablet Take 1 tablet (88 mcg total) by mouth daily. 90 tablet 3   senna-docusate (SENOKOT-S) 8.6-50 MG tablet Take 2 tablets by mouth daily as needed for moderate constipation. 100 tablet 6   spironolactone (ALDACTONE) 25 MG tablet Take 1 tablet (25 mg total) by mouth daily. 90 tablet 3   lisinopril (PRINIVIL,ZESTRIL) 20 MG tablet Take 1 tablet (20 mg total) by mouth daily. 90 tablet 3   metFORMIN (GLUCOPHAGE) 500 MG tablet Take 1 tablet (500 mg total) by mouth daily with breakfast. 90 tablet 3   omeprazole (PRILOSEC) 40 MG capsule Take 40 mg by mouth daily.     ondansetron (ZOFRAN) 4 MG tablet Take 1 tablet (4 mg total) by mouth 2 (two) times daily as needed for nausea or vomiting. 60 tablet 4   pantoprazole (PROTONIX) 40 MG tablet Take 1 tablet (40 mg total)  by mouth daily. 90 tablet 3   penicillin v potassium (VEETID) 500 MG tablet Take 500 mg by mouth 4 (four) times daily.     No facility-administered medications prior to visit.    ROS: Review of Systems  Constitutional:  Positive for fatigue. Negative for activity change, appetite change, chills and unexpected weight change.  HENT:  Negative for congestion, mouth sores and sinus pressure.   Eyes:  Negative for visual disturbance.  Respiratory:  Negative for cough and chest tightness.   Gastrointestinal:  Positive for diarrhea and nausea. Negative for abdominal pain, blood in stool, constipation and vomiting.  Genitourinary:  Negative for difficulty urinating, frequency and vaginal pain.  Musculoskeletal:  Negative for back pain and gait problem.  Skin:  Negative for pallor and rash.  Neurological:  Negative for dizziness, tremors, weakness, numbness and headaches.  Psychiatric/Behavioral:  Negative for confusion and sleep disturbance.    Objective:  BP 110/70 (BP Location: Left Arm, Patient Position: Sitting, Cuff Size: Large)   Pulse (!) 59   Temp 98.2 F (36.8 C) (Oral)   Ht '4\' 10"'$  (1.473 m)   Wt 143 lb (64.9 kg)   SpO2 98%   BMI 29.89 kg/m   BP Readings from Last 3 Encounters:  10/29/21 110/70  09/17/21 120/80  08/16/21 132/63  Wt Readings from Last 3 Encounters:  10/29/21 143 lb (64.9 kg)  09/17/21 147 lb (66.7 kg)  08/14/21 148 lb (67.1 kg)    Physical Exam Constitutional:      General: She is not in acute distress.    Appearance: She is well-developed. She is obese.  HENT:     Head: Normocephalic.     Right Ear: External ear normal.     Left Ear: External ear normal.     Nose: Nose normal.  Eyes:     General:        Right eye: No discharge.        Left eye: No discharge.     Conjunctiva/sclera: Conjunctivae normal.     Pupils: Pupils are equal, round, and reactive to light.  Neck:     Thyroid: No thyromegaly.     Vascular: No JVD.     Trachea: No  tracheal deviation.  Cardiovascular:     Rate and Rhythm: Normal rate and regular rhythm.     Heart sounds: Normal heart sounds.  Pulmonary:     Effort: No respiratory distress.     Breath sounds: No stridor. No wheezing.  Abdominal:     General: Bowel sounds are normal. There is no distension.     Palpations: Abdomen is soft. There is no mass.     Tenderness: There is no abdominal tenderness. There is no guarding or rebound.  Musculoskeletal:        General: No tenderness.     Cervical back: Normal range of motion and neck supple. No rigidity.  Lymphadenopathy:     Cervical: No cervical adenopathy.  Skin:    Findings: No erythema or rash.  Neurological:     Mental Status: She is oriented to person, place, and time.     Cranial Nerves: No cranial nerve deficit.     Motor: No abnormal muscle tone.     Coordination: Coordination normal.     Deep Tendon Reflexes: Reflexes normal.  Psychiatric:        Behavior: Behavior normal.        Thought Content: Thought content normal.        Judgment: Judgment normal.    Lab Results  Component Value Date   WBC 8.1 09/17/2021   HGB 13.6 09/17/2021   HCT 41.5 09/17/2021   PLT 259.0 09/17/2021   GLUCOSE 129 (H) 09/17/2021   CHOL 216 (H) 03/18/2018   TRIG 125 03/18/2018   HDL 58 03/18/2018   LDLCALC 133 (H) 03/18/2018   ALT 17 09/17/2021   AST 21 09/17/2021   NA 138 09/17/2021   K 4.9 09/17/2021   CL 103 09/17/2021   CREATININE 1.25 (H) 09/17/2021   BUN 25 (H) 09/17/2021   CO2 27 09/17/2021   TSH 9.60 (H) 09/17/2021   HGBA1C 6.6 (H) 08/10/2021    DG ESOPHAGUS W SINGLE CM (SOL OR THIN BA)  Result Date: 08/15/2021 CLINICAL DATA:  76 year old female with history of large hiatal hernia s/p Toupet fundoplication 06/15/39. EXAM: ESOPHAGUS/BARIUM SWALLOW/TABLET STUDY TECHNIQUE: Single contrast examination was performed using Omnipaque. This exam was performed by Brynda Greathouse PA-C, and was supervised and interpreted by Nelson Chimes, MD.  FLUOROSCOPY: Radiation Exposure Index (as provided by the fluoroscopic device): 8.7 mGy Kerma COMPARISON:  DG ESOPHAGUS 06/29/21 FINDINGS: Esophagus: Abrupt narrowing at the site of recent surgery with intermittent passing of contrast through the gastroesophageal junction. Esophageal motility: Within normal limits. Hiatal Hernia: None Gastroesophageal reflux: None visualized.  Ingested 29m barium tablet: Not given Other: None. IMPRESSION: Narrowing of the gastroesophageal junction as expected after recent hiatal hernia repair with passage of contrast into the stomach. No post-operative leak identified. Electronically Signed   By: MNelson ChimesM.D.   On: 08/15/2021 11:07    Assessment & Plan:   Problem List Items Addressed This Visit     Diarrhea   Relevant Orders   Clostridium difficile EIA   Nausea without vomiting    C/o severe nausea after surgery all the time - worse, dry mouth, still hungry.  C/o a lot of gas. C/o diarrhea - re-started. Pt took PCN abx again recently         Meds ordered this encounter  Medications   pantoprazole (PROTONIX) 40 MG tablet    Sig: Take 1 tablet (40 mg total) by mouth daily.    Dispense:  90 tablet    Refill:  3   promethazine (PHENERGAN) 25 MG tablet    Sig: Take 1 tablet (25 mg total) by mouth every 6 (six) hours as needed for up to 7 days for nausea or vomiting.    Dispense:  30 tablet    Refill:  0   diphenoxylate-atropine (LOMOTIL) 2.5-0.025 MG tablet    Sig: Take 1 tablet by mouth 4 (four) times daily as needed for diarrhea or loose stools.    Dispense:  60 tablet    Refill:  0   metroNIDAZOLE (FLAGYL) 500 MG tablet    Sig: Take 1 tablet (500 mg total) by mouth 3 (three) times daily for 7 days.    Dispense:  30 tablet    Refill:  0   saccharomyces boulardii (FLORASTOR) 250 MG capsule    Sig: Take 1 capsule (250 mg total) by mouth 2 (two) times daily.    Dispense:  60 capsule    Refill:  1   metFORMIN (GLUCOPHAGE) 500 MG tablet    Sig:  Take 1 tablet (500 mg total) by mouth 2 (two) times daily with a meal.    Dispense:  180 tablet    Refill:  3      Follow-up: Return in about 4 weeks (around 11/26/2021) for a follow-up visit.  AWalker Kehr MD

## 2021-10-29 NOTE — Patient Instructions (Addendum)
Stop Lisinopril, Atorvastatin

## 2021-10-29 NOTE — Assessment & Plan Note (Signed)
C/o severe nausea after surgery all the time - worse, dry mouth, still hungry.  C/o a lot of gas. C/o diarrhea - re-started. Pt took PCN abx again recently

## 2021-11-07 ENCOUNTER — Other Ambulatory Visit: Payer: Medicare Other

## 2021-11-07 DIAGNOSIS — R197 Diarrhea, unspecified: Secondary | ICD-10-CM | POA: Diagnosis not present

## 2021-11-09 LAB — CLOSTRIDIUM DIFFICILE EIA: C difficile Toxins A+B, EIA: NEGATIVE

## 2021-11-09 LAB — SPECIMEN STATUS REPORT

## 2021-11-14 ENCOUNTER — Encounter: Payer: Self-pay | Admitting: Internal Medicine

## 2021-11-14 ENCOUNTER — Ambulatory Visit (INDEPENDENT_AMBULATORY_CARE_PROVIDER_SITE_OTHER): Payer: Medicare Other | Admitting: Internal Medicine

## 2021-11-14 DIAGNOSIS — R140 Abdominal distension (gaseous): Secondary | ICD-10-CM

## 2021-11-14 DIAGNOSIS — R197 Diarrhea, unspecified: Secondary | ICD-10-CM | POA: Diagnosis not present

## 2021-11-14 DIAGNOSIS — R110 Nausea: Secondary | ICD-10-CM

## 2021-11-14 DIAGNOSIS — E7800 Pure hypercholesterolemia, unspecified: Secondary | ICD-10-CM

## 2021-11-14 DIAGNOSIS — E1169 Type 2 diabetes mellitus with other specified complication: Secondary | ICD-10-CM

## 2021-11-14 MED ORDER — REPAGLINIDE 0.5 MG PO TABS: 0.5000 mg | ORAL_TABLET | Freq: Three times a day (TID) | ORAL | 3 refills | Status: DC

## 2021-11-14 NOTE — Assessment & Plan Note (Signed)
Will discontinue metformin due to GI problems following Nissen's fundoplication.  I am concerned about diabetic gastroparesis following gastric surgery...  We will switch to Prandin with meals in place of metformin.

## 2021-11-14 NOTE — Assessment & Plan Note (Signed)
Continue to hold Lipitor due to GI symptoms

## 2021-11-14 NOTE — Assessment & Plan Note (Signed)
Will discontinue metformin due to GI problems following Nissen's fundoplication.  I am concerned about diabetic gastroparesis... We will switch to Prandin with meals.

## 2021-11-14 NOTE — Patient Instructions (Signed)
Stop Metformin  Start Repaglinide

## 2021-11-14 NOTE — Assessment & Plan Note (Signed)
Resolved.  C. difficile test was negative

## 2021-11-14 NOTE — Progress Notes (Signed)
Subjective:  Patient ID: Susan Keith, female    DOB: Feb 04, 1946  Age: 76 y.o. MRN: [id number]  CC: No chief complaint on file.   HPI [employer] presents for bloating and bitter taste, nausea.  Diarrhea has resolved.  Bloating is little better on Flagyl/probiotic.  Outpatient Medications Prior to Visit  Medication Sig Dispense Refill   acetaminophen (TYLENOL) 160 MG/5ML solution Take 15.6 mLs (500 mg total) by mouth every 6 (six) hours as needed. 120 mL 0   amLODipine (NORVASC) 5 MG tablet TAKE 1 TABLET(5 MG) BY MOUTH DAILY 90 tablet 3   carvedilol (COREG) 3.125 MG tablet Take 1 tablet (3.125 mg total) by mouth 2 (two) times daily. 180 tablet 3   Cholecalciferol (VITAMIN D3) 5000 units CAPS Take 5,000 Units by mouth daily.     dicyclomine (BENTYL) 10 MG capsule Take 1 capsule (10 mg total) by mouth 3 (three) times daily before meals. 90 capsule 4   diphenoxylate-atropine (LOMOTIL) 2.5-0.025 MG tablet Take 1 tablet by mouth 4 (four) times daily as needed for diarrhea or loose stools. 60 tablet 0   fluticasone (FLONASE) 50 MCG/ACT nasal spray Place 2 sprays into both nostrils daily.     levothyroxine (SYNTHROID) 88 MCG tablet Take 1 tablet (88 mcg total) by mouth daily. 90 tablet 3   pantoprazole (PROTONIX) 40 MG tablet Take 1 tablet (40 mg total) by mouth daily. 90 tablet 3   saccharomyces boulardii (FLORASTOR) 250 MG capsule Take 1 capsule (250 mg total) by mouth 2 (two) times daily. 60 capsule 1   senna-docusate (SENOKOT-S) 8.6-50 MG tablet Take 2 tablets by mouth daily as needed for moderate constipation. 100 tablet 6   spironolactone (ALDACTONE) 25 MG tablet Take 1 tablet (25 mg total) by mouth daily. 90 tablet 3   atorvastatin (LIPITOR) 20 MG tablet Take 1 tablet (20 mg total) by mouth daily. 90 tablet 3   metFORMIN (GLUCOPHAGE) 500 MG tablet Take 1 tablet (500 mg total) by mouth 2 (two) times daily with a meal. 180 tablet 3   promethazine (PHENERGAN) 25 MG tablet Take 1 tablet  (25 mg total) by mouth every 6 (six) hours as needed for up to 7 days for nausea or vomiting. 30 tablet 0   No facility-administered medications prior to visit.    ROS: Review of Systems  Constitutional:  Negative for activity change, appetite change, chills, fatigue, fever and unexpected weight change.  HENT:  Negative for congestion, mouth sores and sinus pressure.   Eyes:  Negative for visual disturbance.  Respiratory:  Negative for cough and chest tightness.   Gastrointestinal:  Positive for abdominal distention. Negative for abdominal pain, constipation, diarrhea, nausea and vomiting.  Genitourinary:  Negative for difficulty urinating, frequency and vaginal pain.  Musculoskeletal:  Negative for back pain and gait problem.  Skin:  Negative for pallor and rash.  Neurological:  Negative for dizziness, tremors, weakness, numbness and headaches.  Psychiatric/Behavioral:  Negative for confusion and sleep disturbance.     Objective:  Ht '4\' 10"'$  (1.473 m)   BMI 29.89 kg/m   BP Readings from Last 3 Encounters:  10/29/21 110/70  09/17/21 120/80  08/16/21 132/63    Wt Readings from Last 3 Encounters:  10/29/21 143 lb (64.9 kg)  09/17/21 147 lb (66.7 kg)  08/14/21 148 lb (67.1 kg)    Physical Exam Constitutional:      General: She is not in acute distress.    Appearance: She is well-developed. She is obese.  HENT:  Head: Normocephalic.     Right Ear: External ear normal.     Left Ear: External ear normal.     Nose: Nose normal.  Eyes:     General:        Right eye: No discharge.        Left eye: No discharge.     Conjunctiva/sclera: Conjunctivae normal.     Pupils: Pupils are equal, round, and reactive to light.  Neck:     Thyroid: No thyromegaly.     Vascular: No JVD.     Trachea: No tracheal deviation.  Cardiovascular:     Rate and Rhythm: Normal rate and regular rhythm.     Heart sounds: Normal heart sounds.  Pulmonary:     Effort: No respiratory distress.      Breath sounds: No stridor. No wheezing.  Abdominal:     General: Bowel sounds are normal. There is no distension.     Palpations: Abdomen is soft. There is no mass.     Tenderness: There is no abdominal tenderness. There is no guarding or rebound.  Musculoskeletal:        General: No tenderness.     Cervical back: Normal range of motion and neck supple. No rigidity.  Lymphadenopathy:     Cervical: No cervical adenopathy.  Skin:    Findings: No erythema or rash.  Neurological:     Cranial Nerves: No cranial nerve deficit.     Motor: No abnormal muscle tone.     Coordination: Coordination normal.     Deep Tendon Reflexes: Reflexes normal.  Psychiatric:        Behavior: Behavior normal.        Thought Content: Thought content normal.        Judgment: Judgment normal.   Abdomen is soft.  No jaundice  Lab Results  Component Value Date   WBC 8.1 09/17/2021   HGB 13.6 09/17/2021   HCT 41.5 09/17/2021   PLT 259.0 09/17/2021   GLUCOSE 129 (H) 09/17/2021   CHOL 216 (H) 03/18/2018   TRIG 125 03/18/2018   HDL 58 03/18/2018   LDLCALC 133 (H) 03/18/2018   ALT 17 09/17/2021   AST 21 09/17/2021   NA 138 09/17/2021   K 4.9 09/17/2021   CL 103 09/17/2021   CREATININE 1.25 (H) 09/17/2021   BUN 25 (H) 09/17/2021   CO2 27 09/17/2021   TSH 9.60 (H) 09/17/2021   HGBA1C 6.6 (H) 08/10/2021    DG ESOPHAGUS W SINGLE CM (SOL OR THIN BA)  Result Date: 08/15/2021 CLINICAL DATA:  76 year old female with history of large hiatal hernia s/p Toupet fundoplication 1/85/63. EXAM: ESOPHAGUS/BARIUM SWALLOW/TABLET STUDY TECHNIQUE: Single contrast examination was performed using Omnipaque. This exam was performed by Brynda Greathouse PA-C, and was supervised and interpreted by Nelson Chimes, MD. FLUOROSCOPY: Radiation Exposure Index (as provided by the fluoroscopic device): 8.7 mGy Kerma COMPARISON:  DG ESOPHAGUS 06/29/21 FINDINGS: Esophagus: Abrupt narrowing at the site of recent surgery with intermittent  passing of contrast through the gastroesophageal junction. Esophageal motility: Within normal limits. Hiatal Hernia: None Gastroesophageal reflux: None visualized. Ingested 65m barium tablet: Not given Other: None. IMPRESSION: Narrowing of the gastroesophageal junction as expected after recent hiatal hernia repair with passage of contrast into the stomach. No post-operative leak identified. Electronically Signed   By: MNelson ChimesM.D.   On: 08/15/2021 11:07    Assessment & Plan:   Problem List Items Addressed This Visit     Abdominal bloating  Will discontinue metformin due to GI problems following Nissen's fundoplication.  I am concerned about diabetic gastroparesis... We will switch to Prandin with meals.      Diabetes mellitus ([place])    Will discontinue metformin due to GI problems following Nissen's fundoplication.  I am concerned about gastroparesis..  We will switch to Prandin with meals.      Relevant Medications   repaglinide (PRANDIN) 0.5 MG tablet   Diarrhea    Resolved.  C. difficile test was negative      Hypercholesterolemia    Continue to hold Lipitor due to GI symptoms      Nausea without vomiting    Will discontinue metformin due to GI problems following Nissen's fundoplication.  I am concerned about diabetic gastroparesis following gastric surgery...  We will switch to Prandin with meals in place of metformin.         Meds ordered this encounter  Medications   repaglinide (PRANDIN) 0.5 MG tablet    Sig: Take 1 tablet (0.5 mg total) by mouth 3 (three) times daily before meals.    Dispense:  90 tablet    Refill:  3      Follow-up: Return in about 6 weeks (around 12/26/2021) for a follow-up visit.  Walker Kehr, MD

## 2021-11-14 NOTE — Assessment & Plan Note (Signed)
Will discontinue metformin due to GI problems following Nissen's fundoplication.  I am concerned about gastroparesis..  We will switch to Prandin with meals.

## 2021-11-19 ENCOUNTER — Telehealth: Payer: Self-pay | Admitting: Internal Medicine

## 2021-11-21 NOTE — Telephone Encounter (Signed)
Pt daughter states she has some concerns with her moms new sxs of being dizzy and pressure around her eyes x3 days. Pt daughter states when the pt had these sxs a few years ago It was discovered her potassium levels were low and she also states she needs a resolution for her diarrhea as well.  Please order labs for pt prior to her next apptmnt on 11/28/2021.

## 2021-11-21 NOTE — Telephone Encounter (Signed)
Daughter requesting a call concerning mothers health issues/request reach out today

## 2021-11-22 NOTE — Telephone Encounter (Signed)
Her potassium was normal. Take Lomotil for diarrhea.  Okay to add Pepto-Bismol. Do not take carvedilol if dizzy. Keep ROV. Thanks

## 2021-11-22 NOTE — Telephone Encounter (Signed)
Pts daughter states she wants her moms potassium levels checked and states her moms diarrhea has went away.

## 2021-11-26 ENCOUNTER — Ambulatory Visit: Payer: Medicare Other | Admitting: Internal Medicine

## 2021-11-28 ENCOUNTER — Encounter: Payer: Self-pay | Admitting: Internal Medicine

## 2021-11-28 ENCOUNTER — Ambulatory Visit (INDEPENDENT_AMBULATORY_CARE_PROVIDER_SITE_OTHER): Payer: Medicare Other | Admitting: Internal Medicine

## 2021-11-28 DIAGNOSIS — E1169 Type 2 diabetes mellitus with other specified complication: Secondary | ICD-10-CM

## 2021-11-28 DIAGNOSIS — Z9889 Other specified postprocedural states: Secondary | ICD-10-CM

## 2021-11-28 DIAGNOSIS — N183 Chronic kidney disease, stage 3 unspecified: Secondary | ICD-10-CM

## 2021-11-28 DIAGNOSIS — G43109 Migraine with aura, not intractable, without status migrainosus: Secondary | ICD-10-CM

## 2021-11-28 DIAGNOSIS — E7800 Pure hypercholesterolemia, unspecified: Secondary | ICD-10-CM | POA: Diagnosis not present

## 2021-11-28 DIAGNOSIS — R197 Diarrhea, unspecified: Secondary | ICD-10-CM | POA: Diagnosis not present

## 2021-11-28 DIAGNOSIS — G43909 Migraine, unspecified, not intractable, without status migrainosus: Secondary | ICD-10-CM | POA: Insufficient documentation

## 2021-11-28 LAB — CBC WITH DIFFERENTIAL/PLATELET
Basophils Absolute: 0.1 10*3/uL (ref 0.0–0.1)
Basophils Relative: 0.7 % (ref 0.0–3.0)
Eosinophils Absolute: 0.2 10*3/uL (ref 0.0–0.7)
Eosinophils Relative: 3.3 % (ref 0.0–5.0)
HCT: 40.8 % (ref 36.0–46.0)
Hemoglobin: 13.3 g/dL (ref 12.0–15.0)
Lymphocytes Relative: 23.7 % (ref 12.0–46.0)
Lymphs Abs: 1.7 10*3/uL (ref 0.7–4.0)
MCHC: 32.6 g/dL (ref 30.0–36.0)
MCV: 90.1 fl (ref 78.0–100.0)
Monocytes Absolute: 0.8 10*3/uL (ref 0.1–1.0)
Monocytes Relative: 10.2 % (ref 3.0–12.0)
Neutro Abs: 4.6 10*3/uL (ref 1.4–7.7)
Neutrophils Relative %: 62.1 % (ref 43.0–77.0)
Platelets: 305 10*3/uL (ref 150.0–400.0)
RBC: 4.53 Mil/uL (ref 3.87–5.11)
RDW: 14.2 % (ref 11.5–15.5)
WBC: 7.3 10*3/uL (ref 4.0–10.5)

## 2021-11-28 LAB — COMPREHENSIVE METABOLIC PANEL
ALT: 11 U/L (ref 0–35)
AST: 16 U/L (ref 0–37)
Albumin: 4.3 g/dL (ref 3.5–5.2)
Alkaline Phosphatase: 74 U/L (ref 39–117)
BUN: 19 mg/dL (ref 6–23)
CO2: 27 mEq/L (ref 19–32)
Calcium: 9.5 mg/dL (ref 8.4–10.5)
Chloride: 108 mEq/L (ref 96–112)
Creatinine, Ser: 0.86 mg/dL (ref 0.40–1.20)
GFR: 66.88 mL/min (ref >60.00)
Glucose, Bld: 92 mg/dL (ref 70–99)
Potassium: 5.1 mEq/L (ref 3.5–5.1)
Sodium: 141 mEq/L (ref 135–145)
Total Bilirubin: 0.3 mg/dL (ref 0.2–1.2)
Total Protein: 7.1 g/dL (ref 6.0–8.3)

## 2021-11-28 LAB — MAGNESIUM: Magnesium: 2.1 mg/dL (ref 1.5–2.5)

## 2021-11-28 MED ORDER — RIZATRIPTAN BENZOATE 10 MG PO TABS: 10.0000 mg | ORAL_TABLET | Freq: Once | ORAL | 12 refills | Status: DC | PRN

## 2021-11-28 MED ORDER — FLUCONAZOLE 150 MG PO TABS: 150.0000 mg | ORAL_TABLET | Freq: Once | ORAL | 1 refills | Status: AC

## 2021-11-28 MED ORDER — GLUCOSE BLOOD VI STRP
ORAL_STRIP | 12 refills | Status: DC
Start: 2021-11-28 — End: 2021-12-06

## 2021-11-28 MED ORDER — REPAGLINIDE 1 MG PO TABS
1.0000 mg | ORAL_TABLET | Freq: Three times a day (TID) | ORAL | 11 refills | Status: DC
Start: 2021-11-28 — End: 2022-02-06

## 2021-11-28 NOTE — Assessment & Plan Note (Signed)
Feeling better.

## 2021-11-28 NOTE — Assessment & Plan Note (Signed)
We will increase Prandin to 1 mg with meals.

## 2021-11-28 NOTE — Assessment & Plan Note (Signed)
Hold Lipitor due to GI symptoms

## 2021-11-28 NOTE — Assessment & Plan Note (Signed)
Maxalt prn, Tylenol prn

## 2021-11-28 NOTE — Assessment & Plan Note (Signed)
Better  

## 2021-11-28 NOTE — Progress Notes (Signed)
Subjective:  Patient ID: Susan Keith, female    DOB: 04-10-1946  Age: 76 y.o. MRN: [id number]  CC: No chief complaint on file.   HPI Susan Keith presents for GI complaints - better off Lipitor and Metformin. On intermittent fasting  C/o occasional headaches w/nausea, vertigo  Outpatient Medications Prior to Visit  Medication Sig Dispense Refill   acetaminophen (TYLENOL) 160 MG/5ML solution Take 15.6 mLs (500 mg total) by mouth every 6 (six) hours as needed. 120 mL 0   amLODipine (NORVASC) 5 MG tablet TAKE 1 TABLET(5 MG) BY MOUTH DAILY 90 tablet 3   carvedilol (COREG) 3.125 MG tablet Take 1 tablet (3.125 mg total) by mouth 2 (two) times daily. 180 tablet 3   Cholecalciferol (VITAMIN D3) 5000 units CAPS Take 5,000 Units by mouth daily.     dicyclomine (BENTYL) 10 MG capsule Take 1 capsule (10 mg total) by mouth 3 (three) times daily before meals. 90 capsule 4   diphenoxylate-atropine (LOMOTIL) 2.5-0.025 MG tablet Take 1 tablet by mouth 4 (four) times daily as needed for diarrhea or loose stools. 60 tablet 0   fluticasone (FLONASE) 50 MCG/ACT nasal spray Place 2 sprays into both nostrils daily.     levothyroxine (SYNTHROID) 88 MCG tablet Take 1 tablet (88 mcg total) by mouth daily. 90 tablet 3   pantoprazole (PROTONIX) 40 MG tablet Take 1 tablet (40 mg total) by mouth daily. 90 tablet 3   saccharomyces boulardii (FLORASTOR) 250 MG capsule Take 1 capsule (250 mg total) by mouth 2 (two) times daily. 60 capsule 1   senna-docusate (SENOKOT-S) 8.6-50 MG tablet Take 2 tablets by mouth daily as needed for moderate constipation. 100 tablet 6   spironolactone (ALDACTONE) 25 MG tablet Take 1 tablet (25 mg total) by mouth daily. 90 tablet 3   repaglinide (PRANDIN) 0.5 MG tablet Take 1 tablet (0.5 mg total) by mouth 3 (three) times daily before meals. 90 tablet 3   No facility-administered medications prior to visit.    ROS: Review of Systems  Constitutional:  Negative for activity change,  appetite change, chills, fatigue and unexpected weight change.  HENT:  Negative for congestion, mouth sores and sinus pressure.   Eyes:  Negative for visual disturbance.  Respiratory:  Negative for cough and chest tightness.   Gastrointestinal:  Negative for abdominal pain and nausea.  Genitourinary:  Negative for difficulty urinating, frequency and vaginal pain.  Musculoskeletal:  Negative for back pain and gait problem.  Skin:  Negative for pallor and rash.  Neurological:  Positive for dizziness and headaches. Negative for tremors, weakness and numbness.  Psychiatric/Behavioral:  Negative for confusion and sleep disturbance. The patient is not nervous/anxious.     Objective:  BP 140/78 (BP Location: Left Arm, Patient Position: Sitting, Cuff Size: Normal)   Pulse 63   Temp 98.1 F (36.7 C) (Oral)   Ht '4\' 10"'$  (1.473 m)   Wt 140 lb (63.5 kg)   SpO2 95%   BMI 29.26 kg/m   BP Readings from Last 3 Encounters:  11/28/21 140/78  10/29/21 110/70  09/17/21 120/80    Wt Readings from Last 3 Encounters:  11/28/21 140 lb (63.5 kg)  10/29/21 143 lb (64.9 kg)  09/17/21 147 lb (66.7 kg)    Physical Exam Constitutional:      General: She is not in acute distress.    Appearance: She is well-developed. She is obese.  HENT:     Head: Normocephalic.     Right Ear: External ear normal.  Left Ear: External ear normal.     Nose: Nose normal.  Eyes:     General:        Right eye: No discharge.        Left eye: No discharge.     Conjunctiva/sclera: Conjunctivae normal.     Pupils: Pupils are equal, round, and reactive to light.  Neck:     Thyroid: No thyromegaly.     Vascular: No JVD.     Trachea: No tracheal deviation.  Cardiovascular:     Rate and Rhythm: Normal rate and regular rhythm.     Heart sounds: Normal heart sounds.  Pulmonary:     Effort: No respiratory distress.     Breath sounds: No stridor. No wheezing.  Abdominal:     General: Bowel sounds are normal. There is  no distension.     Palpations: Abdomen is soft. There is no mass.     Tenderness: There is no abdominal tenderness. There is no guarding or rebound.  Musculoskeletal:        General: No tenderness.     Cervical back: Normal range of motion and neck supple. No rigidity.  Lymphadenopathy:     Cervical: No cervical adenopathy.  Skin:    Findings: No erythema or rash.  Neurological:     Cranial Nerves: No cranial nerve deficit.     Motor: No abnormal muscle tone.     Coordination: Coordination normal.     Deep Tendon Reflexes: Reflexes normal.  Psychiatric:        Behavior: Behavior normal.        Thought Content: Thought content normal.        Judgment: Judgment normal.     Lab Results  Component Value Date   WBC 8.1 09/17/2021   HGB 13.6 09/17/2021   HCT 41.5 09/17/2021   PLT 259.0 09/17/2021   GLUCOSE 129 (H) 09/17/2021   CHOL 216 (H) 03/18/2018   TRIG 125 03/18/2018   HDL 58 03/18/2018   LDLCALC 133 (H) 03/18/2018   ALT 17 09/17/2021   AST 21 09/17/2021   NA 138 09/17/2021   K 4.9 09/17/2021   CL 103 09/17/2021   CREATININE 1.25 (H) 09/17/2021   BUN 25 (H) 09/17/2021   CO2 27 09/17/2021   TSH 9.60 (H) 09/17/2021   HGBA1C 6.6 (H) 08/10/2021    DG ESOPHAGUS W SINGLE CM (SOL OR THIN BA)  Result Date: 08/15/2021 CLINICAL DATA:  76 year old female with history of large hiatal hernia s/p Toupet fundoplication 09/03/79. EXAM: ESOPHAGUS/BARIUM SWALLOW/TABLET STUDY TECHNIQUE: Single contrast examination was performed using Omnipaque. This exam was performed by Brynda Greathouse PA-C, and was supervised and interpreted by Nelson Chimes, MD. FLUOROSCOPY: Radiation Exposure Index (as provided by the fluoroscopic device): 8.7 mGy Kerma COMPARISON:  DG ESOPHAGUS 06/29/21 FINDINGS: Esophagus: Abrupt narrowing at the site of recent surgery with intermittent passing of contrast through the gastroesophageal junction. Esophageal motility: Within normal limits. Hiatal Hernia: None  Gastroesophageal reflux: None visualized. Ingested 95m barium tablet: Not given Other: None. IMPRESSION: Narrowing of the gastroesophageal junction as expected after recent hiatal hernia repair with passage of contrast into the stomach. No post-operative leak identified. Electronically Signed   By: MNelson ChimesM.D.   On: 08/15/2021 11:07    Assessment & Plan:   Problem List Items Addressed This Visit     CRI (chronic [place] insufficiency), stage 3 (moderate[place]    Hydrate well      Diabetes mellitus [place]  We will increase Prandin to 1 mg with meals.      Relevant Medications   repaglinide (PRANDIN) 1 MG tablet   Diarrhea    Better      Relevant Orders   CBC with Differential/Platelet   Comprehensive metabolic panel   Magnesium   Hypercholesterolemia    Hold Lipitor due to GI symptoms      Migraines    Maxalt prn, Tylenol prn      Relevant Medications   rizatriptan (MAXALT) 10 MG tablet   Other Relevant Orders   CBC with Differential/Platelet   Comprehensive metabolic panel   Magnesium   S/P Nissen fundoplication (without gastrostomy tube) procedure    Feeling better         Meds ordered this encounter  Medications   repaglinide (PRANDIN) 1 MG tablet    Sig: Take 1 tablet (1 mg total) by mouth 3 (three) times daily before meals.    Dispense:  90 tablet    Refill:  11   rizatriptan (MAXALT) 10 MG tablet    Sig: Take 1 tablet (10 mg total) by mouth once as needed for up to 1 dose for migraine. May repeat in 2 hours if needed    Dispense:  12 tablet    Refill:  12   fluconazole (DIFLUCAN) 150 MG tablet    Sig: Take 1 tablet (150 mg total) by mouth once for 1 dose.    Dispense:  1 tablet    Refill:  1   glucose blood test strip    Sig: Use as instructed    Dispense:  100 each    Refill:  12      Follow-up: Return in about 6 weeks (around 01/09/2022) for a follow-up visit.  Walker Kehr, MD

## 2021-11-28 NOTE — Patient Instructions (Addendum)
Maxalt prn, Tylenol for headaches

## 2021-11-28 NOTE — Assessment & Plan Note (Signed)
Hydrate well 

## 2021-12-04 ENCOUNTER — Telehealth: Payer: Self-pay | Admitting: Family Medicine

## 2021-12-04 NOTE — Telephone Encounter (Signed)
Pt daughter Kipp Laurence is requesting a callback. She called in for lab results. I advised her "labs/tests are normal. No change in plans." She stated she would still like to speak to the nurse about the results because she doesn't understand how her moms results could be normal if she still isn't feeling any better.  Please advise  CB: [phone]

## 2021-12-04 NOTE — Telephone Encounter (Signed)
Pharmacy called and stated that they need you to send the accucheck strips rx again.  It did not come through in it's entirety

## 2021-12-06 ENCOUNTER — Other Ambulatory Visit: Payer: Self-pay

## 2021-12-06 DIAGNOSIS — E1169 Type 2 diabetes mellitus with other specified complication: Secondary | ICD-10-CM

## 2021-12-06 MED ORDER — GLUCOSE BLOOD VI STRP: ORAL_STRIP | 12 refills | Status: DC

## 2021-12-12 ENCOUNTER — Other Ambulatory Visit: Payer: Self-pay | Admitting: *Deleted

## 2021-12-12 MED ORDER — ACCU-CHEK GUIDE VI STRP: 1.0000 | ORAL_STRIP | Freq: Every day | 3 refills | Status: DC

## 2021-12-17 NOTE — Telephone Encounter (Signed)
Pt daughter called in and needs to speak with an assistant regarding glucose blood (ACCU-CHEK GUIDE) test strip.   States pharmacy states they need a CM form to be completed before they can fill it.   Please return call ASAP

## 2021-12-18 ENCOUNTER — Telehealth: Payer: Self-pay | Admitting: Internal Medicine

## 2021-12-18 MED ORDER — ACCU-CHEK GUIDE VI STRP: ORAL_STRIP | 3 refills | Status: DC

## 2021-12-18 NOTE — Telephone Encounter (Signed)
Pt daughter Kipp Laurence is requeting a callback about glucose blood (ACCU-CHEK GUIDE) test strip. She stated the insurance has advised her that they will not be covering rx.   Please advise.   CB: [phone]

## 2021-12-18 NOTE — Telephone Encounter (Signed)
Called pharmacy spoke w/ [place]. He states he did received the rx for accu-chek guide, but they are needing the CMN form. He states pt is using [employer] to fill script, and they contacted them to have form completed week ago. Inform Ben per chart MD has not received form. Will have them to refaxed to [phone]../l,mb

## 2021-12-18 NOTE — Addendum Note (Signed)
Addended by: Earnstine Regal on: 12/18/2021 11:44 AM   Modules accepted: Orders

## 2021-12-18 NOTE — Telephone Encounter (Signed)
Called Natalyia inform her its not that insurance will not cover. She need CMN form fill-out from provider. I inform her pharmacy this called morning (see prevous msg) he was going to have them send another form. Gave him fax# [phone] but have not received...Chryl Heck

## 2021-12-18 NOTE — Telephone Encounter (Signed)
Called pt back to see if she called [facility].[name] she wanted too know if she can do a 3 way. She called [facility] we spoke w/ Suezanne Jacquet (pharmacist) tried to explain to her we are waiting on the CMN form from Medicare. That is a form that's through [facility] so they cn ger covered. The daughter don't understand she is going on and on. Inform been as soon as we get form we will have another provider to fill out bcz Dr. Alain Marion is out until Monday 7/*31/23

## 2021-12-27 ENCOUNTER — Ambulatory Visit: Payer: Medicare Other | Admitting: Internal Medicine

## 2022-01-09 ENCOUNTER — Ambulatory Visit: Payer: Medicare Other | Admitting: Internal Medicine

## 2022-02-05 ENCOUNTER — Other Ambulatory Visit: Payer: Self-pay | Admitting: Internal Medicine

## 2022-02-06 ENCOUNTER — Ambulatory Visit (INDEPENDENT_AMBULATORY_CARE_PROVIDER_SITE_OTHER): Payer: Medicare Other

## 2022-02-06 ENCOUNTER — Encounter: Payer: Self-pay | Admitting: Internal Medicine

## 2022-02-06 ENCOUNTER — Ambulatory Visit (INDEPENDENT_AMBULATORY_CARE_PROVIDER_SITE_OTHER): Payer: Medicare Other | Admitting: Internal Medicine

## 2022-02-06 VITALS — BP 134/70 | HR 57 | Temp 97.8°F | Ht <= 58 in | Wt 157.6 lb

## 2022-02-06 DIAGNOSIS — R197 Diarrhea, unspecified: Secondary | ICD-10-CM | POA: Diagnosis not present

## 2022-02-06 DIAGNOSIS — J302 Other seasonal allergic rhinitis: Secondary | ICD-10-CM

## 2022-02-06 DIAGNOSIS — E1169 Type 2 diabetes mellitus with other specified complication: Secondary | ICD-10-CM | POA: Diagnosis not present

## 2022-02-06 DIAGNOSIS — R052 Subacute cough: Secondary | ICD-10-CM

## 2022-02-06 DIAGNOSIS — R059 Cough, unspecified: Secondary | ICD-10-CM | POA: Diagnosis not present

## 2022-02-06 DIAGNOSIS — J309 Allergic rhinitis, unspecified: Secondary | ICD-10-CM | POA: Insufficient documentation

## 2022-02-06 DIAGNOSIS — R140 Abdominal distension (gaseous): Secondary | ICD-10-CM

## 2022-02-06 MED ORDER — LORATADINE 10 MG PO TABS: 10.0000 mg | ORAL_TABLET | Freq: Every day | ORAL | 3 refills | Status: AC

## 2022-02-06 MED ORDER — RIFAXIMIN 550 MG PO TABS: 550.0000 mg | ORAL_TABLET | Freq: Three times a day (TID) | ORAL | 0 refills | Status: DC

## 2022-02-06 MED ORDER — METHYLPREDNISOLONE ACETATE 80 MG/ML IJ SUSP
80.0000 mg | Freq: Once | INTRAMUSCULAR | Status: AC
  Administered 2022-02-06: 80 mg via INTRAMUSCULAR

## 2022-02-06 MED ORDER — DIPHENOXYLATE-ATROPINE 2.5-0.025 MG PO TABS: 1.0000 | ORAL_TABLET | Freq: Four times a day (QID) | ORAL | 1 refills | Status: DC | PRN

## 2022-02-06 NOTE — Assessment & Plan Note (Addendum)
Treat rhinitis Smoker: will get a CXR. Try to stop smoking. Depo-medrol 80 mg IM Consider MDI

## 2022-02-06 NOTE — Assessment & Plan Note (Addendum)
Worse Try Xifaxan x 2 wks, Lomotil prn Try gluten free diet

## 2022-02-06 NOTE — Assessment & Plan Note (Signed)
Worse Claritin qd

## 2022-02-06 NOTE — Assessment & Plan Note (Addendum)
IBS-D - worse 6/23 - Flagyl Try Xifaxan x 2 wks, Lomotil prn Try gluten free diet

## 2022-02-06 NOTE — Patient Instructions (Signed)
  Gluten free trial for 4-6 weeks. OK to use gluten-free bread and gluten-free pasta.    Gluten-Free Diet for Celiac Disease, Adult The gluten-free diet includes all foods that do not contain gluten. Gluten is a protein that is found in wheat, rye, barley, and some other grains. Following the gluten-free diet is the only treatment for people with celiac disease. It helps to prevent damage to the intestines and improves or eliminates the symptoms of celiac disease. Following the gluten-free diet requires some planning. It can be challenging at first, but it gets easier with time and practice. There are more gluten-free options available today than ever before. If you need help finding gluten-free foods or if you have questions, talk with your diet and nutrition specialist (registered dietitian) or your health care provider. What do I need to know about a gluten-free diet?  All fruits, vegetables, and meats are safe to eat and do not contain gluten.  When grocery shopping, start by shopping in the produce, meat, and dairy sections. These sections are more likely to contain gluten-free foods. Then move to the aisles that contain packaged foods if you need to.  Read all food labels. Gluten is often added to foods. Always check the ingredient list and look for warnings, such as "may contain gluten."  Talk with your dietitian or health care provider before taking a gluten-free multivitamin or mineral supplement.  Be aware of gluten-free foods having contact with foods that contain gluten (cross-contamination). This can happen at home and with any processed foods. ? Talk with your health care provider or dietitian about how to reduce the risk of cross-contamination in your home. ? If you have questions about how a food is processed, ask the manufacturer. What key words help to identify gluten? Foods that list any of these key words on the label usually contain gluten:  Wheat, flour, enriched  flour, bromated flour, white flour, durum flour, graham flour, phosphated flour, self-rising flour, semolina, farina, barley (malt), rye, and oats.  Starch, dextrin, modified food starch, or cereal.  Thickening, fillers, or emulsifiers.  Malt flavoring, malt extract, or malt syrup.  Hydrolyzed vegetable protein.  In the [place] packaged foods that are gluten-free are required to be labeled "GF." These foods should be easy to identify and are safe to eat. In the [place] food companies are also required to list common food allergens, including wheat, on their labels. Recommended foods Grains  Amaranth, bean flours, 100% buckwheat flour, corn, millet, nut flours or nut meals, GF oats, quinoa, rice, sorghum, teff, rice wafers, pure cornmeal tortillas, popcorn, and hot cereals made from cornmeal. Hominy, rice, wild rice. Some Asian rice noodles or bean noodles. Arrowroot starch, corn bran, corn flour, corn germ, cornmeal, corn starch, potato flour, potato starch flour, and rice bran. Plain, brown, and sweet rice flours. Rice polish, soy flour, and tapioca starch. Vegetables  All plain fresh, frozen, and canned vegetables. Fruits  All plain fresh, frozen, canned, and dried fruits, and 100% fruit juices. Meats and other protein foods  All fresh beef, pork, poultry, fish, seafood, and eggs. Fish canned in water, oil, brine, or vegetable broth. Plain nuts and seeds, peanut butter. Some lunch meat and some frankfurters. Dried beans, dried peas, and lentils. Dairy  Fresh plain, dry, evaporated, or condensed milk. Cream, butter, sour cream, whipping cream, and most yogurts. Unprocessed cheese, most processed cheeses, some cottage cheese, some cream cheeses. Beverages  Coffee, tea, most herbal teas. Carbonated beverages and some root beers.   Wine, sake, and distilled spirits, such as gin, vodka, and whiskey. Most hard ciders. Fats and oils  Butter, margarine, vegetable oil, hydrogenated butter, olive  oil, shortening, lard, cream, and some mayonnaise. Some commercial salad dressings. Olives. Sweets and desserts  Sugar, honey, some syrups, molasses, jelly, and jam. Plain hard candy, marshmallows, and gumdrops. Pure cocoa powder. Plain chocolate. Custard and some pudding mixes. Gelatin desserts, sorbets, frozen ice pops, and sherbet. Cake, cookies, and other desserts prepared with allowed flours. Some commercial ice creams. Cornstarch, tapioca, and rice puddings. Seasoning and other foods  Some canned or frozen soups. Monosodium glutamate (MSG). Cider, rice, and wine vinegar. Baking soda and baking powder. Cream of tartar. Baking and nutritional yeast. Certain soy sauces made without wheat (ask your dietitian about specific brands that are allowed). Nuts, coconut, and chocolate. Salt, pepper, herbs, spices, flavoring extracts, imitation or artificial flavorings, natural flavorings, and food colorings. Some medicines and supplements. Some lip glosses and other cosmetics. Rice syrups. The items listed may not be a complete list. Talk with your dietitian about what dietary choices are best for you. Foods to avoid Grains  Barley, bran, bulgur, couscous, cracked wheat, [place], farro, graham, malt, matzo, semolina, wheat germ, and all wheat and rye cereals including spelt and kamut. Cereals containing malt as a flavoring, such as rice cereal. Noodles, spaghetti, macaroni, most packaged rice mixes, and all mixes containing wheat, rye, barley, or triticale. Vegetables  Most creamed vegetables and most vegetables canned in sauces. Some commercially prepared vegetables and salads. Fruits  Thickened or prepared fruits and some pie fillings. Some fruit snacks and fruit roll-ups. Meats and other protein foods  Any meat or meat alternative containing wheat, rye, barley, or gluten stabilizers. These are often marinated or packaged meats and lunch meats. Bread-containing products, such as Swiss steak,  croquettes, meatballs, and meatloaf. Most tuna canned in vegetable broth and turkey with hydrolyzed vegetable protein (HVP) injected as part of the basting. Seitan. Imitation fish. Eggs in sauces made from ingredients to avoid. Dairy  Commercial chocolate milk drinks and malted milk. Some non-dairy creamers. Any cheese product containing ingredients to avoid. Beverages  Certain cereal beverages. Beer, ale, malted milk, and some root beers. Some hard ciders. Some instant flavored coffees. Some herbal teas made with barley or with barley malt added. Fats and oils  Some commercial salad dressings. Sour cream containing modified food starch. Sweets and desserts  Some toffees. Chocolate-coated nuts (may be rolled in wheat flour) and some commercial candies and candy bars. Most cakes, cookies, donuts, pastries, and other baked goods. Some commercial ice cream. Ice cream cones. Commercially prepared mixes for cakes, cookies, and other desserts. Bread pudding and other puddings thickened with flour. Products containing brown rice syrup made with barley malt enzyme. Desserts and sweets made with malt flavoring. Seasoning and other foods  Some curry powders, some dry seasoning mixes, some gravy extracts, some meat sauces, some ketchups, some prepared mustards, and horseradish. Certain soy sauces. Malt vinegar. Bouillon and bouillon cubes that contain HVP. Some chip dips, and some chewing gum. Yeast extract. Brewer's yeast. Caramel color. Some medicines and supplements. Some lip glosses and other cosmetics. The items listed may not be a complete list. Talk with your dietitian about what dietary choices are best for you. Summary  Gluten is a protein that is found in wheat, rye, barley, and some other grains. The gluten-free diet includes all foods that do not contain gluten.  If you need help finding gluten-free foods or if   you have questions, talk with your diet and nutrition specialist (registered  dietitian) or your health care provider.  Read all food labels. Gluten is often added to foods. Always check the ingredient list and look for warnings, such as "may contain gluten." This information is not intended to replace advice given to you by your health care provider. Make sure you discuss any questions you have with your health care provider. Document Released: 05/13/2005 Document Revised: 02/26/2016 Document Reviewed: 02/26/2016 Elsevier Interactive Patient Education  2018 [employer] Inc[employer]   

## 2022-02-06 NOTE — Assessment & Plan Note (Signed)
Off Metformin

## 2022-02-06 NOTE — Progress Notes (Signed)
Subjective:  Patient ID: Susan Keith, female    DOB: 11/18/1945  Age: 76 y.o. MRN: [id number]  CC: Follow-up (6 week f/u)   HPI Susan Keith presents for diarrhea and gas every am C/o diarrhea 5 out of 7 days per week. Worse in am C/o cough x 2 weeks  Outpatient Medications Prior to Visit  Medication Sig Dispense Refill   amLODipine (NORVASC) 5 MG tablet TAKE 1 TABLET(5 MG) BY MOUTH DAILY 90 tablet 3   carvedilol (COREG) 3.125 MG tablet Take 1 tablet (3.125 mg total) by mouth 2 (two) times daily. 180 tablet 3   dicyclomine (BENTYL) 10 MG capsule Take 1 capsule (10 mg total) by mouth 3 (three) times daily before meals. 90 capsule 4   fluticasone (FLONASE) 50 MCG/ACT nasal spray Place 2 sprays into both nostrils daily.     glucose blood (ACCU-CHEK GUIDE) test strip Use to check blood sugars once a day 100 each 3   glucose blood test strip Use as instructed 100 each 12   levothyroxine (SYNTHROID) 88 MCG tablet Take 1 tablet (88 mcg total) by mouth daily. 90 tablet 3   pantoprazole (PROTONIX) 40 MG tablet Take 1 tablet (40 mg total) by mouth daily. 90 tablet 3   spironolactone (ALDACTONE) 25 MG tablet Take 1 tablet (25 mg total) by mouth daily. 90 tablet 3   repaglinide (PRANDIN) 1 MG tablet Take 1 tablet (1 mg total) by mouth 3 (three) times daily before meals. 90 tablet 11   repaglinide (PRANDIN) 0.5 MG tablet Take 0.5 mg by mouth 3 (three) times daily. Take 0.5 mg by mouth 3 (three) times daily.     acetaminophen (TYLENOL) 160 MG/5ML solution Take 15.6 mLs (500 mg total) by mouth every 6 (six) hours as needed. (Patient not taking: Reported on 02/06/2022) 120 mL 0   Cholecalciferol (VITAMIN D3) 5000 units CAPS Take 5,000 Units by mouth daily. (Patient not taking: Reported on 02/06/2022)     diphenoxylate-atropine (LOMOTIL) 2.5-0.025 MG tablet Take 1 tablet by mouth 4 (four) times daily as needed for diarrhea or loose stools. (Patient not taking: Reported on 02/06/2022) 60 tablet 0    rizatriptan (MAXALT) 10 MG tablet Take 1 tablet (10 mg total) by mouth once as needed for up to 1 dose for migraine. May repeat in 2 hours if needed (Patient not taking: Reported on 02/06/2022) 12 tablet 12   saccharomyces boulardii (FLORASTOR) 250 MG capsule Take 1 capsule (250 mg total) by mouth 2 (two) times daily. (Patient not taking: Reported on 02/06/2022) 60 capsule 1   senna-docusate (SENOKOT-S) 8.6-50 MG tablet Take 2 tablets by mouth daily as needed for moderate constipation. (Patient not taking: Reported on 02/06/2022) 100 tablet 6   No facility-administered medications prior to visit.    ROS: Review of Systems  Constitutional:  Negative for activity change, appetite change, chills, fatigue and unexpected weight change.  HENT:  Positive for postnasal drip. Negative for congestion, mouth sores and sinus pressure.   Eyes:  Negative for visual disturbance.  Respiratory:  Positive for cough. Negative for chest tightness.   Cardiovascular:  Negative for leg swelling.  Gastrointestinal:  Positive for abdominal distention and diarrhea. Negative for abdominal pain and nausea.  Genitourinary:  Negative for difficulty urinating, frequency and vaginal pain.  Musculoskeletal:  Negative for back pain and gait problem.  Skin:  Negative for pallor and rash.  Neurological:  Negative for dizziness, tremors, weakness, numbness and headaches.  Psychiatric/Behavioral:  Negative for confusion and sleep  disturbance.     Objective:  BP 134/70 (BP Location: Left Arm)   Pulse (!) 57   Temp 97.8 F (36.6 C) (Oral)   Ht '4\' 10"'$  (1.473 m)   Wt 157 lb 9.6 oz (71.5 kg)   SpO2 96%   BMI 32.94 kg/m   BP Readings from Last 3 Encounters:  02/06/22 134/70  11/28/21 140/78  10/29/21 110/70    Wt Readings from Last 3 Encounters:  02/06/22 157 lb 9.6 oz (71.5 kg)  11/28/21 140 lb (63.5 kg)  10/29/21 143 lb (64.9 kg)    Physical Exam Constitutional:      General: She is not in acute distress.     Appearance: She is well-developed.  HENT:     Head: Normocephalic.     Right Ear: External ear normal.     Left Ear: External ear normal.     Nose: Nose normal.  Eyes:     General:        Right eye: No discharge.        Left eye: No discharge.     Conjunctiva/sclera: Conjunctivae normal.     Pupils: Pupils are equal, round, and reactive to light.  Neck:     Thyroid: No thyromegaly.     Vascular: No JVD.     Trachea: No tracheal deviation.  Cardiovascular:     Rate and Rhythm: Normal rate and regular rhythm.     Heart sounds: Normal heart sounds.  Pulmonary:     Effort: No respiratory distress.     Breath sounds: No stridor. No wheezing.  Abdominal:     General: Bowel sounds are normal. There is no distension.     Palpations: Abdomen is soft. There is no mass.     Tenderness: There is no abdominal tenderness. There is no guarding or rebound.  Musculoskeletal:        General: No tenderness.     Cervical back: Normal range of motion and neck supple. No rigidity.  Lymphadenopathy:     Cervical: No cervical adenopathy.  Skin:    Findings: No erythema or rash.  Neurological:     Cranial Nerves: No cranial nerve deficit.     Motor: No abnormal muscle tone.     Coordination: Coordination normal.     Gait: Gait normal.     Deep Tendon Reflexes: Reflexes normal.  Psychiatric:        Behavior: Behavior normal.        Thought Content: Thought content normal.        Judgment: Judgment normal.     Lab Results  Component Value Date   WBC 7.3 11/28/2021   HGB 13.3 11/28/2021   HCT 40.8 11/28/2021   PLT 305.0 11/28/2021   GLUCOSE 92 11/28/2021   CHOL 216 (H) 03/18/2018   TRIG 125 03/18/2018   HDL 58 03/18/2018   LDLCALC 133 (H) 03/18/2018   ALT 11 11/28/2021   AST 16 11/28/2021   NA 141 11/28/2021   K 5.1 11/28/2021   CL 108 11/28/2021   CREATININE 0.86 11/28/2021   BUN 19 11/28/2021   CO2 27 11/28/2021   TSH 9.60 (H) 09/17/2021   HGBA1C 6.6 (H) 08/10/2021    DG  ESOPHAGUS W SINGLE CM (SOL OR THIN BA)  Result Date: 08/15/2021 CLINICAL DATA:  76 year old female with history of large hiatal hernia s/p Toupet fundoplication 7/74/81. EXAM: ESOPHAGUS/BARIUM SWALLOW/TABLET STUDY TECHNIQUE: Single contrast examination was performed using Omnipaque. This exam was performed by  Brynda Greathouse PA-C, and was supervised and interpreted by Nelson Chimes, MD. FLUOROSCOPY: Radiation Exposure Index (as provided by the fluoroscopic device): 8.7 mGy Kerma COMPARISON:  DG ESOPHAGUS 06/29/21 FINDINGS: Esophagus: Abrupt narrowing at the site of recent surgery with intermittent passing of contrast through the gastroesophageal junction. Esophageal motility: Within normal limits. Hiatal Hernia: None Gastroesophageal reflux: None visualized. Ingested 55m barium tablet: Not given Other: None. IMPRESSION: Narrowing of the gastroesophageal junction as expected after recent hiatal hernia repair with passage of contrast into the stomach. No post-operative leak identified. Electronically Signed   By: MNelson ChimesM.D.   On: 08/15/2021 11:07    Assessment & Plan:   Problem List Items Addressed This Visit     Abdominal bloating    Worse Try Xifaxan x 2 wks, Lomotil prn Try gluten free diet       Allergic rhinitis    Worse Claritin qd      Diabetes mellitus (HCC)    Off Metformin      Relevant Medications   repaglinide (PRANDIN) 0.5 MG tablet   Diarrhea    IBS-D - worse 6/23 - Flagyl Try Xifaxan x 2 wks, Lomotil prn Try gluten free diet       Subacute cough - Primary    Treat rhinitis Smoker: will get a CXR. Try to stop smoking. Depo-medrol 80 mg IM Consider MDI        Relevant Orders   DG Chest 2 View      Meds ordered this encounter  Medications   diphenoxylate-atropine (LOMOTIL) 2.5-0.025 MG tablet    Sig: Take 1-2 tablets by mouth 4 (four) times daily as needed for diarrhea or loose stools.    Dispense:  90 tablet    Refill:  1   rifaximin (XIFAXAN) 550  MG TABS tablet    Sig: Take 1 tablet (550 mg total) by mouth 3 (three) times daily.    Dispense:  42 tablet    Refill:  0    Failed Flagyl   loratadine (CLARITIN) 10 MG tablet    Sig: Take 1 tablet (10 mg total) by mouth daily.    Dispense:  90 tablet    Refill:  3      Follow-up: Return in about 4 weeks (around 03/06/2022) for a follow-up visit.  AWalker Kehr MD

## 2022-02-06 NOTE — Progress Notes (Cosign Needed Addendum)
Pls cosign for DEPO-MEDROL '80MG'$  inj../lmb   Medical screening examination/treatment/procedure(s) were performed by non-physician practitioner and as supervising physician I was immediately available for consultation/collaboration.  I agree with above. Lew Dawes, MD

## 2022-02-06 NOTE — Addendum Note (Signed)
Addended by: Earnstine Regal on: 02/06/2022 11:42 AM   Modules accepted: Orders

## 2022-02-07 DIAGNOSIS — I10 Essential (primary) hypertension: Secondary | ICD-10-CM | POA: Diagnosis not present

## 2022-02-07 DIAGNOSIS — R0609 Other forms of dyspnea: Secondary | ICD-10-CM | POA: Diagnosis not present

## 2022-02-09 ENCOUNTER — Other Ambulatory Visit: Payer: Self-pay | Admitting: Internal Medicine

## 2022-02-13 ENCOUNTER — Other Ambulatory Visit: Payer: Self-pay | Admitting: Internal Medicine

## 2022-02-28 ENCOUNTER — Other Ambulatory Visit: Payer: Self-pay | Admitting: Internal Medicine

## 2022-03-07 ENCOUNTER — Ambulatory Visit (INDEPENDENT_AMBULATORY_CARE_PROVIDER_SITE_OTHER): Payer: Medicare Other | Admitting: Internal Medicine

## 2022-03-07 ENCOUNTER — Encounter: Payer: Self-pay | Admitting: Internal Medicine

## 2022-03-07 DIAGNOSIS — E1169 Type 2 diabetes mellitus with other specified complication: Secondary | ICD-10-CM

## 2022-03-07 DIAGNOSIS — R7303 Prediabetes: Secondary | ICD-10-CM | POA: Diagnosis not present

## 2022-03-07 DIAGNOSIS — R110 Nausea: Secondary | ICD-10-CM

## 2022-03-07 DIAGNOSIS — R140 Abdominal distension (gaseous): Secondary | ICD-10-CM

## 2022-03-07 DIAGNOSIS — R197 Diarrhea, unspecified: Secondary | ICD-10-CM

## 2022-03-07 DIAGNOSIS — N183 Chronic kidney disease, stage 3 unspecified: Secondary | ICD-10-CM

## 2022-03-07 MED ORDER — PHENTERMINE HCL 37.5 MG PO TABS: 37.5000 mg | ORAL_TABLET | Freq: Every day | ORAL | 2 refills | Status: DC

## 2022-03-07 NOTE — Assessment & Plan Note (Signed)
Resolved after Xifaxan

## 2022-03-07 NOTE — Assessment & Plan Note (Addendum)
Try Phentermine Loose wt Try apple cider vinegar

## 2022-03-07 NOTE — Assessment & Plan Note (Signed)
Hydrate well 

## 2022-03-07 NOTE — Assessment & Plan Note (Signed)
Try Phentermine Loose wt

## 2022-03-07 NOTE — Assessment & Plan Note (Addendum)
?  gastroparesis Try fasting

## 2022-03-07 NOTE — Progress Notes (Signed)
Subjective:  Patient ID: Susan Keith, female    DOB: July 05, 1945  Age: 76 y.o. MRN: [id number]  CC: Follow-up (4 week f/u)   HPI Susan Keith presents for diarrhea - resolved, cough - resolved C/o chronic nausea and increased appetite  Outpatient Medications Prior to Visit  Medication Sig Dispense Refill   amLODipine (NORVASC) 5 MG tablet TAKE 1 TABLET(5 MG) BY MOUTH DAILY 90 tablet 3   carvedilol (COREG) 3.125 MG tablet Take 1 tablet (3.125 mg total) by mouth 2 (two) times daily. 180 tablet 3   dicyclomine (BENTYL) 10 MG capsule Take 1 capsule (10 mg total) by mouth 3 (three) times daily before meals. 90 capsule 4   fluticasone (FLONASE) 50 MCG/ACT nasal spray Place 2 sprays into both nostrils daily.     glucose blood (ACCU-CHEK GUIDE) test strip Use to check blood sugars once a day 100 each 3   glucose blood test strip Use as instructed 100 each 12   levothyroxine (SYNTHROID) 88 MCG tablet Take 1 tablet (88 mcg total) by mouth daily. 90 tablet 3   loratadine (CLARITIN) 10 MG tablet Take 1 tablet (10 mg total) by mouth daily. 90 tablet 3   pantoprazole (PROTONIX) 40 MG tablet TAKE 1 TABLET(40 MG) BY MOUTH TWICE DAILY 60 tablet 3   repaglinide (PRANDIN) 0.5 MG tablet TAKE 1 TABLET(0.5 MG) BY MOUTH THREE TIMES DAILY BEFORE MEALS 270 tablet 1   spironolactone (ALDACTONE) 25 MG tablet Take 1 tablet (25 mg total) by mouth daily. 90 tablet 3   rifaximin (XIFAXAN) 550 MG TABS tablet Take 1 tablet (550 mg total) by mouth 3 (three) times daily. 42 tablet 0   diphenoxylate-atropine (LOMOTIL) 2.5-0.025 MG tablet Take 1-2 tablets by mouth 4 (four) times daily as needed for diarrhea or loose stools. (Patient not taking: Reported on 03/07/2022) 90 tablet 1   No facility-administered medications prior to visit.    ROS: Review of Systems  Constitutional:  Positive for unexpected weight change. Negative for activity change, appetite change, chills and fatigue.  HENT:  Negative for congestion,  mouth sores and sinus pressure.   Eyes:  Negative for visual disturbance.  Respiratory:  Negative for cough and chest tightness.   Gastrointestinal:  Negative for abdominal pain and nausea.  Genitourinary:  Negative for difficulty urinating, frequency and vaginal pain.  Musculoskeletal:  Negative for back pain and gait problem.  Skin:  Negative for pallor and rash.  Neurological:  Negative for dizziness, tremors, weakness, numbness and headaches.  Psychiatric/Behavioral:  Negative for confusion, sleep disturbance and suicidal ideas.     Objective:  BP (!) 142/78 (BP Location: Left Arm)   Pulse 61   Temp 98.2 F (36.8 C) (Oral)   Ht '4\' 10"'$  (1.473 m)   Wt 159 lb 6.4 oz (72.3 kg)   SpO2 93%   BMI 33.31 kg/m   BP Readings from Last 3 Encounters:  03/07/22 (!) 142/78  02/06/22 134/70  11/28/21 140/78    Wt Readings from Last 3 Encounters:  03/07/22 159 lb 6.4 oz (72.3 kg)  02/06/22 157 lb 9.6 oz (71.5 kg)  11/28/21 140 lb (63.5 kg)    Physical Exam Constitutional:      General: She is not in acute distress.    Appearance: She is well-developed. She is obese.  HENT:     Head: Normocephalic.     Right Ear: External ear normal.     Left Ear: External ear normal.     Nose: Nose normal.  Eyes:     General:        Right eye: No discharge.        Left eye: No discharge.     Conjunctiva/sclera: Conjunctivae normal.     Pupils: Pupils are equal, round, and reactive to light.  Neck:     Thyroid: No thyromegaly.     Vascular: No JVD.     Trachea: No tracheal deviation.  Cardiovascular:     Rate and Rhythm: Normal rate and regular rhythm.     Heart sounds: Normal heart sounds.  Pulmonary:     Effort: No respiratory distress.     Breath sounds: No stridor. No wheezing.  Abdominal:     General: Bowel sounds are normal. There is no distension.     Palpations: Abdomen is soft. There is no mass.     Tenderness: There is no abdominal tenderness. There is no guarding or rebound.   Musculoskeletal:        General: No tenderness.     Cervical back: Normal range of motion and neck supple. No rigidity.  Lymphadenopathy:     Cervical: No cervical adenopathy.  Skin:    Findings: No erythema or rash.  Neurological:     Mental Status: She is oriented to person, place, and time.     Cranial Nerves: No cranial nerve deficit.     Motor: No abnormal muscle tone.     Coordination: Coordination normal.     Deep Tendon Reflexes: Reflexes normal.  Psychiatric:        Behavior: Behavior normal.        Thought Content: Thought content normal.        Judgment: Judgment normal.     Lab Results  Component Value Date   WBC 7.3 11/28/2021   HGB 13.3 11/28/2021   HCT 40.8 11/28/2021   PLT 305.0 11/28/2021   GLUCOSE 92 11/28/2021   CHOL 216 (H) 03/18/2018   TRIG 125 03/18/2018   HDL 58 03/18/2018   LDLCALC 133 (H) 03/18/2018   ALT 11 11/28/2021   AST 16 11/28/2021   NA 141 11/28/2021   K 5.1 11/28/2021   CL 108 11/28/2021   CREATININE 0.86 11/28/2021   BUN 19 11/28/2021   CO2 27 11/28/2021   TSH 9.60 (H) 09/17/2021   HGBA1C 6.6 (H) 08/10/2021    DG ESOPHAGUS W SINGLE CM (SOL OR THIN BA)  Result Date: 08/15/2021 CLINICAL DATA:  76 year old female with history of large hiatal hernia s/p Toupet fundoplication 01/24/50. EXAM: ESOPHAGUS/BARIUM SWALLOW/TABLET STUDY TECHNIQUE: Single contrast examination was performed using Omnipaque. This exam was performed by Brynda Greathouse PA-C, and was supervised and interpreted by Nelson Chimes, MD. FLUOROSCOPY: Radiation Exposure Index (as provided by the fluoroscopic device): 8.7 mGy Kerma COMPARISON:  DG ESOPHAGUS 06/29/21 FINDINGS: Esophagus: Abrupt narrowing at the site of recent surgery with intermittent passing of contrast through the gastroesophageal junction. Esophageal motility: Within normal limits. Hiatal Hernia: None Gastroesophageal reflux: None visualized. Ingested 1m barium tablet: Not given Other: None. IMPRESSION: Narrowing  of the gastroesophageal junction as expected after recent hiatal hernia repair with passage of contrast into the stomach. No post-operative leak identified. Electronically Signed   By: MNelson ChimesM.D.   On: 08/15/2021 11:07    Assessment & Plan:   Problem List Items Addressed This Visit     Abdominal bloating    Try Phentermine Loose wt      CRI (chronic renal insufficiency), stage 3 (moderate) (HCC)  Hydrate well      Diabetes mellitus ([place])    ?gastroparesis      Diarrhea    Resolved after Xifaxan      Nausea without vomiting    ?gastroparesis Try fasting      Prediabetes    Try Phentermine Loose wt Try apple cider vinegar         Meds ordered this encounter  Medications   phentermine (ADIPEX-P) 37.5 MG tablet    Sig: Take 1 tablet (37.5 mg total) by mouth daily before breakfast.    Dispense:  30 tablet    Refill:  2      Follow-up: Return in about 2 months (around 05/07/2022) for a follow-up visit.  Walker Kehr, MD

## 2022-03-07 NOTE — Patient Instructions (Addendum)
Try apple cider vinegar

## 2022-03-07 NOTE — Assessment & Plan Note (Signed)
gastroparesis

## 2022-03-18 DIAGNOSIS — H2513 Age-related nuclear cataract, bilateral: Secondary | ICD-10-CM | POA: Diagnosis not present

## 2022-03-18 DIAGNOSIS — H04123 Dry eye syndrome of bilateral lacrimal glands: Secondary | ICD-10-CM | POA: Diagnosis not present

## 2022-03-18 DIAGNOSIS — E119 Type 2 diabetes mellitus without complications: Secondary | ICD-10-CM | POA: Diagnosis not present

## 2022-03-18 LAB — HM DIABETES EYE EXAM

## 2022-03-19 ENCOUNTER — Encounter: Payer: Self-pay | Admitting: Internal Medicine

## 2022-05-07 ENCOUNTER — Ambulatory Visit (INDEPENDENT_AMBULATORY_CARE_PROVIDER_SITE_OTHER): Payer: Medicare Other | Admitting: Internal Medicine

## 2022-05-07 ENCOUNTER — Encounter: Payer: Self-pay | Admitting: Internal Medicine

## 2022-05-07 VITALS — BP 160/84 | HR 69 | Temp 97.9°F | Ht <= 58 in | Wt 166.9 lb

## 2022-05-07 DIAGNOSIS — R110 Nausea: Secondary | ICD-10-CM | POA: Diagnosis not present

## 2022-05-07 DIAGNOSIS — E1169 Type 2 diabetes mellitus with other specified complication: Secondary | ICD-10-CM

## 2022-05-07 DIAGNOSIS — N183 Chronic kidney disease, stage 3 unspecified: Secondary | ICD-10-CM | POA: Diagnosis not present

## 2022-05-07 LAB — COMPREHENSIVE METABOLIC PANEL
ALT: 15 U/L (ref 0–35)
AST: 18 U/L (ref 0–37)
Albumin: 4.5 g/dL (ref 3.5–5.2)
Alkaline Phosphatase: 116 U/L (ref 39–117)
BUN: 18 mg/dL (ref 6–23)
CO2: 31 mEq/L (ref 19–32)
Calcium: 10.1 mg/dL (ref 8.4–10.5)
Chloride: 102 mEq/L (ref 96–112)
Creatinine, Ser: 0.81 mg/dL (ref 0.40–1.20)
GFR: 71.65 mL/min (ref >60.00)
Glucose, Bld: 88 mg/dL (ref 70–99)
Potassium: 4.7 mEq/L (ref 3.5–5.1)
Sodium: 140 mEq/L (ref 135–145)
Total Bilirubin: 0.4 mg/dL (ref 0.2–1.2)
Total Protein: 7.7 g/dL (ref 6.0–8.3)

## 2022-05-07 LAB — HEMOGLOBIN A1C: Hgb A1c MFr Bld: 6.6 % — ABNORMAL HIGH (ref 4.6–6.5)

## 2022-05-07 MED ORDER — PHENTERMINE HCL 37.5 MG PO TABS: 37.5000 mg | ORAL_TABLET | Freq: Every day | ORAL | 2 refills | Status: DC

## 2022-05-07 MED ORDER — CARVEDILOL 6.25 MG PO TABS: 6.2500 mg | ORAL_TABLET | Freq: Two times a day (BID) | ORAL | 11 refills | Status: DC

## 2022-05-07 MED ORDER — METOCLOPRAMIDE HCL 5 MG PO TABS: 5.0000 mg | ORAL_TABLET | Freq: Three times a day (TID) | ORAL | 1 refills | Status: DC

## 2022-05-07 NOTE — Assessment & Plan Note (Addendum)
Refractory GI ref was placed Reglan trial - low dose

## 2022-05-07 NOTE — Assessment & Plan Note (Signed)
On Prandin to 1 mg with meals.

## 2022-05-07 NOTE — Progress Notes (Signed)
Subjective:  Patient ID: Susan Keith, female    DOB: [birth year]  Age: 76 y.o. MRN: [id number]  CC: Follow-up (2 month f/u)   HPI Catheleen Morioka presents for chronic nausea, indigestion, HTN  Outpatient Medications Prior to Visit  Medication Sig Dispense Refill   amLODipine (NORVASC) 5 MG tablet TAKE 1 TABLET(5 MG) BY MOUTH DAILY 90 tablet 3   fluticasone (FLONASE) 50 MCG/ACT nasal spray Place 2 sprays into both nostrils daily.     glucose blood (ACCU-CHEK GUIDE) test strip Use to check blood sugars once a day 100 each 3   glucose blood test strip Use as instructed 100 each 12   levothyroxine (SYNTHROID) 88 MCG tablet Take 1 tablet (88 mcg total) by mouth daily. 90 tablet 3   loratadine (CLARITIN) 10 MG tablet Take 1 tablet (10 mg total) by mouth daily. 90 tablet 3   pantoprazole (PROTONIX) 40 MG tablet TAKE 1 TABLET(40 MG) BY MOUTH TWICE DAILY 60 tablet 3   repaglinide (PRANDIN) 0.5 MG tablet TAKE 1 TABLET(0.5 MG) BY MOUTH THREE TIMES DAILY BEFORE MEALS 270 tablet 1   spironolactone (ALDACTONE) 25 MG tablet Take 1 tablet (25 mg total) by mouth daily. 90 tablet 3   carvedilol (COREG) 3.125 MG tablet Take 1 tablet (3.125 mg total) by mouth 2 (two) times daily. 180 tablet 3   dicyclomine (BENTYL) 10 MG capsule Take 1 capsule (10 mg total) by mouth 3 (three) times daily before meals. 90 capsule 4   phentermine (ADIPEX-P) 37.5 MG tablet Take 1 tablet (37.5 mg total) by mouth daily before breakfast. 30 tablet 2   No facility-administered medications prior to visit.    ROS: Review of Systems  Constitutional:  Positive for unexpected weight change. Negative for activity change, appetite change, chills and fatigue.  HENT:  Negative for congestion, mouth sores and sinus pressure.   Eyes:  Negative for visual disturbance.  Respiratory:  Negative for cough and chest tightness.   Gastrointestinal:  Positive for abdominal pain and nausea. Negative for vomiting.  Genitourinary:  Negative for  difficulty urinating, frequency and vaginal pain.  Musculoskeletal:  Negative for back pain and gait problem.  Skin:  Negative for color change, pallor and rash.  Neurological:  Negative for dizziness, tremors, weakness, numbness and headaches.  Psychiatric/Behavioral:  Negative for confusion and sleep disturbance. The patient is not nervous/anxious.     Objective:  BP (!) 160/84   Pulse 69   Temp 97.9 F (36.6 C) (Oral)   Ht '4\' 10"'$  (1.473 m)   Wt 166 lb 14.2 oz (75.7 kg)   SpO2 94%   BMI 34.88 kg/m   BP Readings from Last 3 Encounters:  05/07/22 (!) 160/84  03/07/22 (!) 142/78  02/06/22 134/70    Wt Readings from Last 3 Encounters:  05/07/22 166 lb 14.2 oz (75.7 kg)  03/07/22 159 lb 6.4 oz (72.3 kg)  02/06/22 157 lb 9.6 oz (71.5 kg)    Physical Exam Constitutional:      General: She is not in acute distress.    Appearance: She is well-developed.  HENT:     Head: Normocephalic.     Right Ear: External ear normal.     Left Ear: External ear normal.     Nose: Nose normal.  Eyes:     General:        Right eye: No discharge.        Left eye: No discharge.     Conjunctiva/sclera: Conjunctivae normal.  Pupils: Pupils are equal, round, and reactive to light.  Neck:     Thyroid: No thyromegaly.     Vascular: No JVD.     Trachea: No tracheal deviation.  Cardiovascular:     Rate and Rhythm: Normal rate and regular rhythm.     Heart sounds: Normal heart sounds.  Pulmonary:     Effort: No respiratory distress.     Breath sounds: No stridor. No wheezing.  Abdominal:     General: Bowel sounds are normal. There is no distension.     Palpations: Abdomen is soft. There is no mass.     Tenderness: There is no abdominal tenderness. There is no guarding or rebound.  Musculoskeletal:        General: No tenderness.     Cervical back: Normal range of motion and neck supple. No rigidity.  Lymphadenopathy:     Cervical: No cervical adenopathy.  Skin:    Findings: No  erythema or rash.  Neurological:     Cranial Nerves: No cranial nerve deficit.     Motor: No abnormal muscle tone.     Coordination: Coordination normal.     Deep Tendon Reflexes: Reflexes normal.  Psychiatric:        Behavior: Behavior normal.        Thought Content: Thought content normal.        Judgment: Judgment normal.     Lab Results  Component Value Date   WBC 7.3 11/28/2021   HGB 13.3 11/28/2021   HCT 40.8 11/28/2021   PLT 305.0 11/28/2021   GLUCOSE 92 11/28/2021   CHOL 216 (H) 03/18/2018   TRIG 125 03/18/2018   HDL 58 03/18/2018   LDLCALC 133 (H) 03/18/2018   ALT 11 11/28/2021   AST 16 11/28/2021   NA 141 11/28/2021   K 5.1 11/28/2021   CL 108 11/28/2021   CREATININE 0.86 11/28/2021   BUN 19 11/28/2021   CO2 27 11/28/2021   TSH 9.60 (H) 09/17/2021   HGBA1C 6.6 (H) 08/10/2021    DG ESOPHAGUS W SINGLE CM (SOL OR THIN BA)  Result Date: 08/15/2021 CLINICAL DATA:  76 year old female with history of large hiatal hernia s/p Toupet fundoplication 5/63/89. EXAM: ESOPHAGUS/BARIUM SWALLOW/TABLET STUDY TECHNIQUE: Single contrast examination was performed using Omnipaque. This exam was performed by Brynda Greathouse PA-C, and was supervised and interpreted by Nelson Chimes, MD. FLUOROSCOPY: Radiation Exposure Index (as provided by the fluoroscopic device): 8.7 mGy Kerma COMPARISON:  DG ESOPHAGUS 06/29/21 FINDINGS: Esophagus: Abrupt narrowing at the site of recent surgery with intermittent passing of contrast through the gastroesophageal junction. Esophageal motility: Within normal limits. Hiatal Hernia: None Gastroesophageal reflux: None visualized. Ingested 44m barium tablet: Not given Other: None. IMPRESSION: Narrowing of the gastroesophageal junction as expected after recent hiatal hernia repair with passage of contrast into the stomach. No post-operative leak identified. Electronically Signed   By: MNelson ChimesM.D.   On: 08/15/2021 11:07    Assessment & Plan:   Problem List  Items Addressed This Visit     Nausea without vomiting - Primary    Refractory GI ref was placed Reglan trial - low dose       Relevant Orders   Ambulatory referral to Gastroenterology   Comprehensive metabolic panel   Hemoglobin A1c   Diabetes mellitus ([place]    On Prandin to 1 mg with meals.      Relevant Orders   Comprehensive metabolic panel   Hemoglobin A1c   CRI (chronic renal  insufficiency), stage 3 (moderate) (HCC)    Labs  Hydrate well      Relevant Orders   Comprehensive metabolic panel   Hemoglobin A1c      Meds ordered this encounter  Medications   phentermine (ADIPEX-P) 37.5 MG tablet    Sig: Take 1 tablet (37.5 mg total) by mouth daily before breakfast.    Dispense:  30 tablet    Refill:  2   carvedilol (COREG) 6.25 MG tablet    Sig: Take 1 tablet (6.25 mg total) by mouth 2 (two) times daily with a meal.    Dispense:  60 tablet    Refill:  11   metoCLOPramide (REGLAN) 5 MG tablet    Sig: Take 1 tablet (5 mg total) by mouth 3 (three) times daily before meals.    Dispense:  90 tablet    Refill:  1      Follow-up: Return in about 3 months (around 08/06/2022) for a follow-up visit.  Walker Kehr, MD

## 2022-05-07 NOTE — Assessment & Plan Note (Addendum)
Labs  Hydrate well

## 2022-06-11 ENCOUNTER — Encounter: Payer: Self-pay | Admitting: Physician Assistant

## 2022-06-11 ENCOUNTER — Ambulatory Visit (INDEPENDENT_AMBULATORY_CARE_PROVIDER_SITE_OTHER): Payer: Medicare Other | Admitting: Physician Assistant

## 2022-06-11 VITALS — BP 140/94 | HR 57 | Ht 59.84 in | Wt 167.4 lb

## 2022-06-11 DIAGNOSIS — I10 Essential (primary) hypertension: Secondary | ICD-10-CM | POA: Diagnosis not present

## 2022-06-11 DIAGNOSIS — R112 Nausea with vomiting, unspecified: Secondary | ICD-10-CM

## 2022-06-11 DIAGNOSIS — K591 Functional diarrhea: Secondary | ICD-10-CM

## 2022-06-11 DIAGNOSIS — K219 Gastro-esophageal reflux disease without esophagitis: Secondary | ICD-10-CM

## 2022-06-11 DIAGNOSIS — E785 Hyperlipidemia, unspecified: Secondary | ICD-10-CM | POA: Insufficient documentation

## 2022-06-11 DIAGNOSIS — R0609 Other forms of dyspnea: Secondary | ICD-10-CM | POA: Diagnosis not present

## 2022-06-11 MED ORDER — ONDANSETRON HCL 4 MG PO TABS: 4.0000 mg | ORAL_TABLET | Freq: Four times a day (QID) | ORAL | 3 refills | Status: DC

## 2022-06-11 MED ORDER — OMEPRAZOLE 40 MG PO CPDR: 40.0000 mg | DELAYED_RELEASE_CAPSULE | Freq: Every day | ORAL | 11 refills | Status: DC

## 2022-06-11 NOTE — Progress Notes (Signed)
Subjective:    Patient ID: Susan Keith, female    DOB: [birth year], 77 y.o.   MRN: [id number]  HPI Susan Keith is a 77 year old white female, established with Dr. Henrene Pastor.  Patient is from the [place] and comes in today with an interpreter.  She was last seen in January 2023 here with complaints of chronic nausea dyspepsia and fullness.  She had a known paraesophageal hernia.  She underwent CT imaging after that visit in January which showed a very large hernia containing most of the stomach, otherwise negative study with the exception of stable uterine fibroids.  She was referred to surgery and underwent a hiatal hernia repair with toupee procedure with Dr. Rosendo Gros in March 2022.  She had barium swallow postoperatively that did show some narrowing at the site of surgery with intermittent passing of contrast. She says after the initial postop period she did well after surgery and actually feels a lot better.  She was no longer having epigastric pain, dysphagia or episodes of vomiting. She believes that she has stayed on Protonix 40 mg twice daily ever since that time but brings a bottle of omeprazole with her today and says that omeprazole generally works much better for her and is asking for a prescription.  She feels this helps with heartburn and indigestion. She has also been taking Reglan 3 times daily 5 mg.  Today she is complaining of frequent episodes of diarrhea and a lot of gassiness and rumbling in her abdomen.  She says sometimes she has completely watery stools, occasionally will have a normal bowel movement but more often having loose stools.  She still has some nausea though that not occurring nearly as frequently as prior to surgery. She has been trying to lose weight and has been doing intermittent fasting and says she is actually having less problems with diarrhea with her new diet.    Last colonoscopy was done in June 2020, she does have history of adenomatous colon polyps and will be due  for follow-up in 2025. Other medical problems include hypertension, migraine headaches, diabetes mellitus, and chronic kidney disease stage III  Review of Systems Pertinent positive and negative review of systems were noted in the above HPI section.  All other review of systems was otherwise negative.   Outpatient Encounter Medications as of 06/11/2022  Medication Sig   amLODipine (NORVASC) 5 MG tablet TAKE 1 TABLET(5 MG) BY MOUTH DAILY   carvedilol (COREG) 6.25 MG tablet Take 1 tablet (6.25 mg total) by mouth 2 (two) times daily with a meal.   fluticasone (FLONASE) 50 MCG/ACT nasal spray Place 2 sprays into both nostrils daily.   glucose blood (ACCU-CHEK GUIDE) test strip Use to check blood sugars once a day   glucose blood test strip Use as instructed   levothyroxine (SYNTHROID) 88 MCG tablet Take 1 tablet (88 mcg total) by mouth daily.   loratadine (CLARITIN) 10 MG tablet Take 1 tablet (10 mg total) by mouth daily.   metoCLOPramide (REGLAN) 5 MG tablet Take 1 tablet (5 mg total) by mouth 3 (three) times daily before meals.   omeprazole (PRILOSEC) 40 MG capsule Take 1 capsule (40 mg total) by mouth daily. Before breakfast   ondansetron (ZOFRAN) 4 MG tablet Take 1 tablet (4 mg total) by mouth every 6 (six) hours.   pantoprazole (PROTONIX) 40 MG tablet TAKE 1 TABLET(40 MG) BY MOUTH TWICE DAILY   repaglinide (PRANDIN) 0.5 MG tablet TAKE 1 TABLET(0.5 MG) BY MOUTH THREE TIMES DAILY BEFORE MEALS  spironolactone (ALDACTONE) 25 MG tablet Take 1 tablet (25 mg total) by mouth daily.   phentermine (ADIPEX-P) 37.5 MG tablet Take 1 tablet (37.5 mg total) by mouth daily before breakfast. (Patient not taking: Reported on 06/11/2022)   No facility-administered encounter medications on file as of 06/11/2022.   No Known Allergies Patient Active Problem List   Diagnosis Date Noted   Allergic rhinitis 02/06/2022   Subacute cough 02/06/2022   Migraines 11/28/2021   CRI (chronic renal insufficiency), stage 3  (moderate) (HCC) 11/28/2021   Diarrhea 10/29/2021   S/P Nissen fundoplication (without gastrostomy tube) procedure 08/14/2021   Snoring 01/18/2021   Tobacco use 01/18/2021   Chest tightness 10/17/2020   DOE (dyspnea on exertion) 10/17/2020   Degeneration of lumbar intervertebral disc 09/23/2019   Degenerative scoliosis 06/21/2019   Lumbar radiculopathy 06/21/2019   Diabetes mellitus ([place]) 06/07/2019   Hypercholesterolemia 06/07/2019   Left hip pain 03/24/2018   Prediabetes 05/02/2017   Thyroid disorder screen 05/02/2017   Abdominal bloating 02/04/2017   Cyst of left ovary 02/04/2017   Urge incontinence of urine 02/04/2017   Hyponatremia 01/07/2017   Constipation    Essential hypertension    Nausea without vomiting 12/29/2016   Right hip pain 09/23/2016   Back pain 09/09/2016   Colles' fracture of right radius, subsequent encounter for closed fracture with routine healing 06/13/2015   Social History   Socioeconomic History   Marital status: Married    Spouse name: Not on file   Number of children: 2   Years of education: Not on file   Highest education level: Not on file  Occupational History   Not on file  Tobacco Use   Smoking status: Every Day    Packs/day: 0.25    Years: 48.00    Total pack years: 12.00    Types: Cigarettes   Smokeless tobacco: Never  Vaping Use   Vaping Use: Never used  Substance and Sexual Activity   Alcohol use: No   Drug use: Never   Sexual activity: Not on file  Other Topics Concern   Not on file  Social History Narrative   Not on file   Social Determinants of Health   Financial Resource Strain: Not on file  Food Insecurity: Not on file  Transportation Needs: Not on file  Physical Activity: Not on file  Stress: Not on file  Social Connections: Not on file  Intimate Partner Violence: Not on file    Susan Keith's family history is not on file.      Objective:    Vitals:   06/11/22 1055  BP: (!) 140/94  Pulse: (!) 57     Physical Exam Well-developed well-nourished  eld WF , accompanied by interpreter-  in no acute distress.  Height, Weight, 32.8 BMI 140/94  HEENT; nontraumatic normocephalic, EOMI, PE R LA, sclera anicteric. Oropharynx; not examined today Neck; supple, no JVD Cardiovascular; regular rate and rhythm with S1-S2, no murmur rub or gallop Pulmonary; Clear bilaterally Abdomen; soft, nontender, nondistended, no palpable mass or hepatosplenomegaly, bowel sounds are active Rectal; not done today Skin; benign exam, no jaundice rash or appreciable lesions Extremities; no clubbing cyanosis or edema skin warm and dry Neuro/Psych; alert and oriented x4, grossly nonfocal mood and affect appropriate        Assessment & Plan:   #36 77 year old white female with history of large paraesophageal hernia status post repair with toupee procedure in March 2022. Patient has had significant and improvement in a lot of her  GI symptoms since surgery, less nausea no complaints of epigastric pain dysphagia or vomiting.  She still experiencing intermittent heartburn.  She had been on twice daily Protonix, patient relates that omeprazole generally works better for her.  #2 nausea-had been a chronic issue in the past as well as dyspepsia.  She has a prescription for metoclopramide 5 mg 3 times daily. This may be causing some issues with rumbling in the abdomen and loose stools, not sure that she needs this at this point.  #3 frequent episodes of loose stools-suspect medication induced i.e. metoclopramide versus IBS #4 history of adenomatous colon polyps-up-to-date with colonoscopy due for follow-up 2025 #5 hypertension 6.  Migraine headaches 7.  Diabetes mellitus 8.  Chronic kidney disease stage III  Plan; stop Protonix Start omeprazole 40 mg 1 p.o. every morning AC breakfast-refill x 1 year Stop metoclopramide Patient was given a prescription for Zofran 4 mg every 6 to 8 hours to use on a as needed basis for  nausea. If she does not have any improvement in the episodes of loose stools I have asked her to make a follow-up appointment in 6 to 8 weeks.   Susan Keith S Destiny Hagin PA-C 06/11/2022   Cc: Plotnikov, Evie Lacks, MD

## 2022-06-11 NOTE — Patient Instructions (Signed)
We have sent the following medications to your pharmacy for you to pick up at your convenience: omeprazole and zofran.  Stop taking Protonix and Reglan.  Please contact our office and schedule a follow up visit if these medications do not help.  The [facility] providers would like to encourage you to use [facility] to communicate with providers for non-urgent requests or questions.  Due to long hold times on the telephone, sending your provider a message by [facility] may be a faster and more efficient way to get a response.  Please allow 48 business hours for a response.  Please remember that this is for non-urgent requests.

## 2022-06-11 NOTE — Progress Notes (Signed)
Noted  

## 2022-06-14 ENCOUNTER — Other Ambulatory Visit: Payer: Self-pay | Admitting: Internal Medicine

## 2022-07-27 IMAGING — RF DG ESOPHAGUS
11 of 12 series · 14 of 24 positions shown · non-contrast
Comparison: November 18, 2018.

CLINICAL DATA: Hiatal hernia.  Dysphagia.

EXAM:
ESOPHAGUS/BARIUM SWALLOW/TABLET STUDY
TECHNIQUE: Single contrast examination was performed using thin liquid barium.
This exam was performed by Benrabah Etoil PA, and was supervised and
interpreted by Dr. Esvin.
FLUOROSCOPY TIME:  Radiation Exposure Index (as provided by the
fluoroscopic device): 18.4 mGy

[Series 1: cp_standard · 1 of 154 frames shown (1 of 7)]
[frame 11/154]
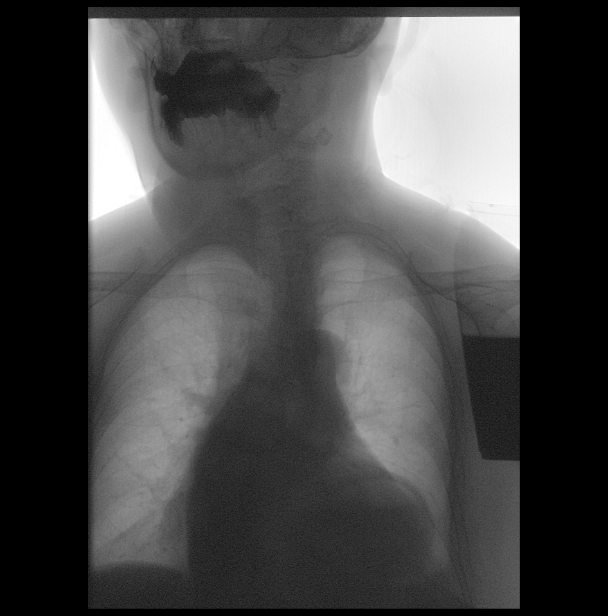

[Series 2: fluoro_barium 2fps_bw · 1 of 3 frames shown (1 of 4)]
[frame 1/3]
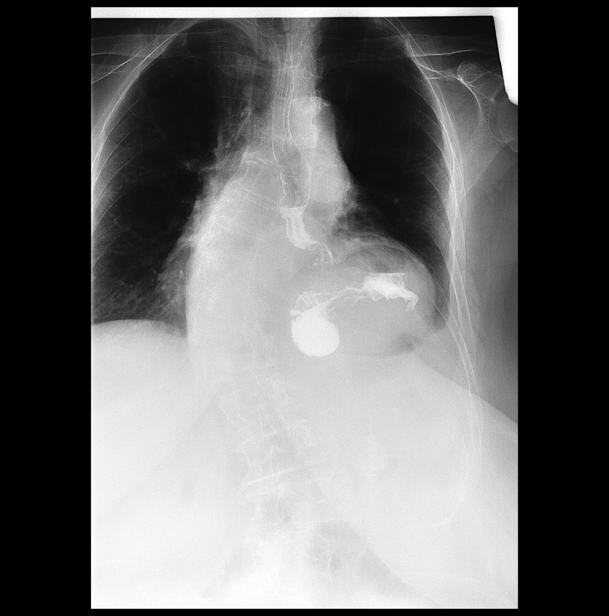

[Series 3: cp_standard · 1 of 175 frames shown (2 of 7)]
[frame 27/175]
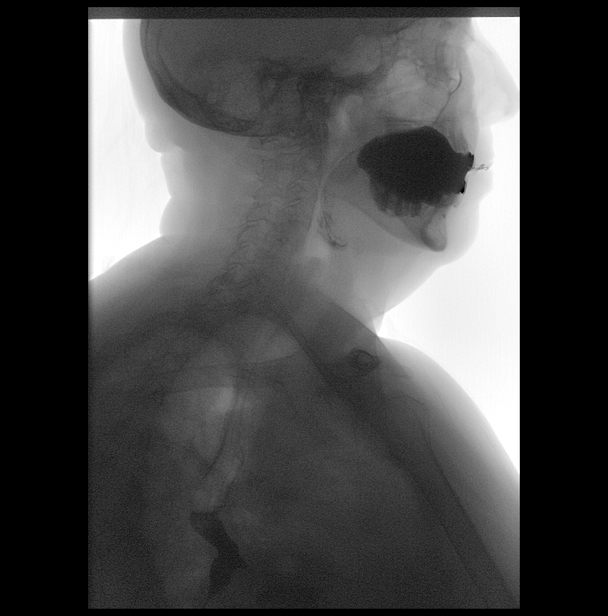

[Series 4: cp_standard · 2 of 108 frames shown (3 of 7)]
[frame 17/108]
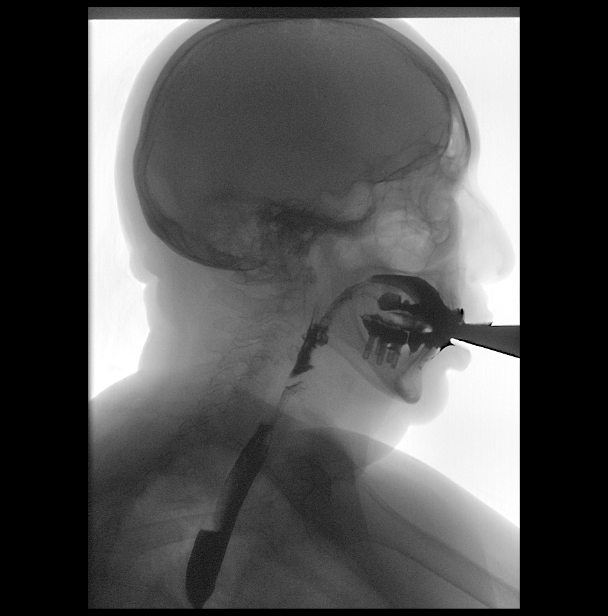
[frame 55/108]
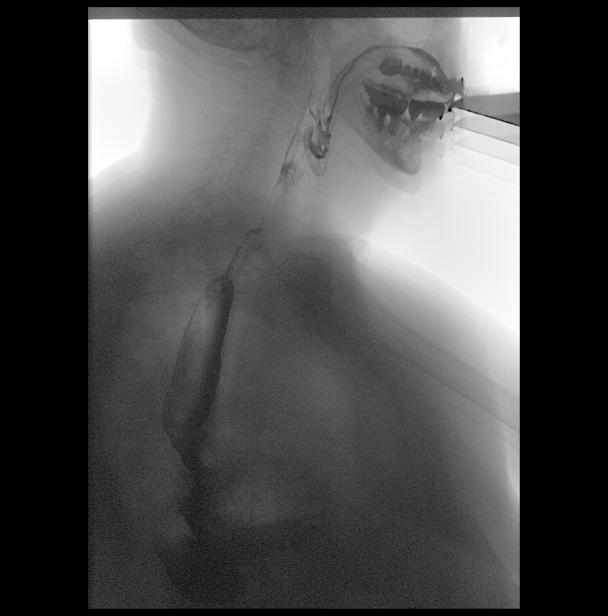

[Series 5: fluoro_barium 2fps_bw · 1 of 9 frames shown (2 of 4)]
[frame 5/9]
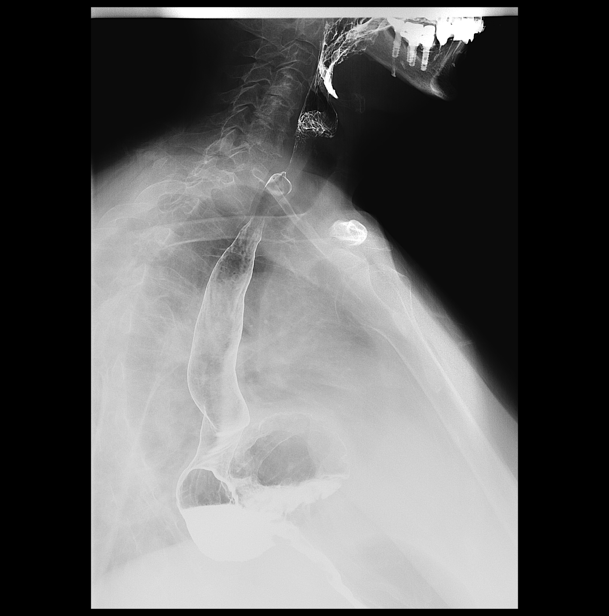

[Series 7: cp_standard · 2 of 140 frames shown (4 of 7)]
[frame 14/140]
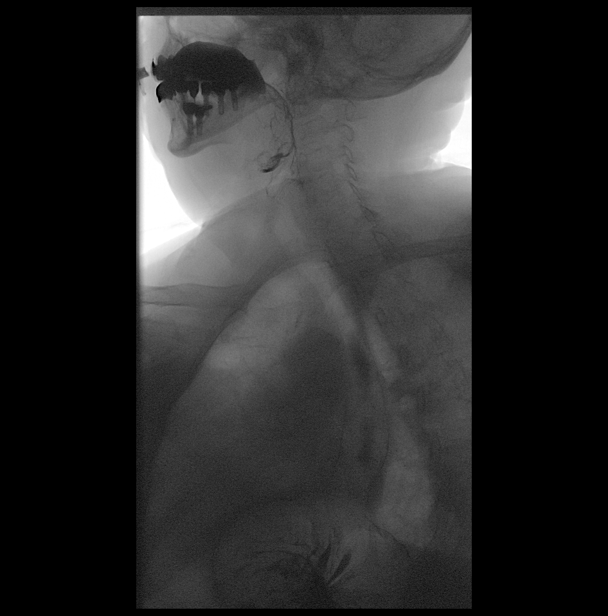
[frame 22/140]
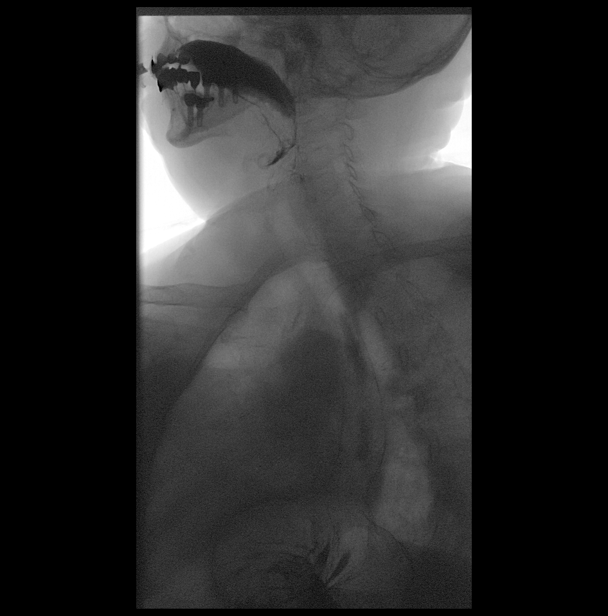

[Series 8: fluoro_barium 2fps_bw · 1 of 8 frames shown (3 of 4)]
[frame 2/8]
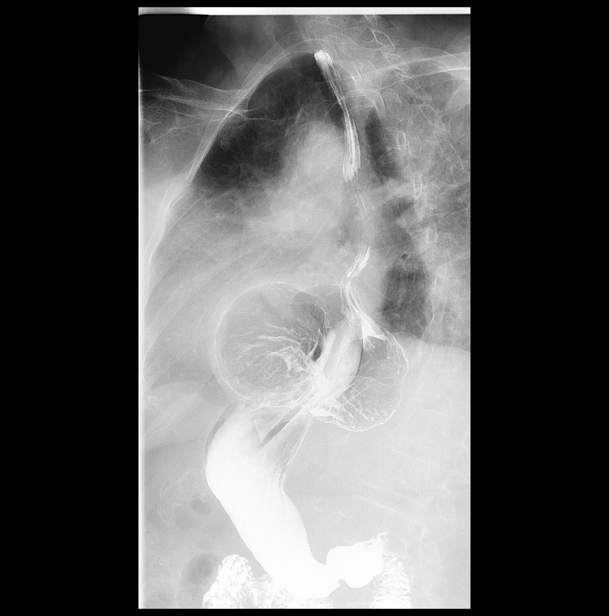

[Series 9: cp_standard · 1 of 241 frames shown (5 of 7)]
[frame 37/241]
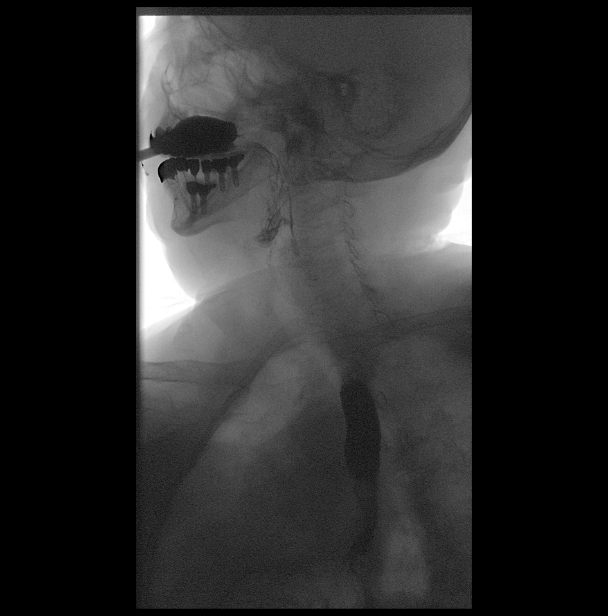

[Series 10: fluoro_barium 2fps_bw · 1 of 6 frames shown (4 of 4)]
[frame 4/6]
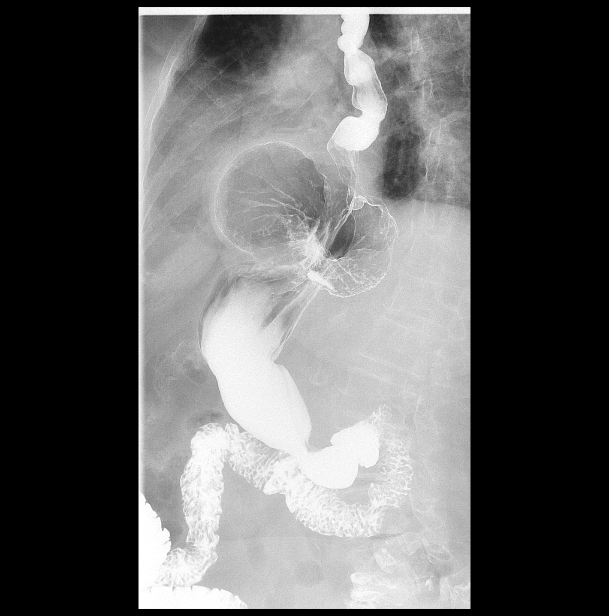

[Series 11: cp_standard · 2 of 51 frames shown (6 of 7)]
[frame 4/51]
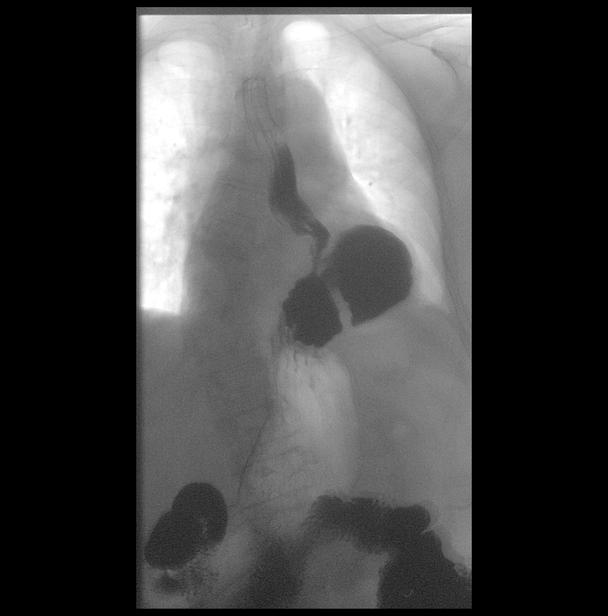
[frame 44/51]
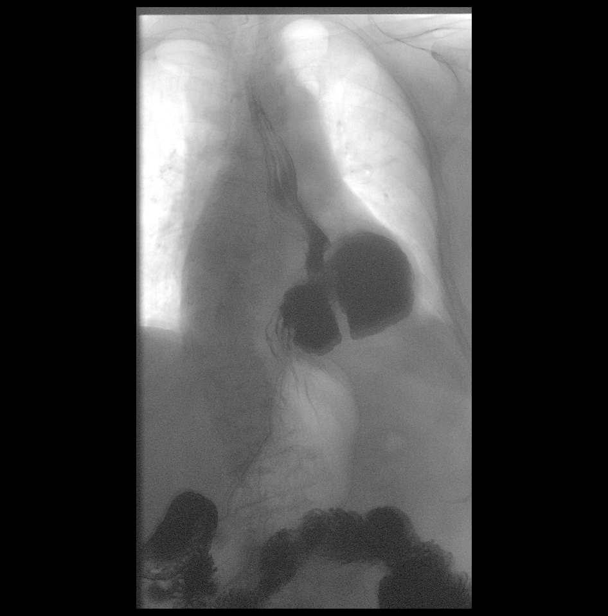

[Series 12: cp_standard · 1 of 116 frames shown (7 of 7)]
[frame 99/116]
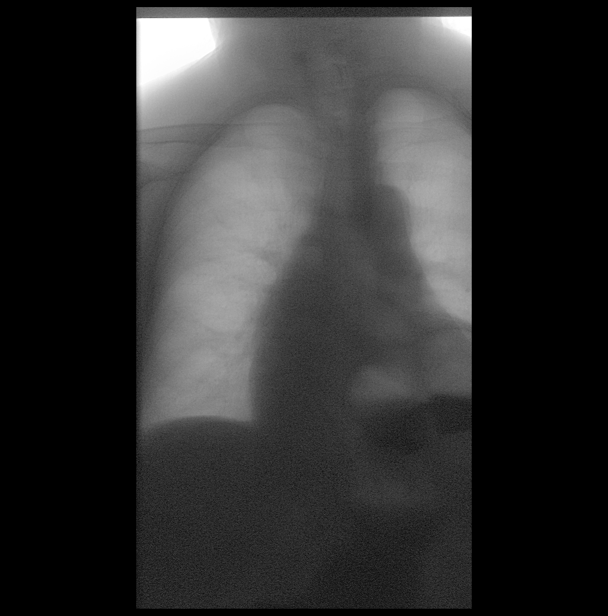

[14 of 24 positions shown; findings below may reference images not displayed]

FINDINGS: Swallowing: Appears normal. No vestibular penetration or aspiration
seen.

Pharynx: Unremarkable.

Esophagus: Normal appearance.

Esophageal motility: Within normal limits.

Hiatal Hernia: Large hiatal hernia is noted. Gastroesophageal
junction is above the diaphragm.

Gastroesophageal reflux: Mild amount of reflux is noted.

Ingested 13mm barium tablet: Passed normally.

Other: None.
IMPRESSION: Large hiatal hernia is noted. Mild amount of gastroesophageal reflux
is noted.

## 2022-07-31 ENCOUNTER — Other Ambulatory Visit: Payer: Self-pay | Admitting: Internal Medicine

## 2022-08-06 ENCOUNTER — Ambulatory Visit: Payer: Medicare Other | Admitting: Internal Medicine

## 2022-08-12 ENCOUNTER — Emergency Department (HOSPITAL_COMMUNITY)
Admission: EM | Admit: 2022-08-12 | Discharge: 2022-08-13 | Disposition: A | Discharge status: Home / Self Care | Payer: Medicare Other | Attending: Emergency Medicine | Admitting: Emergency Medicine

## 2022-08-12 DIAGNOSIS — R109 Unspecified abdominal pain: Secondary | ICD-10-CM | POA: Insufficient documentation

## 2022-08-12 DIAGNOSIS — Z20822 Contact with and (suspected) exposure to covid-19: Secondary | ICD-10-CM | POA: Diagnosis not present

## 2022-08-12 DIAGNOSIS — R739 Hyperglycemia, unspecified: Secondary | ICD-10-CM | POA: Diagnosis not present

## 2022-08-12 DIAGNOSIS — I6522 Occlusion and stenosis of left carotid artery: Secondary | ICD-10-CM

## 2022-08-12 DIAGNOSIS — R42 Dizziness and giddiness: Secondary | ICD-10-CM

## 2022-08-12 DIAGNOSIS — E860 Dehydration: Secondary | ICD-10-CM | POA: Diagnosis not present

## 2022-08-12 DIAGNOSIS — I6523 Occlusion and stenosis of bilateral carotid arteries: Secondary | ICD-10-CM | POA: Diagnosis not present

## 2022-08-12 DIAGNOSIS — N281 Cyst of kidney, acquired: Secondary | ICD-10-CM | POA: Diagnosis not present

## 2022-08-12 DIAGNOSIS — I771 Stricture of artery: Secondary | ICD-10-CM | POA: Diagnosis not present

## 2022-08-12 DIAGNOSIS — Z7982 Long term (current) use of aspirin: Secondary | ICD-10-CM | POA: Insufficient documentation

## 2022-08-12 NOTE — ED Triage Notes (Signed)
Patient arrived with family who states she is complaining of dizziness and dry mouth that started tonight. Reports she is a diabetic and her blood sugar was elevated. Declines any pain.

## 2022-08-13 ENCOUNTER — Emergency Department (HOSPITAL_COMMUNITY): Payer: Medicare Other

## 2022-08-13 ENCOUNTER — Encounter (HOSPITAL_COMMUNITY): Payer: Self-pay

## 2022-08-13 DIAGNOSIS — E860 Dehydration: Secondary | ICD-10-CM | POA: Diagnosis not present

## 2022-08-13 DIAGNOSIS — I771 Stricture of artery: Secondary | ICD-10-CM | POA: Diagnosis not present

## 2022-08-13 DIAGNOSIS — R42 Dizziness and giddiness: Secondary | ICD-10-CM | POA: Diagnosis not present

## 2022-08-13 DIAGNOSIS — I6523 Occlusion and stenosis of bilateral carotid arteries: Secondary | ICD-10-CM | POA: Diagnosis not present

## 2022-08-13 DIAGNOSIS — N281 Cyst of kidney, acquired: Secondary | ICD-10-CM | POA: Diagnosis not present

## 2022-08-13 DIAGNOSIS — R109 Unspecified abdominal pain: Secondary | ICD-10-CM | POA: Diagnosis not present

## 2022-08-13 DIAGNOSIS — R739 Hyperglycemia, unspecified: Secondary | ICD-10-CM | POA: Diagnosis not present

## 2022-08-13 LAB — URINALYSIS, ROUTINE W REFLEX MICROSCOPIC
Bilirubin Urine: NEGATIVE
Glucose, UA: NEGATIVE mg/dL
Hgb urine dipstick: NEGATIVE
Ketones, ur: NEGATIVE mg/dL
Leukocytes,Ua: NEGATIVE
Nitrite: NEGATIVE
Protein, ur: NEGATIVE mg/dL
Specific Gravity, Urine: 1.02 (ref 1.005–1.030)
pH: 7 (ref 5.0–8.0)

## 2022-08-13 LAB — BASIC METABOLIC PANEL
Anion gap: 6 (ref 5–15)
BUN: 25 mg/dL — ABNORMAL HIGH (ref 8–23)
CO2: 25 mmol/L (ref 22–32)
Calcium: 8.5 mg/dL — ABNORMAL LOW (ref 8.9–10.3)
Chloride: 106 mmol/L (ref 98–111)
Creatinine, Ser: 0.94 mg/dL (ref 0.44–1.00)
GFR, Estimated: >60 mL/min (ref >60)
Glucose, Bld: 198 mg/dL — ABNORMAL HIGH (ref 70–99)
Potassium: 3.6 mmol/L (ref 3.5–5.1)
Sodium: 137 mmol/L (ref 135–145)

## 2022-08-13 LAB — CBC
HCT: 42.9 % (ref 36.0–46.0)
Hemoglobin: 13.4 g/dL (ref 12.0–15.0)
MCH: 28.5 pg (ref 26.0–34.0)
MCHC: 31.2 g/dL (ref 30.0–36.0)
MCV: 91.1 fL (ref 80.0–100.0)
Platelets: 218 10*3/uL (ref 150–400)
RBC: 4.71 MIL/uL (ref 3.87–5.11)
RDW: 14 % (ref 11.5–15.5)
WBC: 10.2 10*3/uL (ref 4.0–10.5)
nRBC: 0 % (ref 0.0–0.2)

## 2022-08-13 LAB — RESP PANEL BY RT-PCR (RSV, FLU A&B, COVID)  RVPGX2
Influenza A by PCR: NEGATIVE
Influenza B by PCR: NEGATIVE
Resp Syncytial Virus by PCR: NEGATIVE
SARS Coronavirus 2 by RT PCR: NEGATIVE

## 2022-08-13 LAB — CBG MONITORING, ED: Glucose-Capillary: 194 mg/dL — ABNORMAL HIGH (ref 70–99)

## 2022-08-13 MED ORDER — IOHEXOL 350 MG/ML SOLN
75.0000 mL | Freq: Once | INTRAVENOUS | Status: AC | PRN
  Administered 2022-08-13: 75 mL via INTRAVENOUS

## 2022-08-13 MED ORDER — ASPIRIN 81 MG PO CHEW: 81.0000 mg | CHEWABLE_TABLET | Freq: Every day | ORAL | 0 refills | Status: AC

## 2022-08-13 MED ORDER — SODIUM CHLORIDE (PF) 0.9 % IJ SOLN
INTRAMUSCULAR | Status: AC
  Filled 2022-08-13: qty 50

## 2022-08-13 MED ORDER — LACTATED RINGERS IV BOLUS
1000.0000 mL | Freq: Once | INTRAVENOUS | Status: AC
  Administered 2022-08-13: 1000 mL via INTRAVENOUS

## 2022-08-13 MED ORDER — ONDANSETRON HCL 4 MG/2ML IJ SOLN
4.0000 mg | Freq: Once | INTRAMUSCULAR | Status: AC
  Administered 2022-08-13: 4 mg via INTRAVENOUS
  Filled 2022-08-13: qty 2

## 2022-08-13 NOTE — Discharge Instructions (Addendum)
Your CTA revealed narrowing of your left internal carotid for which you need to follow-up with vascular surgery.    We will start you on a baby aspirin.  Additionally follow-up with neurology.  There was an incidental finding of a possible mass versus a lymph node and follow-up with ENT was recommended as well. Call your PCP to discuss and coordinate outpatient care. Return to the ER for any strokelike symptoms. Your MRI was negative for stroke today.  ??? ??????? ??????? ????? ?????????? ?????? ???????, ? ????? ? ??? ??? ?????????? ???????? ?????????? ????????. ?? ?????? ??? ?????? ??????? ???????. ????????????? ?????????? ? ?????????. ???????? ???? ?????????? ????????? ??????? ? ????????????? ????, ? ???? ????????????? ?????????? ????. ????????? ?????? PCP, ????? ???????? ? ??????????????? ???????????? ???????. ????????? ? ????????? ?????????? ?????? ??? ????? ?????????, ??????? ?? ???????. ???? ??? ??????? ???? ????????????? ????????? ?? ???????.  CTA Head and Neck Results: IMPRESSION:  1. Negative for large vessel occlusion. No acute intracranial  abnormality by CT. Advanced cerebral white matter changes with  heterogeneity in the deep gray nuclei compatible with small vessel  disease.    2. Positive for bulky calcified plaque at the Left ICA bulb with  High-grade stenosis numerically estimated at 75%, approaching a  RADIOGRAPHIC STRING SIGN.    3. Superimposed heavily calcified Bilateral ICA siphons. Moderate  left and moderate to severe right cavernous and supraclinoid segment  stenoses. Other atherosclerosis at the distal basilar artery, left  PCA, left A1 and M1 without significant stenosis.    4. In the right Neck there is a maximal right level 1 B lymph node  versus 10 mm mass of the right submandibular gland. Cannot exclude a  small salivary gland neoplasm (benign or malignant). Recommend  follow-up with ENT and routine protocol Neck CT with IV contrast.    5. Retained secretions  in the trachea at the thoracic inlet. Aortic  Atherosclerosis (ICD10-I70.0).    Your MRI Results: IMPRESSION:  1. No acute intracranial abnormality.  Advanced signal changes in the bilateral cerebral white matter and  deep gray nuclei, nonspecific but most commonly due to chronic small  vessel disease.    2. Right superior submandibular gland region T1 hypointense nodule  corresponding to the CTA finding. It remains unclear whether this is  within the gland or a small adjacent lymph node. Follow up  recommendations as per the CTA.    3. Left mastoid air cell effusion, likely postinflammatory and  significance doubtful.

## 2022-08-13 NOTE — ED Notes (Signed)
Readjusted purwick and repositioned pt for more comfort

## 2022-08-13 NOTE — ED Provider Notes (Signed)
[place] EMERGENCY DEPARTMENT AT [facility] Provider Note   CSN: [id number] Arrival date & time: 08/12/22  2350     History  Chief Complaint  Patient presents with   Dizziness    Susan Keith is a 77 y.o. female.   Dizziness    77 year old female presenting to the emergency department with a chief complaint of dizziness and dry mouth.  The patient was bedside inpatient as her husband is in the hospital currently.  She developed sudden onset lightheadedness at around 11:00 tonight.  Initially described as room spinning dizziness, on further elaboration was described as a lightheadedness.  She feels dehydrated and has a dry mouth.  No syncopal episode.  Symptoms have since eased off since coming to the emergency department.  Mild confusion associated with her symptoms.  No facial droop, difficulty speaking or swallowing, numbness or focal weakness.  She is able to ambulate. She denies any headaches, fevers or chills. No vision changes.  Home Medications Prior to Admission medications   Medication Sig Start Date End Date Taking? Authorizing Provider  aspirin 81 MG chewable tablet Chew 1 tablet (81 mg total) by mouth daily. 08/13/22 11/11/22 Yes Regan Lemming, MD  amLODipine (NORVASC) 5 MG tablet TAKE 1 TABLET(5 MG) BY MOUTH DAILY 09/17/21   Plotnikov, Evie Lacks, MD  carvedilol (COREG) 6.25 MG tablet Take 1 tablet (6.25 mg total) by mouth 2 (two) times daily with a meal. 05/07/22   Plotnikov, Evie Lacks, MD  fluticasone (FLONASE) 50 MCG/ACT nasal spray Place 2 sprays into both nostrils daily.    [provider]  glucose blood (ACCU-CHEK GUIDE) test strip Use to check blood sugars once a day 12/18/21   Plotnikov, Evie Lacks, MD  glucose blood test strip Use as instructed 12/06/21   Plotnikov, Evie Lacks, MD  levothyroxine (SYNTHROID) 88 MCG tablet Take 1 tablet (88 mcg total) by mouth daily. 09/18/21   Plotnikov, Evie Lacks, MD  loratadine (CLARITIN) 10 MG tablet Take 1  tablet (10 mg total) by mouth daily. 02/06/22   Plotnikov, Evie Lacks, MD  metoCLOPramide (REGLAN) 5 MG tablet Take 1 tablet (5 mg total) by mouth 3 (three) times daily before meals. 05/07/22 05/07/23  Plotnikov, Evie Lacks, MD  omeprazole (PRILOSEC) 40 MG capsule Take 1 capsule (40 mg total) by mouth daily. Before breakfast 06/11/22   Esterwood, Amy S, PA-C  ondansetron (ZOFRAN) 4 MG tablet Take 1 tablet (4 mg total) by mouth every 6 (six) hours. 06/11/22   Esterwood, Amy S, PA-C  pantoprazole (PROTONIX) 40 MG tablet TAKE 1 TABLET(40 MG) BY MOUTH TWICE DAILY 07/31/22   Irene Shipper, MD  phentermine (ADIPEX-P) 37.5 MG tablet Take 1 tablet (37.5 mg total) by mouth daily before breakfast. Patient not taking: Reported on 06/11/2022 05/07/22 06/06/22  Plotnikov, Evie Lacks, MD  repaglinide (PRANDIN) 0.5 MG tablet TAKE 1 TABLET(0.5 MG) BY MOUTH THREE TIMES DAILY BEFORE MEALS 02/13/22   Plotnikov, Evie Lacks, MD  spironolactone (ALDACTONE) 25 MG tablet Take 1 tablet (25 mg total) by mouth daily. 09/17/21   Plotnikov, Evie Lacks, MD      Allergies    Patient has no known allergies.    Review of Systems   Review of Systems  Neurological:  Positive for dizziness and light-headedness.  All other systems reviewed and are negative.   Physical Exam Updated Vital Signs BP (!) 157/70 (BP Location: Right Arm)   Pulse 62   Temp 97.8 F (36.6 C) (Oral)   Resp 16  Ht 4' 11.84" (1.52 m)   Wt 75.9 kg   SpO2 94%   BMI 32.86 kg/m  Physical Exam Vitals and nursing note reviewed.  Constitutional:      General: She is not in acute distress.    Appearance: She is well-developed.  HENT:     Head: Normocephalic and atraumatic.     Mouth/Throat:     Mouth: Mucous membranes are dry.  Eyes:     Conjunctiva/sclera: Conjunctivae normal.  Cardiovascular:     Rate and Rhythm: Normal rate and regular rhythm.     Heart sounds: No murmur heard. Pulmonary:     Effort: Pulmonary effort is normal. No respiratory distress.      Breath sounds: Normal breath sounds.  Abdominal:     Palpations: Abdomen is soft.     Tenderness: There is no abdominal tenderness.  Musculoskeletal:        General: No swelling.     Cervical back: Neck supple.  Skin:    General: Skin is warm and dry.     Capillary Refill: Capillary refill takes less than 2 seconds.  Neurological:     Mental Status: She is alert.     Comments: MENTAL STATUS EXAM:    Orientation: Alert and oriented to person, place and time.  Memory: Cooperative, follows commands well.  Language: Speech is clear and language is normal.   CRANIAL NERVES:    CN 2 (Optic): Visual fields intact to confrontation.  CN 3,4,6 (EOM): Pupils equal and reactive to light. Full extraocular eye movement without nystagmus.  CN 5 (Trigeminal): Facial sensation is normal, no weakness of masticatory muscles.  CN 7 (Facial): No facial weakness or asymmetry.  CN 8 (Auditory): Auditory acuity grossly normal.  CN 9,10 (Glossophar): The uvula is midline, the palate elevates symmetrically.  CN 11 (spinal access): Normal sternocleidomastoid and trapezius strength.  CN 12 (Hypoglossal): The tongue is midline. No atrophy or fasciculations.[name]   MOTOR:  Muscle Strength: 5/5RUE, 5/5LUE, 5/5RLE, 5/5LLE.   COORDINATION:   Intact finger-to-nose, no tremor, no pronator drift.   SENSATION:   Intact to light touch all four extremities.  GAIT: Gait normal without ataxia. Negative Romberg   Psychiatric:        Mood and Affect: Mood normal.     ED Results / Procedures / Treatments   Labs (all labs ordered are listed, but only abnormal results are displayed) Labs Reviewed  BASIC METABOLIC PANEL - Abnormal; Notable for the following components:      Result Value   Glucose, Bld 198 (*)    BUN 25 (*)    Calcium 8.5 (*)    All other components within normal limits  URINALYSIS, ROUTINE W REFLEX MICROSCOPIC - Abnormal; Notable for the following components:   Color, Urine STRAW (*)    All  other components within normal limits  CBG MONITORING, ED - Abnormal; Notable for the following components:   Glucose-Capillary 194 (*)    All other components within normal limits  RESP PANEL BY RT-PCR (RSV, FLU A&B, COVID)  [id number]  CBC    EKG EKG Interpretation  Date/Time:  Tuesday August 13 2022 00:17:54 EDT Ventricular Rate:  71 PR Interval:  121 QRS Duration: 148 QT Interval:  462 QTC Calculation: 503 R Axis:   3 Text Interpretation: Sinus rhythm Atrial premature complex Left bundle branch block Confirmed by Regan Lemming ([id number]) on 08/13/2022 2:19:59 AM  Radiology MR BRAIN WO CONTRAST  Result Date: 08/13/2022 CLINICAL DATA:  77 year old female  with dizziness, hyperglycemia. EXAM: MRI HEAD WITHOUT CONTRAST TECHNIQUE: Multiplanar, multiecho pulse sequences of the brain and surrounding structures were obtained without intravenous contrast. COMPARISON:  CT head, CTA head and neck 0308 hours today. FINDINGS: Brain: No restricted diffusion to suggest acute infarction. No midline shift, mass effect, evidence of mass lesion, ventriculomegaly, extra-axial collection or acute intracranial hemorrhage. Cervicomedullary junction and pituitary are within normal limits. Scattered, Patchy and confluent bilateral cerebral white matter T2 and FLAIR hyperintensity in a nonspecific pattern with periventricular, subcortical, and also deep white matter capsule involvement. No definite chronic cerebral blood products on SWI. No cortical encephalomalacia identified. Moderate T2 and FLAIR heterogeneity in the bilateral deep gray nuclei is partially due to the perivascular spaces, but small chronic lacunar infarcts are also suspected. Brainstem and cerebellum appear spared. Vascular: Major intracranial vascular flow voids are preserved. Skull and upper cervical spine: Negative for age visible cervical spine. Normal bone marrow signal. Sinuses/Orbits: Negative orbits. Mild bilateral paranasal sinus mucosal  thickening. Other: Mild to moderate left mastoid air cell effusion. Negative visible nasopharynx. Right mastoids are clear. Negative visible nasopharynx. Redemonstrated small nodule at the superior right submandibular gland level on series 7, image 8, T1 hypointense. This is either adjacent to or within the gland. There is only subtle diffusion heterogeneity there (series 13, image 18). IMPRESSION: 1. No acute intracranial abnormality. Advanced signal changes in the bilateral cerebral white matter and deep gray nuclei, nonspecific but most commonly due to chronic small vessel disease. 2. Right superior submandibular gland region T1 hypointense nodule corresponding to the CTA finding. It remains unclear whether this is within the gland or a small adjacent lymph node. Follow up recommendations as per the CTA. 3. Left mastoid air cell effusion, likely postinflammatory and significance doubtful. Electronically Signed   By: Genevie Ann M.D.   On: 08/13/2022 06:34   CT ANGIO HEAD NECK W WO CM  Result Date: 08/13/2022 CLINICAL DATA:  77 year old female with dizziness, hyperglycemia. EXAM: CT ANGIOGRAPHY HEAD AND NECK TECHNIQUE: Multidetector CT imaging of the head and neck was performed using the standard protocol during bolus administration of intravenous contrast. Multiplanar CT image reconstructions and MIPs were obtained to evaluate the vascular anatomy. Carotid stenosis measurements (when applicable) are obtained utilizing NASCET criteria, using the distal internal carotid diameter as the denominator. RADIATION DOSE REDUCTION: This exam was performed according to the departmental dose-optimization program which includes automated exposure control, adjustment of the mA and/or kV according to patient size and/or use of iterative reconstruction technique. CONTRAST:  59mL OMNIPAQUE IOHEXOL 350 MG/ML SOLN COMPARISON:  None Available. FINDINGS: CT HEAD Brain: Cerebral volume is within normal limits for age. No midline  shift, ventriculomegaly, mass effect, evidence of mass lesion, intracranial hemorrhage or evidence of cortically based acute infarction. Patchy and confluent bilateral cerebral white matter hypodensity in both hemispheres. Involvement of the bilateral deep white matter capsules, and heterogeneity in the bilateral deep gray nuclei perhaps greater on the left. No cortical encephalomalacia identified. Posterior fossa remains within normal limits. Calvarium and skull base: No acute osseous abnormality identified. Paranasal sinuses: Bilateral tympanic cavities and mastoids appear well aerated. Paranasal sinuses are well aerated with minor mucosal thickening. Orbits: Visualized orbits and scalp soft tissues are within normal limits. CTA NECK Skeleton: Osteopenia. No acute osseous abnormality identified. Mild for age cervical spine degeneration at most levels. Sclerotic right pedicle and posterior elements of C7 is probably due to benign bone island (series 4, image 115). No suspicious osseous lesion identified. Upper chest: Retained  secretions in the trachea at the thoracic inlet along both tracheal walls (series 4, image 131), but otherwise the visible major airways are patent. Mild upper lung atelectasis. No superior mediastinal lymphadenopathy. Visible central pulmonary arteries are patent. Other neck: Small 10 mm soft tissue nodule within versus adjacent to the right submandibular gland superomedial pole. See series 4, image 90 and series 7, image 71. But otherwise negative. No cervical lymphadenopathy. Aortic arch: Calcified aortic atherosclerosis. 3 vessel arch configuration. Right carotid system: Brachiocephalic artery soft and calcified plaque without stenosis. Right CCA origin is spared. Mildly tortuous right CCA. Soft plaque along the lateral wall before the bifurcation without stenosis. Right carotid bifurcation soft and calcified plaque mostly at the ICA origin. Less than 50 % stenosis with respect to the  distal vessel. Mild ICA tortuosity. Left carotid system: Soft and calcified plaque at the left CCA origin without stenosis. Tortuous left CCA. Soft and calcified plaque at the level of the larynx without stenosis. Bulky calcified plaque at the left ICA origin and bulb with high-grade stenosis at the bulb series 8, image 142 and on axial image 6 image 185 is numerically estimated at 75 % with respect to the distal vessel and approaches a short segment string sign. However, the left ICA remains patent to the skull base. Vertebral arteries: Tortuous right subclavian artery origin with soft and calcified plaque and a kinked appearance but no significant stenosis. Normal right vertebral artery origin. Tortuous right V1 segment. Patent right vertebral to the skull base with no significant plaque or stenosis. Proximal left subclavian artery soft and calcified plaque as well as tortuosity with less than 50 % stenosis with respect to the distal vessel. Left vertebral artery origin remains normal on series 7, image 100. Tortuous left V1 segment with a kinked appearance. Fairly codominant left vertebral artery throughout the neck with no significant plaque or stenosis. CTA HEAD Posterior circulation: Codominant V4 segments with mild plaque and no significant stenosis to the vertebrobasilar junction. Normal right PICA origin. Left AICA appears to be dominant and patent. Patent basilar artery with mild to moderate atherosclerotic irregularity in the distal basilar but no significant stenosis to the basilar tip. Patent SCA and PCA origins. Posterior communicating arteries are diminutive or absent. Bilateral PCA branches are patent. Right PCA branches are within normal limits. There is mild to moderate left P1/P2 junction irregularity and stenosis (series 9, image 48). Anterior circulation: Both ICA siphons are patent. Both cavernous segments are heavily calcified. Moderate stenosis occurs in the left cavernous and proximal  supraclinoid segments. Moderate right ICA cavernous and moderate to severe proximal right ICA supraclinoid stenosis (series 7, image 79). Patent carotid termini, MCA and ACA origins. Mild left A1 irregularity. Normal anterior communicating artery. Bilateral ACA branches are within normal limits. Left MCA M1 segment is patent with mild irregularity. Left MCA bifurcation is patent without stenosis. Right MCA M1 segment and trifurcation are patent without stenosis. Bilateral MCA branches are within normal limits. Venous sinuses: Patent. Anatomic variants: None. Review of the MIP images confirms the above findings IMPRESSION: 1. Negative for large vessel occlusion. No acute intracranial abnormality by CT. Advanced cerebral white matter changes with heterogeneity in the deep gray nuclei compatible with small vessel disease. 2. Positive for bulky calcified plaque at the Left ICA bulb with High-grade stenosis numerically estimated at 75%, approaching a RADIOGRAPHIC STRING SIGN. 3. Superimposed heavily calcified Bilateral ICA siphons. Moderate left and moderate to severe right cavernous and supraclinoid segment stenoses. Other atherosclerosis at  the distal basilar artery, left PCA, left A1 and M1 without significant stenosis. 4. In the right Neck there is a maximal right level 1 B lymph node versus 10 mm mass of the right submandibular gland. Cannot exclude a small salivary gland neoplasm (benign or malignant). Recommend follow-up with ENT and routine protocol Neck CT with IV contrast. 5. Retained secretions in the trachea at the thoracic inlet. Aortic Atherosclerosis (ICD10-I70.0). Electronically Signed   By: Genevie Ann M.D.   On: 08/13/2022 04:15   DG Chest Portable 1 View  Result Date: 08/13/2022 CLINICAL DATA:  Dizziness, presyncope, hyperglycemia EXAM: PORTABLE CHEST 1 VIEW COMPARISON:  02/06/2022 FINDINGS: Lungs are clear. No pneumothorax or pleural effusion. Stable cardiomegaly. Pulmonary vascularity is normal.  Thoracic dextroscoliosis again noted. No acute bone abnormality. IMPRESSION: 1. No active disease. 2. Stable cardiomegaly. Electronically Signed   By: Fidela Salisbury M.D.   On: 08/13/2022 03:56   CT ABDOMEN PELVIS W CONTRAST  Result Date: 08/13/2022 CLINICAL DATA:  Abdominal pain, acute, nonlocalized EXAM: CT ABDOMEN AND PELVIS WITH CONTRAST TECHNIQUE: Multidetector CT imaging of the abdomen and pelvis was performed using the standard protocol following bolus administration of intravenous contrast. RADIATION DOSE REDUCTION: This exam was performed according to the departmental dose-optimization program which includes automated exposure control, adjustment of the mA and/or kV according to patient size and/or use of iterative reconstruction technique. CONTRAST:  13mL OMNIPAQUE IOHEXOL 350 MG/ML SOLN COMPARISON:  06/27/2021 FINDINGS: Lower chest: No acute abnormality. Interval hiatal hernia repair. Moderate coronary artery calcification. Hepatobiliary: No focal liver abnormality is seen. No gallstones, gallbladder wall thickening, or biliary dilatation. Pancreas: Atrophic but otherwise unremarkable Spleen: Unremarkable Adrenals/Urinary Tract: The adrenal glands are unremarkable. Kidneys are normal in position. Mild to moderate bilateral renal cortical atrophy. Simple cortical cyst noted within the upper pole the left kidney for which no follow-up imaging is recommended. The kidneys are otherwise unremarkable. The bladder is mildly distended but is otherwise unremarkable. Stomach/Bowel: The stomach, small bowel, and large bowel are unremarkable. The appendix is absent. No free intraperitoneal gas or fluid. Vascular/Lymphatic: Aortic atherosclerosis. No enlarged abdominal or pelvic lymph nodes. Reproductive: Fibroid uterus.  No adnexal mass. Other: No significant abdominal wall hernia. Musculoskeletal: No acute bone abnormality. No lytic or blastic bone lesion. Degenerative changes are seen within the lumbar spine.  IMPRESSION: 1. No acute intra-abdominal pathology identified. No definite radiographic explanation for the patient's reported symptoms. 2. Interval hiatal hernia repair. 3. Moderate coronary artery calcification. 4. Fibroid uterus. 5. Aortic atherosclerosis. Aortic Atherosclerosis (ICD10-I70.0). Electronically Signed   By: Fidela Salisbury M.D.   On: 08/13/2022 03:40    Procedures Procedures    Medications Ordered in ED Medications  iohexol (OMNIPAQUE) 350 MG/ML injection 75 mL (75 mLs Intravenous Contrast Given 08/13/22 0304)  ondansetron (ZOFRAN) injection 4 mg (4 mg Intravenous Given 08/13/22 0326)  lactated ringers bolus 1,000 mL (0 mLs Intravenous Stopped 08/13/22 0801)    ED Course/ Medical Decision Making/ A&P                             Medical Decision Making Amount and/or Complexity of Data Reviewed Radiology: ordered.  Risk OTC drugs. Prescription drug management. Decision regarding hospitalization.    77 year old female presenting to the emergency department with a chief complaint of dizziness and dry mouth.  The patient was bedside inpatient as her husband is in the hospital currently.  She developed sudden onset lightheadedness at around 11:00 tonight.  Initially described as room spinning dizziness, on further elaboration was described as a lightheadedness.  She feels dehydrated and has a dry mouth.  No syncopal episode.  Symptoms have since eased off since coming to the emergency department.  Mild confusion associated with her symptoms.  No facial droop, difficulty speaking or swallowing, numbness or focal weakness.  She is able to ambulate. She denies any headaches, fevers or chills.  On arrival, the patient was afebrile, not tachycardic or tachypneic, BP 138/79, saturating 93% on room air.  Patient physical exam significant for a completely normal neurologic exam.  Her symptoms have since resolved.  She endorsed lightheadedness.  Initially described as room spinning  dizziness that was sudden onset however on further elaboration appears to be more lightheadedness.  Her neurologic exam is normal at this time.  Low concern for acute CVA.  Considered vascular abnormality, considered dehydration with dry mucous membranes noted on exam.  Considered orthostatic near syncope, vasovagal near syncope. No Chest pain or shortness of breath. No vision changes.  Lab evaluation significant for CBG 194, code stroke not called this patient's symptoms had resolved on my evaluation and the patient had a normal neurologic exam.  Was able to stand without room spinning dizziness, no dysmetria, no nystagmus.  No focal weakness or numbness, negative Romberg.  Remainder of labs significant for BMP with hyperglycemia to 198, UA without evidence of UTI, COVID-19, influenza, RSV PCR testing negative, CBC without a leukocytosis or anemia.  Urinalysis without evidence of UTI, CBC without a leukocytosis or anemia, BMP with hyperglycemia to 198, otherwise unremarkable, chest x-ray was performed which revealed no acute cardiac or pulmonary abnormality, CT angio head and neck was ordered.  Patient having nausea chronically post hernia repair, family requesting CT of the abdomen pelvis to further evaluate.  CT resulted negative for acute abnormalities:   IMPRESSION:  1. No acute intra-abdominal pathology identified. No definite  radiographic explanation for the patient's reported symptoms.  2. Interval hiatal hernia repair.  3. Moderate coronary artery calcification.  4. Fibroid uterus.  5. Aortic atherosclerosis.    Aortic Atherosclerosis (ICD10-I70.0).    CTA Head and Neck: IMPRESSION:  1. Negative for large vessel occlusion. No acute intracranial  abnormality by CT. Advanced cerebral white matter changes with  heterogeneity in the deep gray nuclei compatible with small vessel  disease.    2. Positive for bulky calcified plaque at the Left ICA bulb with  High-grade stenosis  numerically estimated at 75%, approaching a  RADIOGRAPHIC STRING SIGN.    3. Superimposed heavily calcified Bilateral ICA siphons. Moderate  left and moderate to severe right cavernous and supraclinoid segment  stenoses. Other atherosclerosis at the distal basilar artery, left  PCA, left A1 and M1 without significant stenosis.    4. In the right Neck there is a maximal right level 1 B lymph node  versus 10 mm mass of the right submandibular gland. Cannot exclude a  small salivary gland neoplasm (benign or malignant). Recommend  follow-up with ENT and routine protocol Neck CT with IV contrast.    5. Retained secretions in the trachea at the thoracic inlet. Aortic  Atherosclerosis (ICD10-I70.0).   Given the findings of carotid atherosclerosis that is salting and both intra and extracranial moderate to high-grade stenosis, neurology and vascular surgery were consulted.    Spoke with Dr. Carlis Abbott of on-call vascular surgery, felt that the patient could undergo outpatient workup, carotid duplex in the office and consideration for outpatient carotid endarterectomy.  Spoke  with Dr. Leonel Ramsay of neurology who recommended MRI of the brain to rule out cerebellar CVA, feel less likely CVA given the combination of symptoms and improvement, less concern for TIA.  Given the findings, recommended outpatient medical management with baby aspirin daily as an antiplatelet agent which was ordered.  MRI Brain: IMPRESSION:  1. No acute intracranial abnormality.  Advanced signal changes in the bilateral cerebral white matter and  deep gray nuclei, nonspecific but most commonly due to chronic small  vessel disease.    2. Right superior submandibular gland region T1 hypointense nodule  corresponding to the CTA finding. It remains unclear whether this is  within the gland or a small adjacent lymph node. Follow up  recommendations as per the CTA.    3. Left mastoid air cell effusion, likely postinflammatory and   significance doubtful.     Updated the patient and her daughter bedside regarding the findings. ON repeat assessment, the patient remained neurologically intact and with resolution in symptoms. She was ambulatory down the hall in the ED with nursing. PT without evidence of CVA on MRI.  Negative orthostatics, fluid resuscitated with 1 L LR bolus given dry mucous membranes on exam.  Feeling symptomatically improved.  Plan for outpatient neurology, vascular surgery, and ENT follow-up given the findings. Advised to call their PCP to help coordinate her care. Return precautions discussed.   Final Clinical Impression(s) / ED Diagnoses Final diagnoses:  Dizziness  Lightheadedness  Stenosis of left carotid artery  Dehydration    Rx / DC Orders ED Discharge Orders          Ordered    Ambulatory referral to Vascular Surgery        08/13/22 0527    aspirin 81 MG chewable tablet  Daily        08/13/22 0721    Ambulatory referral to Neurology       Comments: An appointment is requested in approximately: 2 weeks   08/13/22 0721    Ambulatory referral to ENT        08/13/22 0725              Regan Lemming, MD 08/14/22 0121

## 2022-08-13 NOTE — ED Notes (Signed)
Pt ambulated down the hallway without any complaints of light headedness or dizziness. Pt complaining of pressure behind the eyes.

## 2022-08-14 ENCOUNTER — Telehealth: Payer: Self-pay

## 2022-08-14 ENCOUNTER — Encounter: Payer: Self-pay | Admitting: Neurology

## 2022-08-14 ENCOUNTER — Other Ambulatory Visit: Payer: Self-pay | Admitting: *Deleted

## 2022-08-14 DIAGNOSIS — R0989 Other specified symptoms and signs involving the circulatory and respiratory systems: Secondary | ICD-10-CM

## 2022-08-14 NOTE — Transitions of Care (Post Inpatient/ED Visit) (Signed)
   08/14/2022  Name: Susan Keith MRN: [id number] DOB: 1946-04-08  Today's TOC FU Call Status: Today's TOC FU Call Status:: Successful TOC FU Call Competed TOC FU Call Complete Date: 08/14/22  Transition Care Management Follow-up Telephone Call Date of Discharge: 08/13/22 Discharge Facility: [facility]) Type of Discharge: Emergency Department Reason for ED Visit: Other: (dizziness) How have you been since you were released from the hospital?: Better Any questions or concerns?: No  Items Reviewed: Did you receive and understand the discharge instructions provided?: Yes Medications obtained and verified?: Yes (Medications Reviewed) Any new allergies since your discharge?: No Dietary orders reviewed?: Yes Do you have support at home?: Yes People in Home: child(ren), adult  Home Care and Equipment/Supplies: [place] Ordered?: NA Any new equipment or medical supplies ordered?: NA  Functional Questionnaire: Do you need assistance with bathing/showering or dressing?: No Do you need assistance with meal preparation?: No Do you need assistance with eating?: No Do you have difficulty maintaining continence: No Do you need assistance with getting out of bed/getting out of a chair/moving?: No Do you have difficulty managing or taking your medications?: No  Follow up appointments reviewed: PCP Follow-up appointment confirmed?: [facility] Follow-up appointment confirmed?: No Reason Specialist Follow-Up Not Confirmed: Patient has Specialist Provider Number and will Call for Appointment Do you need transportation to your follow-up appointment?: No Do you understand care options if your condition(s) worsen?: Yes-patient verbalized understanding    [place], [place] Nurse Health Advisor Direct Dial [phone]

## 2022-08-26 ENCOUNTER — Ambulatory Visit (HOSPITAL_COMMUNITY)
Admission: RE | Admit: 2022-08-26 | Discharge: 2022-08-26 | Disposition: A | Discharge status: Home / Self Care | Payer: Medicare Other | Source: Ambulatory Visit | Attending: Vascular Surgery | Admitting: Vascular Surgery

## 2022-08-26 DIAGNOSIS — R0989 Other specified symptoms and signs involving the circulatory and respiratory systems: Secondary | ICD-10-CM

## 2022-08-27 ENCOUNTER — Ambulatory Visit (INDEPENDENT_AMBULATORY_CARE_PROVIDER_SITE_OTHER): Payer: Medicare Other | Admitting: Vascular Surgery

## 2022-08-27 ENCOUNTER — Encounter: Payer: Self-pay | Admitting: Vascular Surgery

## 2022-08-27 VITALS — BP 153/100 | HR 65 | Temp 97.4°F | Resp 14 | Ht 61.0 in | Wt 159.0 lb

## 2022-08-27 DIAGNOSIS — I6523 Occlusion and stenosis of bilateral carotid arteries: Secondary | ICD-10-CM | POA: Diagnosis not present

## 2022-08-27 DIAGNOSIS — I779 Disorder of arteries and arterioles, unspecified: Secondary | ICD-10-CM | POA: Insufficient documentation

## 2022-08-27 MED ORDER — SIMVASTATIN 10 MG PO TABS: 10.0000 mg | ORAL_TABLET | Freq: Every day | ORAL | 3 refills | Status: DC

## 2022-08-27 NOTE — Progress Notes (Signed)
Patient name: Susan Keith MRN: [id number] DOB: 05-10-1946 Sex: female  REASON FOR CONSULT: Carotid artery disease  HPI: Susan Keith is a 77 y.o. female, with history of hypertension and hyperlipidemia that presents for evaluation of carotid artery disease.  She was in the ED on 08/13/2022 with dizziness.  A CTA head and neck was ordered.  There was evidence of a high-grade stenosis in the left ICA with a 75% stenosis and also bilateral intracranial disease with ICA siphon stenosis.[name]  MRI brain showed no acute intracranial abnormality.  She denies any further episodes of dizziness or passing out since she was seen in the ED.  Carotid duplex yesterday was 1-39% right ICA stenosis and 40-59% left ICA stenosis with antegrade flow in both vertebral arteries..  Smoke about 4 to 5 cigarettes a day.  Denies any previous strokes or TIAs.  Past Medical History:  Diagnosis Date   Allergic rhinitis    Arthritis    "right hip" (01/07/2017)   Diabetes mellitus without complication 0000000   Fibroids    "they've decreased in size" (01/07/2017)   GERD (gastroesophageal reflux disease)    Hiatal hernia    High cholesterol    Hypertension    Left bundle branch block    Osteoporosis    Pre-diabetes    Thyroid disease    on synthroid   Vitamin D deficiency     Past Surgical History:  Procedure Laterality Date   APPENDECTOMY     COLONOSCOPY  2017   with endoscopy   FRACTURE SURGERY     INSERTION OF MESH N/A 08/14/2021   Procedure: INSERTION OF MESH;  Surgeon: Ralene Ok, MD;  Location: [facility] OR;  Service: General;  Laterality: N/A;   UPPER GASTROINTESTINAL ENDOSCOPY  2019   WRIST FRACTURE SURGERY Right 05/2015   XI ROBOTIC ASSISTED HIATAL HERNIA REPAIR N/A 08/14/2021   Procedure: XI ROBOTIC ASSISTED HIATAL HERNIA REPAIR WITH FUNDOPLICATION;  Surgeon: Ralene Ok, MD;  Location: [facility];  Service: General;  Laterality: N/A;    Family History  Problem Relation Age of Onset   Colon cancer  Neg Hx    Colon polyps Neg Hx    Rectal cancer Neg Hx    Stomach cancer Neg Hx    Esophageal cancer Neg Hx     SOCIAL HISTORY: Social History   Socioeconomic History   Marital status: Married    Spouse name: Not on file   Number of children: 2   Years of education: Not on file   Highest education level: Not on file  Occupational History   Not on file  Tobacco Use   Smoking status: Every Day    Packs/day: 0.25    Years: 48.00    Additional pack years: 0.00    Total pack years: 12.00    Types: Cigarettes   Smokeless tobacco: Never  Vaping Use   Vaping Use: Never used  Substance and Sexual Activity   Alcohol use: No   Drug use: Never   Sexual activity: Not on file  Other Topics Concern   Not on file  Social History Narrative   Not on file   Social Determinants of Health   Financial Resource Strain: Not on file  Food Insecurity: Not on file  Transportation Needs: Not on file  Physical Activity: Not on file  Stress: Not on file  Social Connections: Not on file  Intimate Partner Violence: Not on file    No Known Allergies  Current Outpatient Medications  Medication Sig Dispense Refill   amLODipine (NORVASC) 5 MG tablet TAKE 1 TABLET(5 MG) BY MOUTH DAILY 90 tablet 3   aspirin 81 MG chewable tablet Chew 1 tablet (81 mg total) by mouth daily. 90 tablet 0   carvedilol (COREG) 6.25 MG tablet Take 1 tablet (6.25 mg total) by mouth 2 (two) times daily with a meal. 60 tablet 11   glucose blood (ACCU-CHEK GUIDE) test strip Use to check blood sugars once a day 100 each 3   glucose blood test strip Use as instructed 100 each 12   levothyroxine (SYNTHROID) 88 MCG tablet Take 1 tablet (88 mcg total) by mouth daily. 90 tablet 3   pantoprazole (PROTONIX) 40 MG tablet TAKE 1 TABLET(40 MG) BY MOUTH TWICE DAILY 60 tablet 3   repaglinide (PRANDIN) 0.5 MG tablet TAKE 1 TABLET(0.5 MG) BY MOUTH THREE TIMES DAILY BEFORE MEALS 270 tablet 1   fluticasone (FLONASE) 50 MCG/ACT nasal spray  Place 2 sprays into both nostrils daily. (Patient not taking: Reported on 08/27/2022)     loratadine (CLARITIN) 10 MG tablet Take 1 tablet (10 mg total) by mouth daily. (Patient not taking: Reported on 08/27/2022) 90 tablet 3   metoCLOPramide (REGLAN) 5 MG tablet Take 1 tablet (5 mg total) by mouth 3 (three) times daily before meals. 90 tablet 1   omeprazole (PRILOSEC) 40 MG capsule Take 1 capsule (40 mg total) by mouth daily. Before breakfast (Patient not taking: Reported on 08/27/2022) 30 capsule 11   ondansetron (ZOFRAN) 4 MG tablet Take 1 tablet (4 mg total) by mouth every 6 (six) hours. (Patient not taking: Reported on 08/27/2022) 40 tablet 3   phentermine (ADIPEX-P) 37.5 MG tablet Take 1 tablet (37.5 mg total) by mouth daily before breakfast. (Patient not taking: Reported on 06/11/2022) 30 tablet 2   spironolactone (ALDACTONE) 25 MG tablet Take 1 tablet (25 mg total) by mouth daily. (Patient not taking: Reported on 08/27/2022) 90 tablet 3   No current facility-administered medications for this visit.    REVIEW OF SYSTEMS:  [X]  denotes positive finding, [ ]  denotes negative finding Cardiac  Comments:  Chest pain or chest pressure:    Shortness of breath upon exertion:    Short of breath when lying flat:    Irregular heart rhythm:        Vascular    Pain in calf, thigh, or hip brought on by ambulation:    Pain in feet at night that wakes you up from your sleep:     Blood clot in your veins:    Leg swelling:         Pulmonary    Oxygen at home:    Productive cough:     Wheezing:         Neurologic    Sudden weakness in arms or legs:     Sudden numbness in arms or legs:     Sudden onset of difficulty speaking or slurred speech:    Temporary loss of vision in one eye:     Problems with dizziness:         Gastrointestinal    Blood in stool:     Vomited blood:         Genitourinary    Burning when urinating:     Blood in urine:        Psychiatric    Major depression:          Hematologic    Bleeding problems:    Problems with blood  clotting too easily:        Skin    Rashes or ulcers:        Constitutional    Fever or chills:      PHYSICAL EXAM: Vitals:   08/27/22 1113 08/27/22 1118  BP: (!) 151/85 (!) 153/100  Pulse: 65 65  Resp: 14   Temp: (!) 97.4 F (36.3 C)   TempSrc: Temporal   SpO2: 95%   Weight: 159 lb (72.1 kg)   Height: 5\' 1"  (1.549 m)     GENERAL: The patient is a well-nourished female, in no acute distress. The vital signs are documented above. CARDIAC: There is a regular rate and rhythm.  VASCULAR:  No previous neck incisions PULMONARY: No respiratory distress. ABDOMEN: Soft and non-tender. MUSCULOSKELETAL: There are no major deformities or cyanosis. NEUROLOGIC: No focal weakness or paresthesias are detected.  CN II-XII grossly intact. SKIN: There are no ulcers or rashes noted. PSYCHIATRIC: The patient has a normal affect.  DATA:   Carotid duplex yesterday shows 1 to 39% right ICA stenosis 40 to 59% left ICA stenosis  CTA head and neck 08/13/2022 with concern for 75% stenosis in the left ICA, less than 50% right carotid stenosis  Assessment/Plan:  77 year old female presents for evaluation of carotid artery disease.  Patient was in the hospital and evaluated in the ED when she developed dizziness.  CTA head/neck was   ordered concerning for 75% stenosis in the left ICA.  I reviewed her images and I do not feel that her stenosis is that severe.  Her duplex yesterday confirms a 40 to 59% left ICA stenosis by velocity criteria and she has a contralateral 1 to 39% right ICA stenosis.  I have recommended no surgical intervention.  I do not think her episode of dizziness is related to her carotid artery disease.  No evidence of significant hypoperfusion with antegrade flow in both vertebral arteries and no significant contralateral carotid artery disease.  I discussed for asymptomatic carotid disease surgery for greater than 80%  stenosis.  I recommended follow-up in 1 year with carotid duplex.  I did prescribe simvastatin for risk reduction and asked that she start an 81 mg aspirin daily.   Marty Heck, MD [facility] Office: [phone]

## 2022-09-03 ENCOUNTER — Ambulatory Visit: Payer: Medicare Other | Admitting: Neurology

## 2022-09-03 ENCOUNTER — Encounter: Payer: Self-pay | Admitting: Neurology

## 2022-10-22 ENCOUNTER — Encounter: Payer: Self-pay | Admitting: Neurology

## 2022-10-22 ENCOUNTER — Ambulatory Visit (INDEPENDENT_AMBULATORY_CARE_PROVIDER_SITE_OTHER): Payer: Medicare Other | Admitting: Neurology

## 2022-10-22 VITALS — BP 122/68 | HR 51 | Ht 61.0 in | Wt 158.0 lb

## 2022-10-22 DIAGNOSIS — I6523 Occlusion and stenosis of bilateral carotid arteries: Secondary | ICD-10-CM | POA: Diagnosis not present

## 2022-10-22 DIAGNOSIS — R001 Bradycardia, unspecified: Secondary | ICD-10-CM | POA: Diagnosis not present

## 2022-10-22 DIAGNOSIS — K118 Other diseases of salivary glands: Secondary | ICD-10-CM

## 2022-10-22 DIAGNOSIS — R42 Dizziness and giddiness: Secondary | ICD-10-CM | POA: Diagnosis not present

## 2022-10-22 NOTE — Patient Instructions (Addendum)
Follow-up with cardiology for blood pressure and heart rate  Follow-up with ENT  Recommend to quit smoking   Return to clinic in 1 year

## 2022-10-22 NOTE — Progress Notes (Unsigned)
[facility] Clinic Note - Initial Visit   Date: 10/22/2022   Susan Keith MRN: [id number] DOB: 26-Sep-1945   Dear Dr. Karene Fry:  Thank you for your kind referral of Susan Keith for consultation of lightheadedness. Although her history is well known to you, please allow [place] to reiterate it for the purpose of our medical record. The patient was accompanied to the clinic by daughter who also provides collateral information and serves as Nurse, learning disability.     Susan Keith is a 77 y.o. right-handed female with hypertension, hypothyroidism, and GERD presenting for evaluation of intracranial stenosis.   IMPRESSION/PLAN: Intracranial stenosis involving bilateral ICA.  No evidence of acute stroke on MRI.  I recommend that she continue medical management with aspirin 81mg  and simvastatin 10mg  daily. I encouraged her to see her PCP to have lipid panel checked and adjust statin based on this. Optimize blood pressure as per cardiology.  Strongly advised to quit smoking.   Headaches most likely related to elevated blood pressure.  I recommend that she start to monitor BP and HR at home.  Her HR is 51 today and noted to be 62 in the ER.  I would like her to follow-up with cardiology to evaluate her bradycardia.  If headaches persist with normal BP, preventative medication can be considered.  Submandibular gland mass. Referral placed to ENT.   Return to clinic in 6 months  ------------------------------------------------------------- History of present illness: She was visiting her husband in the hospital and developed dizziness.  Her blood pressure was elevated in 200s when nursing staff checked her, so EMS was called.  BP in the ER was 157/70 and HR 62 and symptoms slowly started to improve.  She reports symptoms of lightheadedness moreso than spinning sensation.  She had CTA head and neck which showed intracranial atherosclerosis most notably with bilateral intracranial ICA  calcification at the siphons, as well as left ICA bulb 75% stenosis.  MRI brain did not show acute stroke.  She has not had any further spells.  She was evaluated for carotid disease by vascular surgery on 4/2 who performed carotid ultrasound which shows left ICA stenosis to be 40-59%.  Because symptoms of lightheadedness were not felt to be related to her carotid disease, she was managed medically and started on aspirin and statin therapy.  She has not had any further spells.   She does endorses having headaches when her blood pressure is elevated. Headaches are dull and episodic.  No nausea, vomiting, photophobia, or phonophobia.   Of note, her CTA did mention abnormality in the submandibular gland and recommended ENT evaluation.    Out-side paper records, electronic medical record, and images have been reviewed where available and summarized as:  MRI brain wo contrast 08/13/2022" 1. No acute intracranial abnormality. Advanced signal changes in the bilateral cerebral white matter and deep gray nuclei, nonspecific but most commonly due to chronic small vessel disease.   2. Right superior submandibular gland region T1 hypointense nodule corresponding to the CTA finding. It remains unclear whether this is within the gland or a small adjacent lymph node. Follow up recommendations as per the CTA.   3. Left mastoid air cell effusion, likely postinflammatory and significance doubtful.   CTA head and neck 10/22/2022: 1. Negative for large vessel occlusion. No acute intracranial abnormality by CT. Advanced cerebral white matter changes with heterogeneity in the deep gray nuclei compatible with small vessel disease.   2. Positive for bulky calcified plaque at the Left ICA bulb  with High-grade stenosis numerically estimated at 75%, approaching a RADIOGRAPHIC STRING SIGN.   3. Superimposed heavily calcified Bilateral ICA siphons. Moderate left and moderate to severe right cavernous and supraclinoid  segment stenoses. Other atherosclerosis at the distal basilar artery, left PCA, left A1 and M1 without significant stenosis.   4. In the right Neck there is a maximal right level 1 B lymph node versus 10 mm mass of the right submandibular gland. Cannot exclude a small salivary gland neoplasm (benign or malignant). Recommend follow-up with ENT and routine protocol Neck CT with IV contrast.   5. Retained secretions in the trachea at the thoracic inlet. Aortic Atherosclerosis (ICD10-I70.0)  Lab Results  Component Value Date   HGBA1C 6.6 (H) 05/07/2022   Lab Results  Component Value Date   VITAMINB12 407 09/17/2021   Lab Results  Component Value Date   TSH 9.60 (H) 09/17/2021   No results found for: "ESRSEDRATE", "POCTSEDRATE"  Lab Results  Component Value Date   CHOL 216 (H) 03/18/2018   HDL 58 03/18/2018   LDLCALC 133 (H) 03/18/2018   TRIG 125 03/18/2018   CHOLHDL 3.7 03/18/2018     Past Medical History:  Diagnosis Date   Allergic rhinitis    Arthritis    "right hip" (01/07/2017)   Diabetes mellitus without complication (HCC) 2017   Fibroids    "they've decreased in size" (01/07/2017)   GERD (gastroesophageal reflux disease)    Hiatal hernia    High cholesterol    Hypertension    Left bundle branch block    Osteoporosis    Pre-diabetes    Thyroid disease    on synthroid   Vitamin D deficiency     Past Surgical History:  Procedure Laterality Date   APPENDECTOMY     COLONOSCOPY  2017   with endoscopy   FRACTURE SURGERY     INSERTION OF MESH N/A 08/14/2021   Procedure: INSERTION OF MESH;  Surgeon: Axel Filler, MD;  Location: [facility] OR;  Service: General;  Laterality: N/A;   UPPER GASTROINTESTINAL ENDOSCOPY  2019   WRIST FRACTURE SURGERY Right 05/2015   XI ROBOTIC ASSISTED HIATAL HERNIA REPAIR N/A 08/14/2021   Procedure: XI ROBOTIC ASSISTED HIATAL HERNIA REPAIR WITH FUNDOPLICATION;  Surgeon: Axel Filler, MD;  Location: [facility] OR;  Service: General;   Laterality: N/A;     Medications:  Outpatient Encounter Medications as of 10/22/2022  Medication Sig   amLODipine (NORVASC) 5 MG tablet TAKE 1 TABLET(5 MG) BY MOUTH DAILY   aspirin 81 MG chewable tablet Chew 1 tablet (81 mg total) by mouth daily.   carvedilol (COREG) 6.25 MG tablet Take 1 tablet (6.25 mg total) by mouth 2 (two) times daily with a meal.   glucose blood (ACCU-CHEK GUIDE) test strip Use to check blood sugars once a day   glucose blood test strip Use as instructed   levothyroxine (SYNTHROID) 88 MCG tablet Take 1 tablet (88 mcg total) by mouth daily.   metoCLOPramide (REGLAN) 5 MG tablet Take 1 tablet (5 mg total) by mouth 3 (three) times daily before meals.   pantoprazole (PROTONIX) 40 MG tablet TAKE 1 TABLET(40 MG) BY MOUTH TWICE DAILY   repaglinide (PRANDIN) 0.5 MG tablet TAKE 1 TABLET(0.5 MG) BY MOUTH THREE TIMES DAILY BEFORE MEALS   simvastatin (ZOCOR) 10 MG tablet Take 1 tablet (10 mg total) by mouth at bedtime.   fluticasone (FLONASE) 50 MCG/ACT nasal spray Place 2 sprays into both nostrils daily. (Patient not taking: Reported on 08/27/2022)  loratadine (CLARITIN) 10 MG tablet Take 1 tablet (10 mg total) by mouth daily. (Patient not taking: Reported on 08/27/2022)   omeprazole (PRILOSEC) 40 MG capsule Take 1 capsule (40 mg total) by mouth daily. Before breakfast (Patient not taking: Reported on 08/27/2022)   ondansetron (ZOFRAN) 4 MG tablet Take 1 tablet (4 mg total) by mouth every 6 (six) hours. (Patient not taking: Reported on 08/27/2022)   phentermine (ADIPEX-P) 37.5 MG tablet Take 1 tablet (37.5 mg total) by mouth daily before breakfast. (Patient not taking: Reported on 06/11/2022)   spironolactone (ALDACTONE) 25 MG tablet Take 1 tablet (25 mg total) by mouth daily. (Patient not taking: Reported on 08/27/2022)   No facility-administered encounter medications on file as of 10/22/2022.    Allergies: No Known Allergies  Family History: Family History  Problem Relation Age of  Onset   Diabetes Mother    Hypertension Mother    Diabetes Father    Colon cancer Neg Hx    Colon polyps Neg Hx    Rectal cancer Neg Hx    Stomach cancer Neg Hx    Esophageal cancer Neg Hx     Social History: Social History   Tobacco Use   Smoking status: Every Day    Packs/day: 0.25    Years: 48.00    Additional pack years: 0.00    Total pack years: 12.00    Types: Cigarettes   Smokeless tobacco: Never  Vaping Use   Vaping Use: Never used  Substance Use Topics   Alcohol use: No   Drug use: Never   Social History   Social History Narrative   Right Handed    Lives in a one story with her husband    Vital Signs:  BP (!) 155/68   Pulse (!) 51   Ht 5\' 1"  (1.549 m)   Wt 158 lb (71.7 kg)   SpO2 95%   BMI 29.85 kg/m    Neurological Exam: MENTAL STATUS including orientation to time, place, person, recent and remote memory, attention span and concentration, language, and fund of knowledge is normal.  Speech is not dysarthric.  CRANIAL NERVES: II:  No visual field defects.     III-IV-VI: Pupils equal round and reactive to light.  Normal conjugate, extra-ocular eye movements in all directions of gaze.  No nystagmus.  No ptosis.   V:  Normal facial sensation.    VII:  Normal facial symmetry and movements.   VIII:  Normal hearing and vestibular function.   IX-X:  Normal palatal movement.   XI:  Normal shoulder shrug and head rotation.   XII:  Normal tongue strength and range of motion, no deviation or fasciculation.  MOTOR:  No atrophy, fasciculations or abnormal movements.  No pronator drift.   Upper Extremity:  Right  Left  Deltoid  5/5   5/5   Biceps  5/5   5/5   Triceps  5/5   5/5   Wrist extensors  5/5   5/5   Wrist flexors  5/5   5/5   Finger extensors  5/5   5/5   Finger flexors  5/5   5/5   Dorsal interossei  5/5   5/5   Abductor pollicis  5/5   5/5   Tone (Ashworth scale)  0  0   Lower Extremity:  Right  Left  Hip flexors  5/5   5/5   Knee flexors   5/5   5/5   Knee extensors  5/5  5/5   Dorsiflexors  5/5   5/5   Plantarflexors  5/5   5/5   Toe extensors  5/5   5/5   Toe flexors  5/5   5/5   Tone (Ashworth scale)  0  0   MSRs:                                           Right        Left brachioradialis 2+  2+  biceps 2+  2+  triceps 2+  2+  patellar 2+  2+  ankle jerk 2+  2+  Hoffman no  no  plantar response down  down   SENSORY:  Normal and symmetric perception of light touch, pinprick, vibration, and proprioception.  Romberg's sign absent.   COORDINATION/GAIT: Normal finger-to- nose-finger.  Intact rapid alternating movements bilaterally.  Able to rise from a chair without using arms.  Gait narrow based and stable. Tandem and stressed gait intact.    Thank you for allowing me to participate in patient's care.  If I can answer any additional questions, I would be pleased to do so.    Sincerely,    Leeasia Secrist K. Allena Katz, DO

## 2022-10-30 ENCOUNTER — Ambulatory Visit: Payer: Medicare Other | Admitting: Neurology

## 2022-11-01 ENCOUNTER — Other Ambulatory Visit: Payer: Self-pay | Admitting: Internal Medicine

## 2022-11-21 ENCOUNTER — Telehealth: Payer: Self-pay | Admitting: Internal Medicine

## 2022-11-21 NOTE — Telephone Encounter (Signed)
Called Nataliya back she states the SW inform her that the form was not filled out right. She refaxed form on the 14th of June still not received back, inform daughter per chart and dad chart no forms was received. She is going to have sw to fax form back Att: Satori Krabill.Marland KitchenRaechel Chute

## 2022-11-21 NOTE — Telephone Encounter (Signed)
Patient's daughter Lu Duffel called about an issue with [employer] forms. She would like a call back at [phone].

## 2022-11-22 NOTE — Telephone Encounter (Signed)
Daughter made appt for 12/02/22.Marland Kitchenlmb

## 2022-12-02 ENCOUNTER — Encounter: Payer: Self-pay | Admitting: Internal Medicine

## 2022-12-02 ENCOUNTER — Ambulatory Visit (INDEPENDENT_AMBULATORY_CARE_PROVIDER_SITE_OTHER): Payer: Medicare Other | Admitting: Internal Medicine

## 2022-12-02 VITALS — BP 122/80 | HR 50 | Temp 98.4°F | Ht 61.0 in | Wt 151.0 lb

## 2022-12-02 DIAGNOSIS — R5383 Other fatigue: Secondary | ICD-10-CM | POA: Insufficient documentation

## 2022-12-02 DIAGNOSIS — R140 Abdominal distension (gaseous): Secondary | ICD-10-CM | POA: Diagnosis not present

## 2022-12-02 DIAGNOSIS — R110 Nausea: Secondary | ICD-10-CM

## 2022-12-02 DIAGNOSIS — I6523 Occlusion and stenosis of bilateral carotid arteries: Secondary | ICD-10-CM

## 2022-12-02 DIAGNOSIS — Z9889 Other specified postprocedural states: Secondary | ICD-10-CM | POA: Diagnosis not present

## 2022-12-02 DIAGNOSIS — N183 Chronic kidney disease, stage 3 unspecified: Secondary | ICD-10-CM | POA: Diagnosis not present

## 2022-12-02 DIAGNOSIS — R739 Hyperglycemia, unspecified: Secondary | ICD-10-CM

## 2022-12-02 DIAGNOSIS — E871 Hypo-osmolality and hyponatremia: Secondary | ICD-10-CM | POA: Diagnosis not present

## 2022-12-02 DIAGNOSIS — F17200 Nicotine dependence, unspecified, uncomplicated: Secondary | ICD-10-CM | POA: Diagnosis not present

## 2022-12-02 LAB — CBC WITH DIFFERENTIAL/PLATELET
Basophils Absolute: 0 10*3/uL (ref 0.0–0.1)
Basophils Relative: 0.4 % (ref 0.0–3.0)
Eosinophils Absolute: 0.2 10*3/uL (ref 0.0–0.7)
Eosinophils Relative: 2.4 % (ref 0.0–5.0)
HCT: 44 % (ref 36.0–46.0)
Hemoglobin: 14.2 g/dL (ref 12.0–15.0)
Lymphocytes Relative: 34.8 % (ref 12.0–46.0)
Lymphs Abs: 2.6 10*3/uL (ref 0.7–4.0)
MCHC: 32.3 g/dL (ref 30.0–36.0)
MCV: 89.4 fl (ref 78.0–100.0)
Monocytes Absolute: 0.6 10*3/uL (ref 0.1–1.0)
Monocytes Relative: 8.5 % (ref 3.0–12.0)
Neutro Abs: 4.1 10*3/uL (ref 1.4–7.7)
Neutrophils Relative %: 53.9 % (ref 43.0–77.0)
Platelets: 240 10*3/uL (ref 150.0–400.0)
RBC: 4.92 Mil/uL (ref 3.87–5.11)
RDW: 14.3 % (ref 11.5–15.5)
WBC: 7.6 10*3/uL (ref 4.0–10.5)

## 2022-12-02 LAB — COMPREHENSIVE METABOLIC PANEL
ALT: 13 U/L (ref 0–35)
AST: 17 U/L (ref 0–37)
Albumin: 4.3 g/dL (ref 3.5–5.2)
Alkaline Phosphatase: 86 U/L (ref 39–117)
BUN: 30 mg/dL — ABNORMAL HIGH (ref 6–23)
CO2: 27 mEq/L (ref 19–32)
Calcium: 9.7 mg/dL (ref 8.4–10.5)
Chloride: 104 mEq/L (ref 96–112)
Creatinine, Ser: 1.12 mg/dL (ref 0.40–1.20)
GFR: 48.37 mL/min — ABNORMAL LOW (ref >60.00)
Glucose, Bld: 99 mg/dL (ref 70–99)
Potassium: 5.4 mEq/L — ABNORMAL HIGH (ref 3.5–5.1)
Sodium: 140 mEq/L (ref 135–145)
Total Bilirubin: 0.6 mg/dL (ref 0.2–1.2)
Total Protein: 7.3 g/dL (ref 6.0–8.3)

## 2022-12-02 LAB — TSH: TSH: 0.88 u[IU]/mL (ref 0.35–5.50)

## 2022-12-02 LAB — HEMOGLOBIN A1C: Hgb A1c MFr Bld: 6.4 % (ref 4.6–6.5)

## 2022-12-02 MED ORDER — CILIDINIUM-CHLORDIAZEPOXIDE 2.5-5 MG PO CAPS: 1.0000 | ORAL_CAPSULE | Freq: Three times a day (TID) | ORAL | 3 refills | Status: DC

## 2022-12-02 NOTE — Assessment & Plan Note (Signed)
Check labs 

## 2022-12-02 NOTE — Assessment & Plan Note (Addendum)
Not better IBS type sx's vs other: nausea, diarrhea and constipation; bloating and gas. Nausea starting every AM. Smoking does not help w/GI sx's... Flagyl, Xifaxan did not help... Will try: Align x 1 month Librax prn GF diet  Gi Ref w/Amy [employer]

## 2022-12-02 NOTE — Assessment & Plan Note (Signed)
?  CFS...strress at home; husband w/rectal cancer s/p XRT - better... Check labs - see orders

## 2022-12-02 NOTE — Assessment & Plan Note (Signed)
IBS? Empiric Flagyl x 10 d Align x 1 month Librax prn

## 2022-12-02 NOTE — Assessment & Plan Note (Signed)
Monitor GFR Hydrate well 

## 2022-12-02 NOTE — Assessment & Plan Note (Addendum)
Vomiting resolved w/surgery, still nauseated IBS type sx's vs other: nausea, diarrhea and constipation; bloating and gas. Nausea starting every AM. Smoking does not help w/GI sx's... Flagyl, Xifaxan did not help... Will try: Align x 1 month Librax prn GF diet Gi Ref w/Amy [employer]

## 2022-12-02 NOTE — Assessment & Plan Note (Signed)
  Pt continues to smoke - discussed

## 2022-12-02 NOTE — Assessment & Plan Note (Signed)
On Simvastatin - no new GI sx's w/it Pt continues to smoke - discussed

## 2022-12-02 NOTE — Patient Instructions (Signed)
  Gluten free trial for 4-6 weeks. OK to use gluten-free bread and gluten-free pasta.    Gluten-Free Diet for Celiac Disease, Adult The gluten-free diet includes all foods that do not contain gluten. Gluten is a protein that is found in wheat, rye, barley, and some other grains. Following the gluten-free diet is the only treatment for people with celiac disease. It helps to prevent damage to the intestines and improves or eliminates the symptoms of celiac disease. Following the gluten-free diet requires some planning. It can be challenging at first, but it gets easier with time and practice. There are more gluten-free options available today than ever before. If you need help finding gluten-free foods or if you have questions, talk with your diet and nutrition specialist (registered dietitian) or your health care provider. What do I need to know about a gluten-free diet?  All fruits, vegetables, and meats are safe to eat and do not contain gluten.  When grocery shopping, start by shopping in the produce, meat, and dairy sections. These sections are more likely to contain gluten-free foods. Then move to the aisles that contain packaged foods if you need to.  Read all food labels. Gluten is often added to foods. Always check the ingredient list and look for warnings, such as "may contain gluten."  Talk with your dietitian or health care provider before taking a gluten-free multivitamin or mineral supplement.  Be aware of gluten-free foods having contact with foods that contain gluten (cross-contamination). This can happen at home and with any processed foods. ? Talk with your health care provider or dietitian about how to reduce the risk of cross-contamination in your home. ? If you have questions about how a food is processed, ask the manufacturer. What key words help to identify gluten? Foods that list any of these key words on the label usually contain gluten:  Wheat, flour, enriched  flour, bromated flour, white flour, durum flour, graham flour, phosphated flour, self-rising flour, semolina, farina, barley (malt), rye, and oats.  Starch, dextrin, modified food starch, or cereal.  Thickening, fillers, or emulsifiers.  Malt flavoring, malt extract, or malt syrup.  Hydrolyzed vegetable protein.  In the [place] packaged foods that are gluten-free are required to be labeled "GF." These foods should be easy to identify and are safe to eat. In the [place] food companies are also required to list common food allergens, including wheat, on their labels. Recommended foods Grains  Amaranth, bean flours, 100% buckwheat flour, corn, millet, nut flours or nut meals, GF oats, quinoa, rice, sorghum, teff, rice wafers, pure cornmeal tortillas, popcorn, and hot cereals made from cornmeal. Hominy, rice, wild rice. Some Asian rice noodles or bean noodles. Arrowroot starch, corn bran, corn flour, corn germ, cornmeal, corn starch, potato flour, potato starch flour, and rice bran. Plain, brown, and sweet rice flours. Rice polish, soy flour, and tapioca starch. Vegetables  All plain fresh, frozen, and canned vegetables. Fruits  All plain fresh, frozen, canned, and dried fruits, and 100% fruit juices. Meats and other protein foods  All fresh beef, pork, poultry, fish, seafood, and eggs. Fish canned in water, oil, brine, or vegetable broth. Plain nuts and seeds, peanut butter. Some lunch meat and some frankfurters. Dried beans, dried peas, and lentils. Dairy  Fresh plain, dry, evaporated, or condensed milk. Cream, butter, sour cream, whipping cream, and most yogurts. Unprocessed cheese, most processed cheeses, some cottage cheese, some cream cheeses. Beverages  Coffee, tea, most herbal teas. Carbonated beverages and some root beers.   Wine, sake, and distilled spirits, such as gin, vodka, and whiskey. Most hard ciders. Fats and oils  Butter, margarine, vegetable oil, hydrogenated butter, olive  oil, shortening, lard, cream, and some mayonnaise. Some commercial salad dressings. Olives. Sweets and desserts  Sugar, honey, some syrups, molasses, jelly, and jam. Plain hard candy, marshmallows, and gumdrops. Pure cocoa powder. Plain chocolate. Custard and some pudding mixes. Gelatin desserts, sorbets, frozen ice pops, and sherbet. Cake, cookies, and other desserts prepared with allowed flours. Some commercial ice creams. Cornstarch, tapioca, and rice puddings. Seasoning and other foods  Some canned or frozen soups. Monosodium glutamate (MSG). Cider, rice, and wine vinegar. Baking soda and baking powder. Cream of tartar. Baking and nutritional yeast. Certain soy sauces made without wheat (ask your dietitian about specific brands that are allowed). Nuts, coconut, and chocolate. Salt, pepper, herbs, spices, flavoring extracts, imitation or artificial flavorings, natural flavorings, and food colorings. Some medicines and supplements. Some lip glosses and other cosmetics. Rice syrups. The items listed may not be a complete list. Talk with your dietitian about what dietary choices are best for you. Foods to avoid Grains  Barley, bran, bulgur, couscous, cracked wheat, [place], farro, graham, malt, matzo, semolina, wheat germ, and all wheat and rye cereals including spelt and kamut. Cereals containing malt as a flavoring, such as rice cereal. Noodles, spaghetti, macaroni, most packaged rice mixes, and all mixes containing wheat, rye, barley, or triticale. Vegetables  Most creamed vegetables and most vegetables canned in sauces. Some commercially prepared vegetables and salads. Fruits  Thickened or prepared fruits and some pie fillings. Some fruit snacks and fruit roll-ups. Meats and other protein foods  Any meat or meat alternative containing wheat, rye, barley, or gluten stabilizers. These are often marinated or packaged meats and lunch meats. Bread-containing products, such as Swiss steak,  croquettes, meatballs, and meatloaf. Most tuna canned in vegetable broth and turkey with hydrolyzed vegetable protein (HVP) injected as part of the basting. Seitan. Imitation fish. Eggs in sauces made from ingredients to avoid. Dairy  Commercial chocolate milk drinks and malted milk. Some non-dairy creamers. Any cheese product containing ingredients to avoid. Beverages  Certain cereal beverages. Beer, ale, malted milk, and some root beers. Some hard ciders. Some instant flavored coffees. Some herbal teas made with barley or with barley malt added. Fats and oils  Some commercial salad dressings. Sour cream containing modified food starch. Sweets and desserts  Some toffees. Chocolate-coated nuts (may be rolled in wheat flour) and some commercial candies and candy bars. Most cakes, cookies, donuts, pastries, and other baked goods. Some commercial ice cream. Ice cream cones. Commercially prepared mixes for cakes, cookies, and other desserts. Bread pudding and other puddings thickened with flour. Products containing brown rice syrup made with barley malt enzyme. Desserts and sweets made with malt flavoring. Seasoning and other foods  Some curry powders, some dry seasoning mixes, some gravy extracts, some meat sauces, some ketchups, some prepared mustards, and horseradish. Certain soy sauces. Malt vinegar. Bouillon and bouillon cubes that contain HVP. Some chip dips, and some chewing gum. Yeast extract. Brewer's yeast. Caramel color. Some medicines and supplements. Some lip glosses and other cosmetics. The items listed may not be a complete list. Talk with your dietitian about what dietary choices are best for you. Summary  Gluten is a protein that is found in wheat, rye, barley, and some other grains. The gluten-free diet includes all foods that do not contain gluten.  If you need help finding gluten-free foods or if   you have questions, talk with your diet and nutrition specialist (registered  dietitian) or your health care provider.  Read all food labels. Gluten is often added to foods. Always check the ingredient list and look for warnings, such as "may contain gluten." This information is not intended to replace advice given to you by your health care provider. Make sure you discuss any questions you have with your health care provider. Document Released: 05/13/2005 Document Revised: 02/26/2016 Document Reviewed: 02/26/2016 Elsevier Interactive Patient Education  2018 Elsevier Inc.   

## 2022-12-02 NOTE — Progress Notes (Signed)
Subjective:  Patient ID: Redge Gainer, female    DOB: 05-Mar-1946  Age: 77 y.o. MRN: [id number]  CC: Follow-up (Concerns of IBS or some type of stomach issue due constant nausea and irregular bowel movements/)   HPI Lisaanne Butler presents for IBS sx's: nausea, diarrhea and constipation; bloating and gas. Nausea starting every AM. Smoking does not help w/GI sx's... Flagyl, Xifaxan did not help... C/o fatigue...strress at home; husband w/rectal cancer s/p XRT - better... F/u HTN Smoker  Outpatient Medications Prior to Visit  Medication Sig Dispense Refill   amLODipine (NORVASC) 5 MG tablet TAKE 1 TABLET(5 MG) BY MOUTH DAILY 90 tablet 3   carvedilol (COREG) 6.25 MG tablet Take 1 tablet (6.25 mg total) by mouth 2 (two) times daily with a meal. 60 tablet 11   glucose blood (ACCU-CHEK GUIDE) test strip Use to check blood sugars once a day 100 each 3   glucose blood test strip Use as instructed 100 each 12   levothyroxine (SYNTHROID) 88 MCG tablet Take 1 tablet (88 mcg total) by mouth daily before breakfast. Annual appt due must see provider for future refills 30 tablet 0   metoCLOPramide (REGLAN) 5 MG tablet Take 1 tablet (5 mg total) by mouth 3 (three) times daily before meals. 90 tablet 1   ondansetron (ZOFRAN) 4 MG tablet Take 1 tablet (4 mg total) by mouth every 6 (six) hours. 40 tablet 3   pantoprazole (PROTONIX) 40 MG tablet TAKE 1 TABLET(40 MG) BY MOUTH TWICE DAILY 60 tablet 3   repaglinide (PRANDIN) 0.5 MG tablet TAKE 1 TABLET(0.5 MG) BY MOUTH THREE TIMES DAILY BEFORE MEALS 270 tablet 1   simvastatin (ZOCOR) 10 MG tablet Take 1 tablet (10 mg total) by mouth at bedtime. 90 tablet 3   fluticasone (FLONASE) 50 MCG/ACT nasal spray Place 2 sprays into both nostrils daily. (Patient not taking: Reported on 08/27/2022)     loratadine (CLARITIN) 10 MG tablet Take 1 tablet (10 mg total) by mouth daily. (Patient not taking: Reported on 08/27/2022) 90 tablet 3   omeprazole (PRILOSEC) 40 MG capsule  Take 1 capsule (40 mg total) by mouth daily. Before breakfast (Patient not taking: Reported on 08/27/2022) 30 capsule 11   spironolactone (ALDACTONE) 25 MG tablet Take 1 tablet (25 mg total) by mouth daily. (Patient not taking: Reported on 08/27/2022) 90 tablet 3   phentermine (ADIPEX-P) 37.5 MG tablet Take 1 tablet (37.5 mg total) by mouth daily before breakfast. (Patient not taking: Reported on 06/11/2022) 30 tablet 2   No facility-administered medications prior to visit.    ROS: Review of Systems  Constitutional:  Negative for activity change, appetite change, chills, fatigue and unexpected weight change.  HENT:  Negative for congestion, mouth sores and sinus pressure.   Eyes:  Negative for visual disturbance.  Respiratory:  Negative for cough and chest tightness.   Gastrointestinal:  Positive for abdominal pain and nausea.  Genitourinary:  Negative for difficulty urinating, frequency and vaginal pain.  Musculoskeletal:  Negative for back pain and gait problem.  Skin:  Negative for pallor and rash.  Neurological:  Negative for dizziness, tremors, weakness, numbness and headaches.  Psychiatric/Behavioral:  Negative for confusion and sleep disturbance.     Objective:  BP 122/80 (BP Location: Left Arm, Patient Position: Sitting, Cuff Size: Large)   Pulse (!) 50   Temp 98.4 F (36.9 C) (Oral)   Ht 5\' 1"  (1.549 m)   Wt 151 lb (68.5 kg)   SpO2 93%   BMI 28.53  kg/m   BP Readings from Last 3 Encounters:  12/02/22 122/80  10/22/22 122/68  08/27/22 (!) 153/100    Wt Readings from Last 3 Encounters:  12/02/22 151 lb (68.5 kg)  10/22/22 158 lb (71.7 kg)  08/27/22 159 lb (72.1 kg)    Physical Exam Constitutional:      General: She is not in acute distress.    Appearance: She is well-developed. She is obese.  HENT:     Head: Normocephalic.     Right Ear: External ear normal.     Left Ear: External ear normal.     Nose: Nose normal.  Eyes:     General:        Right eye: No  discharge.        Left eye: No discharge.     Conjunctiva/sclera: Conjunctivae normal.     Pupils: Pupils are equal, round, and reactive to light.  Neck:     Thyroid: No thyromegaly.     Vascular: No JVD.     Trachea: No tracheal deviation.  Cardiovascular:     Rate and Rhythm: Normal rate and regular rhythm.     Heart sounds: Normal heart sounds.  Pulmonary:     Effort: No respiratory distress.     Breath sounds: No stridor. No wheezing.  Abdominal:     General: Bowel sounds are normal. There is no distension.     Palpations: Abdomen is soft. There is no mass.     Tenderness: There is no abdominal tenderness. There is no guarding or rebound.  Musculoskeletal:        General: No tenderness.     Cervical back: Normal range of motion and neck supple. No rigidity.  Lymphadenopathy:     Cervical: No cervical adenopathy.  Skin:    Findings: No erythema or rash.  Neurological:     Cranial Nerves: No cranial nerve deficit.     Motor: No abnormal muscle tone.     Coordination: Coordination normal.     Deep Tendon Reflexes: Reflexes normal.  Psychiatric:        Behavior: Behavior normal.        Thought Content: Thought content normal.        Judgment: Judgment normal.     Lab Results  Component Value Date   WBC 10.2 08/13/2022   HGB 13.4 08/13/2022   HCT 42.9 08/13/2022   PLT 218 08/13/2022   GLUCOSE 198 (H) 08/13/2022   CHOL 216 (H) 03/18/2018   TRIG 125 03/18/2018   HDL 58 03/18/2018   LDLCALC 133 (H) 03/18/2018   ALT 15 05/07/2022   AST 18 05/07/2022   NA 137 08/13/2022   K 3.6 08/13/2022   CL 106 08/13/2022   CREATININE 0.94 08/13/2022   BUN 25 (H) 08/13/2022   CO2 25 08/13/2022   TSH 9.60 (H) 09/17/2021   HGBA1C 6.6 (H) 05/07/2022    VAS US CAROTID  Result Date: 08/26/2022 Carotid Arterial Duplex Study Patient Name:  KALAYNA DARPINO  Date of Exam:   08/26/2022 Medical Rec #: [id number]       Accession #:    [id number] Date of Birth: [birth year]      Patient Gender:  F Patient Age:   47 years Exam Location:  [facility] Procedure:      VAS US CAROTID Referring Phys: Sherald Hess --------------------------------------------------------------------------------  Indications:       Carotid artery disease. Comparison Study:  CTA 08/13/22: Right <50% stenosis. Left ICA stenosis  estimated                    at 75%, approaching a RADIOGRAPHIC STRING SIGN. Performing Technologist: Thereasa Parkin RVT  Examination Guidelines: A complete evaluation includes B-mode imaging, spectral Doppler, color Doppler, and power Doppler as needed of all accessible portions of each vessel. Bilateral testing is considered an integral part of a complete examination. Limited examinations for reoccurring indications may be performed as noted.  Right Carotid Findings: +----------+--------+--------+--------+------------------+--------+           PSV cm/sEDV cm/sStenosisPlaque DescriptionComments +----------+--------+--------+--------+------------------+--------+ CCA Prox  50      11                                         +----------+--------+--------+--------+------------------+--------+ CCA Mid   64      14                                         +----------+--------+--------+--------+------------------+--------+ CCA Distal64      14                                         +----------+--------+--------+--------+------------------+--------+ ICA Prox  41      9       1-39%   heterogenous               +----------+--------+--------+--------+------------------+--------+ ICA Mid   43      15                                         +----------+--------+--------+--------+------------------+--------+ ICA Distal50      14                                         +----------+--------+--------+--------+------------------+--------+ ECA       63      11                                          +----------+--------+--------+--------+------------------+--------+ +----------+--------+-------+----------------+-------------------+           PSV cm/sEDV cmsDescribe        Arm Pressure (mmHG) +----------+--------+-------+----------------+-------------------+ Subclavian61      0      Multiphasic, [id number]                 +----------+--------+-------+----------------+-------------------+ +---------+--------+--+--------+--+---------+ VertebralPSV cm/s59EDV cm/s12Antegrade +---------+--------+--+--------+--+---------+  Left Carotid Findings: +----------+--------+--------+--------+----------------------------+--------+           PSV cm/sEDV cm/sStenosisPlaque Description          Comments +----------+--------+--------+--------+----------------------------+--------+ CCA Prox  74      12                                                   +----------+--------+--------+--------+----------------------------+--------+ CCA Mid   66  12                                                   +----------+--------+--------+--------+----------------------------+--------+ CCA Distal58      11              homogeneous and heterogenous         +----------+--------+--------+--------+----------------------------+--------+ ICA Prox  172     49      40-59%  calcific                             +----------+--------+--------+--------+----------------------------+--------+ ICA Mid   90      17                                                   +----------+--------+--------+--------+----------------------------+--------+ ICA Distal69      17                                                   +----------+--------+--------+--------+----------------------------+--------+ ECA       44      7                                                    +----------+--------+--------+--------+----------------------------+--------+  +----------+--------+--------+----------------+-------------------+           PSV cm/sEDV cm/sDescribe        Arm Pressure (mmHG) +----------+--------+--------+----------------+-------------------+ [id number]     0       Multiphasic, [id number]                 +----------+--------+--------+----------------+-------------------+ +---------+--------+--+--------+-+---------+ VertebralPSV cm/s23EDV cm/s5Antegrade +---------+--------+--+--------+-+---------+   Summary: Right Carotid: Velocities in the right ICA are consistent with a 1-39% stenosis. Left Carotid: Velocities in the left ICA are consistent with a 40-59% stenosis. Vertebrals:  Bilateral vertebral arteries demonstrate antegrade flow. Subclavians: Normal flow hemodynamics were seen in bilateral subclavian              arteries. *See table(s) above for measurements and observations.  Electronically signed by Sherald Hess MD on 08/26/2022 at 1:28:40 PM.    Final     Assessment & Plan:   Problem List Items Addressed This Visit     Nausea without vomiting    IBS? Empiric Flagyl x 10 d Align x 1 month Librax prn      Relevant Orders   Ambulatory referral to Gastroenterology   Comprehensive metabolic panel   TSH   Hemoglobin A1c   CBC with Differential/Platelet   Hyponatremia    Check labs      Abdominal bloating    Not better IBS type sx's vs other: nausea, diarrhea and constipation; bloating and gas. Nausea starting every AM. Smoking does not help w/GI sx's... Flagyl, Xifaxan did not help... Will try: Align x 1 month Librax prn GF diet  Gi Ref w/Amy Esterwood      Relevant Orders  Ambulatory referral to Gastroenterology   Comprehensive metabolic panel   TSH   Hemoglobin A1c   CBC with Differential/Platelet   S/P Nissen fundoplication (without gastrostomy tube) procedure - Primary    Vomiting resolved w/surgery, still nauseated IBS type sx's vs other: nausea, diarrhea and constipation; bloating and  gas. Nausea starting every AM. Smoking does not help w/GI sx's... Flagyl, Xifaxan did not help... Will try: Align x 1 month Librax prn GF diet Gi Ref w/Amy Esterwood      Relevant Orders   Ambulatory referral to Gastroenterology   CRI (chronic renal insufficiency), stage 3 (moderate) (HCC)    Monitor GFR Hydrate well      Relevant Orders   Comprehensive metabolic panel   TSH   Hemoglobin A1c   CBC with Differential/Platelet   Carotid artery disease (HCC)    On Simvastatin - no new GI sx's w/it Pt continues to smoke - discussed      Smoker     Pt continues to smoke - discussed      Fatigue    ?CFS...strress at home; husband w/rectal cancer s/p XRT - better... Check labs - see orders      Relevant Orders   TSH   Other Visit Diagnoses     Hyperglycemia       Relevant Orders   Hemoglobin A1c         Meds ordered this encounter  Medications   clidinium-chlordiazePOXIDE (LIBRAX) 5-2.5 MG capsule    Sig: Take 1 capsule by mouth 4 (four) times daily -  before meals and at bedtime.    Dispense:  60 capsule    Refill:  3      Follow-up: Return in about 6 weeks (around 01/13/2023) for a follow-up visit.  Sonda Primes, MD

## 2022-12-05 ENCOUNTER — Telehealth: Payer: Self-pay | Admitting: *Deleted

## 2022-12-05 NOTE — Telephone Encounter (Signed)
Rec' d fax pt needing PA on Chloridiazepoxide/clidinum. Called [phone] spoke w/ rep Antonietta Jewel. Gave clinical answers. Received anther clinical questionnaire this morning completed and faxed back to [facility].Marland KitchenRaechel Chute

## 2022-12-09 NOTE — Telephone Encounter (Signed)
Check status on PA med has been approved faxed approval back to [facility]../lm,b

## 2022-12-10 DIAGNOSIS — M545 Low back pain, unspecified: Secondary | ICD-10-CM | POA: Diagnosis not present

## 2022-12-10 DIAGNOSIS — M79671 Pain in right foot: Secondary | ICD-10-CM | POA: Diagnosis not present

## 2022-12-10 DIAGNOSIS — M79672 Pain in left foot: Secondary | ICD-10-CM | POA: Diagnosis not present

## 2022-12-10 DIAGNOSIS — M25511 Pain in right shoulder: Secondary | ICD-10-CM | POA: Diagnosis not present

## 2022-12-11 ENCOUNTER — Other Ambulatory Visit: Payer: Self-pay | Admitting: Internal Medicine

## 2022-12-12 DIAGNOSIS — I700 Atherosclerosis of aorta: Secondary | ICD-10-CM | POA: Insufficient documentation

## 2022-12-12 DIAGNOSIS — M5136 Other intervertebral disc degeneration, lumbar region: Secondary | ICD-10-CM | POA: Diagnosis not present

## 2022-12-12 DIAGNOSIS — M25511 Pain in right shoulder: Secondary | ICD-10-CM | POA: Diagnosis not present

## 2022-12-13 ENCOUNTER — Other Ambulatory Visit (HOSPITAL_COMMUNITY): Payer: Self-pay

## 2022-12-22 DIAGNOSIS — M545 Low back pain, unspecified: Secondary | ICD-10-CM | POA: Diagnosis not present

## 2022-12-22 DIAGNOSIS — M25511 Pain in right shoulder: Secondary | ICD-10-CM | POA: Diagnosis not present

## 2022-12-31 ENCOUNTER — Other Ambulatory Visit: Payer: Self-pay | Admitting: Internal Medicine

## 2022-12-31 ENCOUNTER — Telehealth: Payer: Self-pay | Admitting: *Deleted

## 2022-12-31 DIAGNOSIS — M5416 Radiculopathy, lumbar region: Secondary | ICD-10-CM | POA: Diagnosis not present

## 2022-12-31 DIAGNOSIS — M545 Low back pain, unspecified: Secondary | ICD-10-CM | POA: Diagnosis not present

## 2022-12-31 DIAGNOSIS — M25511 Pain in right shoulder: Secondary | ICD-10-CM | POA: Diagnosis not present

## 2022-12-31 DIAGNOSIS — M5136 Other intervertebral disc degeneration, lumbar region: Secondary | ICD-10-CM | POA: Diagnosis not present

## 2022-12-31 NOTE — Telephone Encounter (Signed)
Daughter states mom appt for colonoscopy is not in till Sept. She is wanting to know can MD order MRI since pt in so much pain.Marland KitchenRaechel Chute

## 2023-01-01 DIAGNOSIS — M21619 Bunion of unspecified foot: Secondary | ICD-10-CM | POA: Insufficient documentation

## 2023-01-01 DIAGNOSIS — M75120 Complete rotator cuff tear or rupture of unspecified shoulder, not specified as traumatic: Secondary | ICD-10-CM | POA: Insufficient documentation

## 2023-01-01 DIAGNOSIS — M79671 Pain in right foot: Secondary | ICD-10-CM | POA: Diagnosis not present

## 2023-01-01 DIAGNOSIS — M79672 Pain in left foot: Secondary | ICD-10-CM | POA: Diagnosis not present

## 2023-01-01 DIAGNOSIS — M2041 Other hammer toe(s) (acquired), right foot: Secondary | ICD-10-CM | POA: Diagnosis not present

## 2023-01-01 NOTE — Telephone Encounter (Signed)
Called pt/daughter no answer LMOM w/MD response...Susan Keith

## 2023-01-01 NOTE — Telephone Encounter (Signed)
The patient just had an abdominal CT in March 2024. They need to ask GI to put her on a cancellation list for a sooner appointment.  Thank you

## 2023-01-02 DIAGNOSIS — M75121 Complete rotator cuff tear or rupture of right shoulder, not specified as traumatic: Secondary | ICD-10-CM | POA: Diagnosis not present

## 2023-01-09 ENCOUNTER — Encounter (INDEPENDENT_AMBULATORY_CARE_PROVIDER_SITE_OTHER): Payer: Self-pay

## 2023-01-14 ENCOUNTER — Telehealth: Payer: Self-pay | Admitting: Internal Medicine

## 2023-01-14 NOTE — Telephone Encounter (Signed)
Patient's daughter called and said the form for Section 8 was received and it was not enough according to the people working there. She said they told her they are going to re-send the form to be filled out again. Lu Duffel would like a call back at [phone].

## 2023-01-14 NOTE — Telephone Encounter (Signed)
MD has fill out the patient work twice. Both times they have denied the second bedroom.Marland KitchenRaechel Keith

## 2023-01-14 NOTE — Telephone Encounter (Signed)
Patient daughter is asking that more details be added to the form, explaining why the colostomy bag would require her father to have a separate bed and bedroom, as he cannot be touched and move throughout the night. She asked if this could be added to the form already completed and if not there is supposed to be another form coming. She asked if she could be given a call before form is submitted to go over the details added. Best callback is [phone], states she is not available during 1-2pm.

## 2023-01-17 NOTE — Telephone Encounter (Signed)
Okay to add info to the form.  Thank you

## 2023-01-17 NOTE — Telephone Encounter (Signed)
Faxed back to [employer]Gordan Payment

## 2023-01-21 ENCOUNTER — Telehealth: Payer: Self-pay | Admitting: Internal Medicine

## 2023-01-21 DIAGNOSIS — R143 Flatulence: Secondary | ICD-10-CM | POA: Diagnosis not present

## 2023-01-21 NOTE — Telephone Encounter (Signed)
Pt daughter called wanting to speak with  a nurse about correction on a section 8 form for her mother. Please advise.

## 2023-01-25 DIAGNOSIS — K5900 Constipation, unspecified: Secondary | ICD-10-CM | POA: Diagnosis not present

## 2023-01-25 DIAGNOSIS — R197 Diarrhea, unspecified: Secondary | ICD-10-CM | POA: Diagnosis not present

## 2023-01-25 DIAGNOSIS — R143 Flatulence: Secondary | ICD-10-CM | POA: Diagnosis not present

## 2023-01-25 DIAGNOSIS — R140 Abdominal distension (gaseous): Secondary | ICD-10-CM | POA: Diagnosis not present

## 2023-01-28 NOTE — Telephone Encounter (Signed)
I spoke with pts daughter about forms. I was able to locate forms and that forms where updated already once. I was bale to inform pts daughter that a new form is needed to put updated reasoning with clarity as the old form would not be appropriate to update at this time.  Pts daughter has stated she will try to get new form and drop it off to the office.

## 2023-02-20 ENCOUNTER — Telehealth: Payer: Self-pay | Admitting: Internal Medicine

## 2023-02-20 ENCOUNTER — Ambulatory Visit: Payer: Medicare Other | Admitting: Physician Assistant

## 2023-02-20 NOTE — Telephone Encounter (Signed)
Patient's daughter wants to know if there is another GI doctor that can see her mom because her mom is suffering everyday. States that she will keep the GI appointment that she has in November if her mom can't be seen sooner. Patients daughter wants to know can you refill her mothers antibiotic medication that was prescribed by another doctor that's out of state until her mom sees GI. Metronidazole 250mg  TID for 14 days. Xifaxan 550mg  TID for 14 days.

## 2023-02-20 NOTE — Telephone Encounter (Signed)
Patient's daughter called and said patient was referred to a gastroenterologist. The office canceled on patient and they would like to know if she can be referred somewhere else. Kennyth Lose would like a call back at [phone].

## 2023-02-20 NOTE — Telephone Encounter (Signed)
Patient needs to discuss her GI appointment being cancelled until November, 2024.  She is in need of an antibiotic that another doctor has said that she needed.  Please call daughter at:  [phone]

## 2023-02-20 NOTE — Telephone Encounter (Signed)
I would keep November appt w/Dr Marina Goodell. Thx

## 2023-02-21 ENCOUNTER — Other Ambulatory Visit: Payer: Self-pay | Admitting: Internal Medicine

## 2023-02-21 ENCOUNTER — Other Ambulatory Visit: Payer: Self-pay | Admitting: Radiology

## 2023-02-21 MED ORDER — METRONIDAZOLE 500 MG PO TABS: 500.0000 mg | ORAL_TABLET | Freq: Three times a day (TID) | ORAL | 0 refills | Status: DC

## 2023-02-21 MED ORDER — ALIGN 4 MG PO CAPS: 1.0000 | ORAL_CAPSULE | Freq: Every day | ORAL | 1 refills | Status: DC

## 2023-02-21 NOTE — Telephone Encounter (Signed)
Patient's daughter Susan Keith has questions about the medication that was sent in. She said the metroNIDAZOLE (FLAGYL) was supposed to be 250 mg, but 500 mg was sent in. She also wants to know about her request for Xifaxan. Susan Keith would like a call back at [phone].

## 2023-02-21 NOTE — Telephone Encounter (Signed)
No need to take both antibiotics together.  Will can start with metronidazole.  I will send the prescription in.  Take Align probiotic after you finish metronidazole

## 2023-02-24 NOTE — Telephone Encounter (Signed)
Patients's daughter would like to know why you only sent 1 antibiotic and a probiotic rather than both antibiotics.

## 2023-02-24 NOTE — Telephone Encounter (Signed)
Patient's daughter would like to know why only one antibiotic was able to be prescribed. She would like to speak with a nurse about it. Best callback for Susan Keith is [phone].

## 2023-02-25 NOTE — Telephone Encounter (Signed)
Well, because I am a doctor, I guess.  This is the end of this conversation.  Thank you

## 2023-02-26 ENCOUNTER — Telehealth: Payer: Self-pay | Admitting: Internal Medicine

## 2023-02-26 NOTE — Telephone Encounter (Signed)
Patient's daughter called and wants to know if you can order blood test for her mom that would identify any cancer markers she might have.  Please call [phone]

## 2023-02-27 NOTE — Telephone Encounter (Signed)
We can order markers for some cancers, however, the tests will not be covered by the patient's insurance.  Thanks

## 2023-02-28 NOTE — Telephone Encounter (Signed)
Pts daughter has stated she was informed by the GI doctor that pt needs to be on the following medications for 2 weeks metroNIDAZOLE (FLAGYL) and Xifaxan. She asked that PCP please send in the Xifaxan.

## 2023-03-04 NOTE — Telephone Encounter (Signed)
Patient's daughter called and wanted to know why her mother can't have the second antibiotic. She is very upset because the specialist had the patient do both. Lu Duffel was told that Dr. Posey Rea would not do both at the same time. She got very upset and hung up.

## 2023-03-05 NOTE — Telephone Encounter (Signed)
Noted. Thx.

## 2023-03-11 DIAGNOSIS — M2041 Other hammer toe(s) (acquired), right foot: Secondary | ICD-10-CM | POA: Diagnosis not present

## 2023-03-11 DIAGNOSIS — M21611 Bunion of right foot: Secondary | ICD-10-CM | POA: Diagnosis not present

## 2023-04-03 ENCOUNTER — Ambulatory Visit: Payer: Medicare Other | Admitting: Internal Medicine

## 2023-05-05 DIAGNOSIS — M2041 Other hammer toe(s) (acquired), right foot: Secondary | ICD-10-CM | POA: Diagnosis not present

## 2023-05-05 DIAGNOSIS — M21619 Bunion of unspecified foot: Secondary | ICD-10-CM | POA: Diagnosis not present

## 2023-05-05 DIAGNOSIS — Z4889 Encounter for other specified surgical aftercare: Secondary | ICD-10-CM | POA: Diagnosis not present

## 2023-05-20 NOTE — Progress Notes (Signed)
This encounter was created in error - please disregard. I called patient and her daughter stated that she called the office to reschedule visit, as her mother was not available today to do this visit.

## 2023-06-09 ENCOUNTER — Ambulatory Visit: Payer: Medicare Other | Admitting: Internal Medicine

## 2023-06-09 DIAGNOSIS — M2041 Other hammer toe(s) (acquired), right foot: Secondary | ICD-10-CM | POA: Diagnosis not present

## 2023-06-09 DIAGNOSIS — M21619 Bunion of unspecified foot: Secondary | ICD-10-CM | POA: Diagnosis not present

## 2023-06-09 DIAGNOSIS — Z4889 Encounter for other specified surgical aftercare: Secondary | ICD-10-CM | POA: Diagnosis not present

## 2023-06-17 ENCOUNTER — Other Ambulatory Visit: Payer: Self-pay | Admitting: Internal Medicine

## 2023-06-30 ENCOUNTER — Ambulatory Visit: Payer: Medicare Other | Admitting: Internal Medicine

## 2023-07-07 ENCOUNTER — Ambulatory Visit: Payer: Medicare Other | Admitting: Internal Medicine

## 2023-07-22 ENCOUNTER — Other Ambulatory Visit: Payer: Self-pay | Admitting: Internal Medicine

## 2023-07-22 NOTE — Telephone Encounter (Signed)
 Copied from CRM [id number]. Topic: Clinical - Medication Refill >> Jul 22, 2023 11:44 AM [place] J wrote: Most Recent Primary Care Visit:  Provider: Tresa Garter  Department: [facility]  Visit Type: OFFICE VISIT  Date: 12/02/2022  Medication: Lisinopril   Has the patient contacted their pharmacy? Yes (Agent: If no, request that the patient contact the pharmacy for the refill. If patient does not wish to contact the pharmacy document the reason why and proceed with request.) (Agent: If yes, when and what did the pharmacy advise?)  Is this the correct pharmacy for this prescription? Yes If no, delete pharmacy and type the correct one.  This is the patient's preferred pharmacy:  [id number] [place], [place] - [address] AT [facility] [address] [place] [address] Phone: [phone] Fax: [phone]   Has the prescription been filled recently? No  Is the patient out of the medication? Yes  Has the patient been seen for an appointment in the last year OR does the patient have an upcoming appointment? Yes  Can we respond through MyChart? Yes  Agent: Please be advised that Rx refills may take up to 3 business days. We ask that you follow-up with your pharmacy.

## 2023-07-22 NOTE — Telephone Encounter (Signed)
 Last Fill: Unknown  Last OV: 05/07/22 Next OV: None Scheduled  Routing to provider for review/authorization.

## 2023-08-05 ENCOUNTER — Ambulatory Visit: Admitting: Internal Medicine

## 2023-08-05 ENCOUNTER — Encounter: Payer: Self-pay | Admitting: Internal Medicine

## 2023-08-05 VITALS — BP 124/74 | HR 79 | Temp 98.6°F | Ht 61.0 in | Wt 161.0 lb

## 2023-08-05 DIAGNOSIS — Z7984 Long term (current) use of oral hypoglycemic drugs: Secondary | ICD-10-CM | POA: Diagnosis not present

## 2023-08-05 DIAGNOSIS — I10 Essential (primary) hypertension: Secondary | ICD-10-CM

## 2023-08-05 DIAGNOSIS — R7303 Prediabetes: Secondary | ICD-10-CM

## 2023-08-05 DIAGNOSIS — Z720 Tobacco use: Secondary | ICD-10-CM | POA: Diagnosis not present

## 2023-08-05 DIAGNOSIS — I6523 Occlusion and stenosis of bilateral carotid arteries: Secondary | ICD-10-CM

## 2023-08-05 DIAGNOSIS — N183 Chronic kidney disease, stage 3 unspecified: Secondary | ICD-10-CM

## 2023-08-05 DIAGNOSIS — G43109 Migraine with aura, not intractable, without status migrainosus: Secondary | ICD-10-CM | POA: Diagnosis not present

## 2023-08-05 DIAGNOSIS — E1169 Type 2 diabetes mellitus with other specified complication: Secondary | ICD-10-CM

## 2023-08-05 DIAGNOSIS — R110 Nausea: Secondary | ICD-10-CM

## 2023-08-05 LAB — HEMOGLOBIN A1C: Hgb A1c MFr Bld: 6.7 % — ABNORMAL HIGH (ref 4.6–6.5)

## 2023-08-05 LAB — CBC WITH DIFFERENTIAL/PLATELET
Basophils Absolute: 0 10*3/uL (ref 0.0–0.1)
Basophils Relative: 0.4 % (ref 0.0–3.0)
Eosinophils Absolute: 0.2 10*3/uL (ref 0.0–0.7)
Eosinophils Relative: 2 % (ref 0.0–5.0)
HCT: 44.5 % (ref 36.0–46.0)
Hemoglobin: 14.5 g/dL (ref 12.0–15.0)
Lymphocytes Relative: 27.6 % (ref 12.0–46.0)
Lymphs Abs: 2.2 10*3/uL (ref 0.7–4.0)
MCHC: 32.4 g/dL (ref 30.0–36.0)
MCV: 92.3 fl (ref 78.0–100.0)
Monocytes Absolute: 0.5 10*3/uL (ref 0.1–1.0)
Monocytes Relative: 6.4 % (ref 3.0–12.0)
Neutro Abs: 5.1 10*3/uL (ref 1.4–7.7)
Neutrophils Relative %: 63.6 % (ref 43.0–77.0)
Platelets: 261 10*3/uL (ref 150.0–400.0)
RBC: 4.83 Mil/uL (ref 3.87–5.11)
RDW: 14.9 % (ref 11.5–15.5)
WBC: 8 10*3/uL (ref 4.0–10.5)

## 2023-08-05 LAB — COMPREHENSIVE METABOLIC PANEL
ALT: 25 U/L (ref 0–35)
AST: 26 U/L (ref 0–37)
Albumin: 4.6 g/dL (ref 3.5–5.2)
Alkaline Phosphatase: 104 U/L (ref 39–117)
BUN: 21 mg/dL (ref 6–23)
CO2: 29 meq/L (ref 19–32)
Calcium: 9.6 mg/dL (ref 8.4–10.5)
Chloride: 103 meq/L (ref 96–112)
Creatinine, Ser: 0.79 mg/dL (ref 0.40–1.20)
GFR: 73.19 mL/min (ref >60.00)
Glucose, Bld: 117 mg/dL — ABNORMAL HIGH (ref 70–99)
Potassium: 4.8 meq/L (ref 3.5–5.1)
Sodium: 140 meq/L (ref 135–145)
Total Bilirubin: 0.5 mg/dL (ref 0.2–1.2)
Total Protein: 7.7 g/dL (ref 6.0–8.3)

## 2023-08-05 LAB — T4, FREE: Free T4: 0.78 ng/dL (ref 0.60–1.60)

## 2023-08-05 LAB — TSH: TSH: 5.62 u[IU]/mL — ABNORMAL HIGH (ref 0.35–5.50)

## 2023-08-05 MED ORDER — LOSARTAN POTASSIUM 50 MG PO TABS: 50.0000 mg | ORAL_TABLET | Freq: Every day | ORAL | 3 refills | Status: DC

## 2023-08-05 MED ORDER — ACCU-CHEK GUIDE TEST VI STRP: ORAL_STRIP | 11 refills | Status: AC

## 2023-08-05 NOTE — Assessment & Plan Note (Signed)
On Simvastatin - no new GI sx's w/it Pt continues to smoke - discussed

## 2023-08-05 NOTE — Assessment & Plan Note (Signed)
Try Phentermine Loose wt Try apple cider vinegar

## 2023-08-05 NOTE — Assessment & Plan Note (Signed)
Maxalt prn, Tylenol prn

## 2023-08-05 NOTE — Assessment & Plan Note (Signed)
Monitor GFR Hydrate well 

## 2023-08-05 NOTE — Assessment & Plan Note (Signed)
On Prandin to 1 mg with meals.

## 2023-08-05 NOTE — Assessment & Plan Note (Signed)
 On Lisinopril, Aldactone, Amlodipine D/c Lisinopril (?side effects)- start Losartan instead

## 2023-08-05 NOTE — Assessment & Plan Note (Signed)
::   Smoking:

## 2023-08-05 NOTE — Progress Notes (Signed)
 Subjective:  Patient ID: Susan Keith, female    DOB: 09/03/1945  Age: 78 y.o. MRN: [id number]  CC: Medication Refill (Pt would like to discuss medication Lisinopril )   HPI Susan Keith presents for HTN, DM, hypothyroidism  Outpatient Medications Prior to Visit  Medication Sig Dispense Refill   amLODipine (NORVASC) 5 MG tablet TAKE 1 TABLET(5 MG) BY MOUTH DAILY 90 tablet 3   carvedilol (COREG) 6.25 MG tablet Take 1 tablet (6.25 mg total) by mouth 2 (two) times daily with a meal. 60 tablet 11   celecoxib (CELEBREX) 200 MG capsule Take 200 mg by mouth 2 (two) times daily.     clidinium-chlordiazePOXIDE (LIBRAX) 5-2.5 MG capsule Take 1 capsule by mouth 4 (four) times daily -  before meals and at bedtime. 60 capsule 3   gabapentin (NEURONTIN) 100 MG capsule Take 100 mg by mouth 2 (two) times daily.     levothyroxine (SYNTHROID) 88 MCG tablet TAKE 1 TABLET BY MOUTH DAILY BEFORE BREAKFAST. 90 tablet 3   metroNIDAZOLE (FLAGYL) 500 MG tablet Take 1 tablet (500 mg total) by mouth 3 (three) times daily. 30 tablet 0   ondansetron (ZOFRAN) 4 MG tablet Take 1 tablet (4 mg total) by mouth every 6 (six) hours. 40 tablet 3   pantoprazole (PROTONIX) 40 MG tablet TAKE 1 TABLET(40 MG) BY MOUTH TWICE DAILY 60 tablet 3   Probiotic Product (ALIGN) 4 MG CAPS Take 1 capsule (4 mg total) by mouth daily. 30 capsule 1   repaglinide (PRANDIN) 0.5 MG tablet TAKE 1 TABLET(0.5 MG) BY MOUTH THREE TIMES DAILY BEFORE MEALS 270 tablet 1   simvastatin (ZOCOR) 10 MG tablet Take 1 tablet (10 mg total) by mouth at bedtime. 90 tablet 3   glucose blood (ACCU-CHEK GUIDE) test strip Use to check blood sugars once a day 100 each 3   glucose blood test strip Use as instructed 100 each 12   lisinopril (ZESTRIL) 20 MG tablet Take 20 mg by mouth 2 (two) times daily.     fluticasone (FLONASE) 50 MCG/ACT nasal spray Place 2 sprays into both nostrils daily. (Patient not taking: Reported on 08/27/2022)     loratadine (CLARITIN) 10 MG  tablet Take 1 tablet (10 mg total) by mouth daily. (Patient not taking: Reported on 08/05/2023) 90 tablet 3   metoCLOPramide (REGLAN) 5 MG tablet Take 1 tablet (5 mg total) by mouth 3 (three) times daily before meals. 90 tablet 1   omeprazole (PRILOSEC) 40 MG capsule Take 1 capsule (40 mg total) by mouth daily. Before breakfast (Patient not taking: Reported on 08/05/2023) 30 capsule 11   spironolactone (ALDACTONE) 25 MG tablet Take 1 tablet (25 mg total) by mouth daily. (Patient not taking: Reported on 08/05/2023) 90 tablet 3   No facility-administered medications prior to visit.    ROS: Review of Systems  Constitutional:  Positive for unexpected weight change. Negative for activity change, appetite change, chills and fatigue.  HENT:  Negative for congestion, mouth sores and sinus pressure.   Eyes:  Negative for visual disturbance.  Respiratory:  Negative for cough and chest tightness.   Gastrointestinal:  Positive for nausea. Negative for abdominal pain.  Genitourinary:  Negative for difficulty urinating, frequency and vaginal pain.  Musculoskeletal:  Positive for gait problem. Negative for back pain.  Skin:  Negative for pallor and rash.  Neurological:  Negative for dizziness, tremors, weakness, numbness and headaches.  Hematological:  Does not bruise/bleed easily.  Psychiatric/Behavioral:  Negative for confusion, sleep disturbance and suicidal  ideas.     Objective:  BP 124/74   Pulse 79   Temp 98.6 F (37 C) (Oral)   Ht 5\' 1"  (1.549 m)   Wt 161 lb (73 kg)   SpO2 95%   BMI 30.42 kg/m   BP Readings from Last 3 Encounters:  08/05/23 124/74  12/02/22 122/80  10/22/22 122/68    Wt Readings from Last 3 Encounters:  08/05/23 161 lb (73 kg)  12/02/22 151 lb (68.5 kg)  10/22/22 158 lb (71.7 kg)    Physical Exam Constitutional:      General: She is not in acute distress.    Appearance: She is well-developed. She is obese.  HENT:     Head: Normocephalic.     Right Ear:  External ear normal.     Left Ear: External ear normal.     Nose: Nose normal.  Eyes:     General:        Right eye: No discharge.        Left eye: No discharge.     Conjunctiva/sclera: Conjunctivae normal.     Pupils: Pupils are equal, round, and reactive to light.  Neck:     Thyroid: No thyromegaly.     Vascular: No JVD.     Trachea: No tracheal deviation.  Cardiovascular:     Rate and Rhythm: Normal rate and regular rhythm.     Heart sounds: Normal heart sounds.  Pulmonary:     Effort: No respiratory distress.     Breath sounds: No stridor. No wheezing.  Abdominal:     General: Bowel sounds are normal. There is no distension.     Palpations: Abdomen is soft. There is no mass.     Tenderness: There is no abdominal tenderness. There is no guarding or rebound.  Musculoskeletal:        General: No tenderness.     Cervical back: Normal range of motion and neck supple. No rigidity.     Right lower leg: No edema.     Left lower leg: No edema.  Lymphadenopathy:     Cervical: No cervical adenopathy.  Skin:    Findings: No erythema or rash.  Neurological:     Mental Status: She is oriented to person, place, and time.     Cranial Nerves: No cranial nerve deficit.     Motor: No abnormal muscle tone.     Coordination: Coordination normal.     Gait: Gait normal.     Deep Tendon Reflexes: Reflexes normal.  Psychiatric:        Behavior: Behavior normal.        Thought Content: Thought content normal.        Judgment: Judgment normal.     Lab Results  Component Value Date   WBC 7.6 12/02/2022   HGB 14.2 12/02/2022   HCT 44.0 12/02/2022   PLT 240.0 12/02/2022   GLUCOSE 99 12/02/2022   CHOL 216 (H) 03/18/2018   TRIG 125 03/18/2018   HDL 58 03/18/2018   LDLCALC 133 (H) 03/18/2018   ALT 13 12/02/2022   AST 17 12/02/2022   NA 140 12/02/2022   K 5.4 No hemolysis seen (H) 12/02/2022   CL 104 12/02/2022   CREATININE 1.12 12/02/2022   BUN 30 (H) 12/02/2022   CO2 27 12/02/2022    TSH 0.88 12/02/2022   HGBA1C 6.4 12/02/2022    VAS US CAROTID Result Date: 08/26/2022 Carotid Arterial Duplex Study Patient Name:  Susan Keith  Date of  Exam:   08/26/2022 Medical Rec #: [id number]       Accession #:    [id number] Date of Birth: 1945-07-01      Patient Gender: F Patient Age:   32 years Exam Location:  [facility] Procedure:      VAS US CAROTID Referring Phys: Sherald Hess --------------------------------------------------------------------------------  Indications:       Carotid artery disease. Comparison Study:  CTA 08/13/22: Right <50% stenosis. Left ICA stenosis estimated                    at 75%, approaching a RADIOGRAPHIC STRING SIGN. Performing Technologist: Thereasa Parkin RVT  Examination Guidelines: A complete evaluation includes B-mode imaging, spectral Doppler, color Doppler, and power Doppler as needed of all accessible portions of each vessel. Bilateral testing is considered an integral part of a complete examination. Limited examinations for reoccurring indications may be performed as noted.  Right Carotid Findings: +----------+--------+--------+--------+------------------+--------+           PSV cm/sEDV cm/sStenosisPlaque DescriptionComments +----------+--------+--------+--------+------------------+--------+ CCA Prox  50      11                                         +----------+--------+--------+--------+------------------+--------+ CCA Mid   64      14                                         +----------+--------+--------+--------+------------------+--------+ CCA Distal64      14                                         +----------+--------+--------+--------+------------------+--------+ ICA Prox  41      9       1-39%   heterogenous               +----------+--------+--------+--------+------------------+--------+ ICA Mid   43      15                                          +----------+--------+--------+--------+------------------+--------+ ICA Distal50      14                                         +----------+--------+--------+--------+------------------+--------+ ECA       63      11                                         +----------+--------+--------+--------+------------------+--------+ +----------+--------+-------+----------------+-------------------+           PSV cm/sEDV cmsDescribe        Arm Pressure (mmHG) +----------+--------+-------+----------------+-------------------+ Subclavian61      0      Multiphasic, [id number]                 +----------+--------+-------+----------------+-------------------+ +---------+--------+--+--------+--+---------+ VertebralPSV cm/s59EDV cm/s12Antegrade +---------+--------+--+--------+--+---------+  Left Carotid Findings: +----------+--------+--------+--------+----------------------------+--------+           PSV  cm/sEDV cm/sStenosisPlaque Description          Comments +----------+--------+--------+--------+----------------------------+--------+ CCA Prox  74      12                                                   +----------+--------+--------+--------+----------------------------+--------+ CCA Mid   66      12                                                   +----------+--------+--------+--------+----------------------------+--------+ CCA Distal58      11              homogeneous and heterogenous         +----------+--------+--------+--------+----------------------------+--------+ ICA Prox  172     49      40-59%  calcific                             +----------+--------+--------+--------+----------------------------+--------+ ICA Mid   90      17                                                   +----------+--------+--------+--------+----------------------------+--------+ ICA Distal69      17                                                    +----------+--------+--------+--------+----------------------------+--------+ ECA       44      7                                                    +----------+--------+--------+--------+----------------------------+--------+ +----------+--------+--------+----------------+-------------------+           PSV cm/sEDV cm/sDescribe        Arm Pressure (mmHG) +----------+--------+--------+----------------+-------------------+ [id number]     0       Multiphasic, [id number]                 +----------+--------+--------+----------------+-------------------+ +---------+--------+--+--------+-+---------+ VertebralPSV cm/s23EDV cm/s5Antegrade +---------+--------+--+--------+-+---------+   Summary: Right Carotid: Velocities in the right ICA are consistent with a 1-39% stenosis. Left Carotid: Velocities in the left ICA are consistent with a 40-59% stenosis. Vertebrals:  Bilateral vertebral arteries demonstrate antegrade flow. Subclavians: Normal flow hemodynamics were seen in bilateral subclavian              arteries. *See table(s) above for measurements and observations.  Electronically signed by Sherald Hess MD on 08/26/2022 at 1:28:40 PM.    Final     Assessment & Plan:   Problem List Items Addressed This Visit     Carotid artery disease (HCC)   On Simvastatin - no new GI sx's w/it Pt continues to smoke - discussed      Relevant Medications   losartan (COZAAR) 50 MG  tablet   CRI (chronic renal insufficiency), stage 3 (moderate) (HCC)   Monitor GFR Hydrate well      Relevant Orders   Hemoglobin A1c   Comprehensive metabolic panel   CBC with Differential/Platelet   Diabetes mellitus (HCC)   On Prandin to 1 mg with meals.      Relevant Medications   losartan (COZAAR) 50 MG tablet   Other Relevant Orders   Hemoglobin A1c   Comprehensive metabolic panel   CBC with Differential/Platelet   TSH   T4, free   Essential hypertension - Primary   On Lisinopril, Aldactone,  Amlodipine D/c Lisinopril (?side effects)- start Losartan instead      Relevant Medications   losartan (COZAAR) 50 MG tablet   Migraines   Maxalt prn, Tylenol prn      Relevant Medications   celecoxib (CELEBREX) 200 MG capsule   gabapentin (NEURONTIN) 100 MG capsule   losartan (COZAAR) 50 MG tablet   Nausea without vomiting   Chronic Not on Rx      Prediabetes   Try Phentermine Loose wt Try apple cider vinegar      Relevant Orders   Hemoglobin A1c   Comprehensive metabolic panel   CBC with Differential/Platelet   TSH   T4, free   Tobacco use   Smoking         Meds ordered this encounter  Medications   losartan (COZAAR) 50 MG tablet    Sig: Take 1 tablet (50 mg total) by mouth daily.    Dispense:  90 tablet    Refill:  3   glucose blood (ACCU-CHEK GUIDE TEST) test strip    Sig: Use as instructed every day prn    Dispense:  50 each    Refill:  11      Follow-up: Return in about 3 months (around 11/05/2023) for a follow-up visit.  Sonda Primes, MD

## 2023-08-05 NOTE — Assessment & Plan Note (Signed)
Chronic Not on Rx

## 2023-08-08 DIAGNOSIS — M2041 Other hammer toe(s) (acquired), right foot: Secondary | ICD-10-CM | POA: Diagnosis not present

## 2023-08-08 DIAGNOSIS — M2011 Hallux valgus (acquired), right foot: Secondary | ICD-10-CM | POA: Diagnosis not present

## 2023-08-08 DIAGNOSIS — M24574 Contracture, right foot: Secondary | ICD-10-CM | POA: Diagnosis not present

## 2023-08-08 DIAGNOSIS — M7741 Metatarsalgia, right foot: Secondary | ICD-10-CM | POA: Diagnosis not present

## 2023-08-08 DIAGNOSIS — M21612 Bunion of left foot: Secondary | ICD-10-CM | POA: Diagnosis not present

## 2023-08-08 DIAGNOSIS — M21611 Bunion of right foot: Secondary | ICD-10-CM | POA: Diagnosis not present

## 2023-08-14 ENCOUNTER — Telehealth: Payer: Self-pay

## 2023-08-14 NOTE — Telephone Encounter (Signed)
 Copied from CRM [id number]. Topic: Clinical - Medical Advice >> Aug 14, 2023  1:33 PM [place] wrote:  Reason for CRM: Patient is going to have surgery at [facility]. Patient needs to get a clearance that she is able to have the surgery. Please reach out to patient daughter as soon as possible  [phone] [email or web address]

## 2023-08-18 ENCOUNTER — Ambulatory Visit (INDEPENDENT_AMBULATORY_CARE_PROVIDER_SITE_OTHER): Payer: Medicare Other | Admitting: Physician Assistant

## 2023-08-18 ENCOUNTER — Encounter: Payer: Self-pay | Admitting: Physician Assistant

## 2023-08-18 VITALS — BP 148/78 | HR 82 | Ht 59.0 in | Wt 160.0 lb

## 2023-08-18 DIAGNOSIS — R194 Change in bowel habit: Secondary | ICD-10-CM | POA: Diagnosis not present

## 2023-08-18 DIAGNOSIS — Z9889 Other specified postprocedural states: Secondary | ICD-10-CM | POA: Diagnosis not present

## 2023-08-18 DIAGNOSIS — Z860101 Personal history of adenomatous and serrated colon polyps: Secondary | ICD-10-CM

## 2023-08-18 DIAGNOSIS — R110 Nausea: Secondary | ICD-10-CM | POA: Diagnosis not present

## 2023-08-18 MED ORDER — SUFLAVE 178.7 G PO SOLR: 1.0000 | Freq: Once | ORAL | 0 refills | Status: AC

## 2023-08-18 NOTE — Progress Notes (Signed)
 Chief Complaint: History of Nissen fundoplication, nausea vomiting and bloating  HPI:    Susan Keith is at is a 78 year old [place] female with a past medical history as listed below including CAD, CKD stage III, diabetes and multiple others, known to Dr. Marina Goodell, who was referred to me by Plotnikov, Susan Quint, MD for a complaint of nausea, vomiting and bloating in the setting of prior Nissen fundoplication.      10/28/2018 colonoscopy with a history of adenomatous polyps and recommended repeat in 2025.    03/09/2021 EGD was normal, stomach normal save nonobstructing paraesophageal hernia.    06/11/2022 patient seen in clinic by Susan Gip, PA-C and at that time had been seen in January 2023 with complaints of chronic nausea, dyspepsia and fullness.  Had a known paraesophageal hernia.  Had undergone CT which showed a very large hernia containing most of the stomach.  She was referred to surgery at the time and underwent a hiatal hernia repair with toupee procedure with Dr. Derrell Lolling in March 2022.  Barium swallow postoperatively did show some narrowing at the site of surgery with intermittent passing of contrast.  Initially seemed to improve.  Stayed on Protonix 40 mg twice daily.  At that time asking to switch to Omeprazole as it seemed to work better for her.  Also Reglan 3 times daily 5 mg.  At that time complaining of diarrhea and gassiness.  At times suspected loose stools could be due to Reglan +/- IBS.  Plan at that time to stop Reglan.  Given a prescription for Zofran as needed.  Start Omeprazole 40 mg daily.    08/13/2022 CT with no acute intra-abdominal pathology, interval hiatal hernia repair, moderate coronary artery calcification, fibroid uterus and aortic atherosclerosis.    08/05/2023 CMP with a glucose of 117 and otherwise normal, CBC normal.  TSH elevated at 5.62, free T4 normal.  Hemoglobin A1c 6.7.    Today, the patient presents to clinic accompanied by her daughter and interpreter.   They seem quite aggravated that the patient continues with chronic issues that have never been resolved even after time for Nissen fundoplication.  Describes that she has had chronic nausea over this time.  Regardless of any medicine she has been on, the Reglan never helped and the Zofran never helped either, though it does not sound like they scheduled this out and she was just taking it as needed.  Along with this has constant discomfort in her epigastrium which has never been better either.  She is on Omeprazole 40 mg daily.  She does not think this is helpful.    Also complains of stools that radiate back-and-forth from diarrhea to constipation.  Apparently tried a fiber supplement at some point which helped for a little while and then stopped helping.  She does not drink enough water per day per her daughter.      Apparently treated for SIBO with "2 different medicines and a probiotic" via a telemedicine doctor from a different state and also tested positive for parasites per the patient's family but this was never treated.    Denies fever, chills or weight loss.  Past Medical History:  Diagnosis Date   Allergic rhinitis    Arthritis    "right hip" (01/07/2017)   Diabetes mellitus without complication (HCC) 2017   Fibroids    "they've decreased in size" (01/07/2017)   GERD (gastroesophageal reflux disease)    Hiatal hernia    High cholesterol    Hypertension  Left bundle branch block    Osteoporosis    Pre-diabetes    Thyroid disease    on synthroid   Vitamin D deficiency     Past Surgical History:  Procedure Laterality Date   APPENDECTOMY     COLONOSCOPY  2017   with endoscopy   FRACTURE SURGERY     INSERTION OF MESH N/A 08/14/2021   Procedure: INSERTION OF MESH;  Surgeon: Axel Filler, MD;  Location: [facility] OR;  Service: General;  Laterality: N/A;   UPPER GASTROINTESTINAL ENDOSCOPY  2019   WRIST FRACTURE SURGERY Right 05/2015   XI ROBOTIC ASSISTED HIATAL HERNIA REPAIR N/A  08/14/2021   Procedure: XI ROBOTIC ASSISTED HIATAL HERNIA REPAIR WITH FUNDOPLICATION;  Surgeon: Axel Filler, MD;  Location: [facility] OR;  Service: General;  Laterality: N/A;    Current Outpatient Medications  Medication Sig Dispense Refill   amLODipine (NORVASC) 5 MG tablet TAKE 1 TABLET(5 MG) BY MOUTH DAILY 90 tablet 3   carvedilol (COREG) 6.25 MG tablet Take 1 tablet (6.25 mg total) by mouth 2 (two) times daily with a meal. 60 tablet 11   fluticasone (FLONASE) 50 MCG/ACT nasal spray Place 2 sprays into both nostrils daily. (Patient not taking: Reported on 08/27/2022)     glucose blood (ACCU-CHEK GUIDE TEST) test strip Use as instructed every day prn 50 each 11   levothyroxine (SYNTHROID) 88 MCG tablet TAKE 1 TABLET BY MOUTH DAILY BEFORE BREAKFAST. 90 tablet 3   loratadine (CLARITIN) 10 MG tablet Take 1 tablet (10 mg total) by mouth daily. (Patient not taking: Reported on 08/05/2023) 90 tablet 3   losartan (COZAAR) 50 MG tablet Take 1 tablet (50 mg total) by mouth daily. 90 tablet 3   metoCLOPramide (REGLAN) 5 MG tablet Take 1 tablet (5 mg total) by mouth 3 (three) times daily before meals. 90 tablet 1   metroNIDAZOLE (FLAGYL) 500 MG tablet Take 1 tablet (500 mg total) by mouth 3 (three) times daily. 30 tablet 0   omeprazole (PRILOSEC) 40 MG capsule Take 1 capsule (40 mg total) by mouth daily. Before breakfast (Patient not taking: Reported on 08/05/2023) 30 capsule 11   ondansetron (ZOFRAN) 4 MG tablet Take 1 tablet (4 mg total) by mouth every 6 (six) hours. 40 tablet 3   pantoprazole (PROTONIX) 40 MG tablet TAKE 1 TABLET(40 MG) BY MOUTH TWICE DAILY 60 tablet 3   Probiotic Product (ALIGN) 4 MG CAPS Take 1 capsule (4 mg total) by mouth daily. 30 capsule 1   repaglinide (PRANDIN) 0.5 MG tablet TAKE 1 TABLET(0.5 MG) BY MOUTH THREE TIMES DAILY BEFORE MEALS 270 tablet 1   simvastatin (ZOCOR) 10 MG tablet Take 1 tablet (10 mg total) by mouth at bedtime. 90 tablet 3   spironolactone (ALDACTONE) 25 MG tablet  Take 1 tablet (25 mg total) by mouth daily. (Patient not taking: Reported on 08/05/2023) 90 tablet 3   No current facility-administered medications for this visit.    Allergies as of 08/18/2023   (No Known Allergies)    Family History  Problem Relation Age of Onset   Diabetes Mother    Hypertension Mother    Diabetes Father    Colon cancer Neg Hx    Colon polyps Neg Hx    Rectal cancer Neg Hx    Stomach cancer Neg Hx    Esophageal cancer Neg Hx     Social History   Socioeconomic History   Marital status: Married    Spouse name: Not on file   Number  of children: 2   Years of education: Not on file   Highest education level: Not on file  Occupational History   Not on file  Tobacco Use   Smoking status: Every Day    Current packs/day: 0.25    Average packs/day: 0.3 packs/day for 48.0 years (12.0 ttl pk-yrs)    Types: Cigarettes   Smokeless tobacco: Never  Vaping Use   Vaping status: Never Used  Substance and Sexual Activity   Alcohol use: No   Drug use: Never   Sexual activity: Not on file  Other Topics Concern   Not on file  Social History Narrative   Right Handed    Lives in a one story with her husband   Social Drivers of Corporate investment banker Strain: Not on file  Food Insecurity: Not on file  Transportation Needs: Not on file  Physical Activity: Not on file  Stress: Not on file  Social Connections: Not on file  Intimate Partner Violence: Not on file    Review of Systems:    Constitutional: No weight loss, fever or chills Cardiovascular: No chest pain Respiratory: No SOB  Gastrointestinal: See HPI and otherwise negative Genitourinary: No dysuria Neurological: No headache, dizziness or syncope Musculoskeletal: No new muscle or joint pain Hematologic: No bleeding Psychiatric: No history of depression or anxiety   Physical Exam:  Vital signs: BP (!) 148/78   Pulse 82   Ht 4\' 11"  (1.499 m)   Wt 160 lb (72.6 kg)   BMI 32.32 kg/m     Constitutional:   Pleasant Elderly [place] female appears to be in NAD, Well developed, Well nourished, alert and cooperative Head:  Normocephalic and atraumatic. Eyes:   PEERL, EOMI. No icterus. Conjunctiva pink. Ears:  Normal auditory acuity. Neck:  Supple Throat: Oral cavity and pharynx without inflammation, swelling or lesion.  Respiratory: Respirations even and unlabored. Lungs clear to auscultation bilaterally.   No wheezes, crackles, or rhonchi.  Cardiovascular: Normal S1, S2. No MRG. Regular rate and rhythm. No peripheral edema, cyanosis or pallor.  Gastrointestinal:  Soft, nondistended, nontender. No rebound or guarding. Normal bowel sounds. No appreciable masses or hepatomegaly. Rectal:  Not performed.  Msk:  Symmetrical without gross deformities. Without edema, no deformity or joint abnormality.  Neurologic:  Alert and  oriented x4;  grossly normal neurologically.  Skin:   Dry and intact without significant lesions or rashes. Psychiatric: Demonstrates good judgement and reason without abnormal affect or behaviors.  RELEVANT LABS AND IMAGING: CBC    Component Value Date/Time   WBC 8.0 08/05/2023 1219   RBC 4.83 08/05/2023 1219   HGB 14.5 08/05/2023 1219   HGB 13.4 03/18/2018 0926   HCT 44.5 08/05/2023 1219   HCT 40.5 03/18/2018 0926   PLT 261.0 08/05/2023 1219   PLT 304 03/18/2018 0926   MCV 92.3 08/05/2023 1219   MCV 85 03/18/2018 0926   MCH 28.5 08/13/2022 0051   MCHC 32.4 08/05/2023 1219   RDW 14.9 08/05/2023 1219   RDW 13.3 03/18/2018 0926   LYMPHSABS 2.2 08/05/2023 1219   MONOABS 0.5 08/05/2023 1219   EOSABS 0.2 08/05/2023 1219   BASOSABS 0.0 08/05/2023 1219    CMP     Component Value Date/Time   NA 140 08/05/2023 1219   NA 146 (H) 03/18/2018 0926   K 4.8 08/05/2023 1219   CL 103 08/05/2023 1219   CO2 29 08/05/2023 1219   GLUCOSE 117 (H) 08/05/2023 1219   BUN 21 08/05/2023 1219  BUN 24 03/18/2018 0926   CREATININE 0.79 08/05/2023 1219   CALCIUM  9.6 08/05/2023 1219   PROT 7.7 08/05/2023 1219   ALBUMIN 4.6 08/05/2023 1219   AST 26 08/05/2023 1219   ALT 25 08/05/2023 1219   ALKPHOS 104 08/05/2023 1219   BILITOT 0.5 08/05/2023 1219   GFRNONAA >60 08/13/2022 0051   GFRAA 64 03/18/2018 0926    Assessment: 1.  Change in bowel habits: Radiates back-and-forth from diarrhea to constipation; consider patient to diet +/- IBS 2.  Chronic nausea: For years, apparently unchanged even after Nissen fundoplication for paraesophageal hernia, no help from Zofran or Reglan in the past per patient; consider H. pylori versus PUD versus functional dyspepsia versus altered Nissen fundoplication 3.  History of adenomatous polyps: Last colonoscopy in June 2020, repeat due in June 4.  History of Nissen fundoplication: In 2022  Plan: 1.  Scheduled patient for diagnostic EGD and colonoscopy in the [facility] with Dr. Marina Goodell.  Did provide the patient a detailed list of risks for the procedures and she agrees to proceed. Patient is appropriate for endoscopic procedure(s) in the ambulatory ([facility]) setting.  2.  Suggested that we try to schedule out Zofran or even retry Reglan but the patient does not want these medicines anymore because they never did anything. 3.  Patient does not feel like increasing Omeprazole to twice daily will do anything either, so kept her on once daily dosing. 4.  Discussed adding back her fiber supplement as this typically helps with radiation of stools but she is unconvinced.  Recommend she increase water intake to at least eight 8 ounce glasses per day. 5.  Family will try to get stool study results from me so I can see about this positive parasite testing.  Apparently she was treated for SIBO as well as sounds like with maybe Cipro and Flagyl but also a probiotic at the same time.  May need to retest for this in the future. 6.  Patient to follow in clinic with [place] per recommendations after time of procedure.  Susan Meeker, PA-C [facility] 08/18/2023, 9:09 AM  Cc: Tresa Garter, MD

## 2023-08-18 NOTE — Progress Notes (Signed)
 Noted.

## 2023-08-18 NOTE — Patient Instructions (Signed)
 Increase water intake to 8  8oz glasses a day   Follow up per recommendations after procedures  You have been scheduled for an endoscopy and colonoscopy. Please follow the written instructions given to you at your visit today.  If you use inhalers (even only as needed), please bring them with you on the day of your procedure.  DO NOT TAKE 7 DAYS PRIOR TO TEST- Trulicity (dulaglutide) Ozempic, Wegovy (semaglutide) Mounjaro (tirzepatide) Bydureon Bcise (exanatide extended release)  DO NOT TAKE 1 DAY PRIOR TO YOUR TEST Rybelsus (semaglutide) Adlyxin (lixisenatide) Victoza (liraglutide) Byetta (exanatide) ___________________________________________________________________________  Bonita Quin will receive your bowel preparation through [facility], which ensures the lowest copay and home delivery, with outreach via text or call from an [phone] number. Please respond promptly to avoid rescheduling of your procedure. If you are interested in alternative options or have any questions regarding your prep, please contact them at [phone] ____________________________________________________________________________  Your Provider Has Sent Your Bowel Prep Regimen To [facility]   [facility] will contact you to verify your information and collect your copay, if applicable. Enjoy the comfort of your home while your prescription is mailed to you, FREE of any shipping charges.   [facility] accepts all major insurance benefits and applies discounts & coupons.  Have additional questions?   Chat: [email or web address] Call: [phone] Email: [email or web address] NCPDP: [id number]  How will [facility] contact you?  With a Welcome phone call,  a Welcome text and a checkout link in text form.  Texts you receive from [id number] Are NOT Spam.  *To set up delivery, you must complete the checkout process via link or speak to one of the patient care representatives. If [facility] is unable to reach  you, your prescription may be delayed.  To avoid long hold times on the phone, you may also utilize the secure chat feature on the [facility] website to request that they call you back for transaction completion or to expedite your concerns.   Due to recent changes in healthcare laws, you may see the results of your imaging and laboratory studies on MyChart before your provider has had a chance to review them.  We understand that in some cases there may be results that are confusing or concerning to you. Not all laboratory results come back in the same time frame and the provider may be waiting for multiple results in order to interpret others.  Please give [place] 48 hours in order for your provider to thoroughly review all the results before contacting the office for clarification of your results.    I appreciate the  opportunity to care for you  Thank You   Jacelyn Grip

## 2023-08-22 NOTE — Telephone Encounter (Signed)
 Called and spoke with patient contact and they noted that this issue had since been resolved.

## 2023-08-23 ENCOUNTER — Encounter: Payer: Self-pay | Admitting: Internal Medicine

## 2023-09-10 ENCOUNTER — Encounter: Admitting: Internal Medicine

## 2023-09-15 ENCOUNTER — Encounter: Payer: Self-pay | Admitting: Podiatry

## 2023-09-15 ENCOUNTER — Ambulatory Visit (INDEPENDENT_AMBULATORY_CARE_PROVIDER_SITE_OTHER): Admitting: Podiatry

## 2023-09-15 DIAGNOSIS — Z79899 Other long term (current) drug therapy: Secondary | ICD-10-CM | POA: Diagnosis not present

## 2023-09-15 DIAGNOSIS — M79675 Pain in left toe(s): Secondary | ICD-10-CM

## 2023-09-15 DIAGNOSIS — M79674 Pain in right toe(s): Secondary | ICD-10-CM | POA: Diagnosis not present

## 2023-09-15 DIAGNOSIS — B351 Tinea unguium: Secondary | ICD-10-CM | POA: Diagnosis not present

## 2023-09-15 MED ORDER — TERBINAFINE HCL 250 MG PO TABS: 250.0000 mg | ORAL_TABLET | Freq: Every day | ORAL | 0 refills | Status: AC

## 2023-09-15 NOTE — Patient Instructions (Signed)

## 2023-09-15 NOTE — Progress Notes (Signed)
 Subjective:   Patient ID: Susan Keith, female   DOB: 78 y.o.   MRN: [id number]   HPI Chief Complaint  Patient presents with   Nail Problem    RM#12 NP a diabetic having hard and brittle nails unable to cut, wants an evaluation   78 year old female presents the office for debridement of dorsal to transitive for concerns of thick, elongated nails that she is not able to herself because discomfort and concerned about fungus.  Denies any swelling or redness.  No recent treatment.  No open lesions or any claudication symptoms.  Last A1c was 6.7 on August 05, 2023  Review of Systems  All other systems reviewed and are negative.  Past Medical History:  Diagnosis Date   Allergic rhinitis    Arthritis    "right hip" (01/07/2017)   Diabetes mellitus without complication (HCC) 2017   Fibroids    "they've decreased in size" (01/07/2017)   GERD (gastroesophageal reflux disease)    Hiatal hernia    High cholesterol    Hypertension    Left bundle branch block    Osteoporosis    Pre-diabetes    Thyroid  disease    on synthroid    Vitamin D deficiency     Past Surgical History:  Procedure Laterality Date   APPENDECTOMY     COLONOSCOPY  2017   with endoscopy   FRACTURE SURGERY     INSERTION OF MESH N/A 08/14/2021   Procedure: INSERTION OF MESH;  Surgeon: Shela Derby, MD;  Location: [facility] OR;  Service: General;  Laterality: N/A;   UPPER GASTROINTESTINAL ENDOSCOPY  2019   WRIST FRACTURE SURGERY Right 05/2015   XI ROBOTIC ASSISTED HIATAL HERNIA REPAIR N/A 08/14/2021   Procedure: XI ROBOTIC ASSISTED HIATAL HERNIA REPAIR WITH FUNDOPLICATION;  Surgeon: Shela Derby, MD;  Location: [facility] OR;  Service: General;  Laterality: N/A;     Current Outpatient Medications:    amLODipine  (NORVASC ) 5 MG tablet, TAKE 1 TABLET(5 MG) BY MOUTH DAILY, Disp: 90 tablet, Rfl: 3   carvedilol  (COREG ) 6.25 MG tablet, Take 1 tablet (6.25 mg total) by mouth 2 (two) times daily with a meal., Disp: 60 tablet, Rfl:  11   fluticasone (FLONASE) 50 MCG/ACT nasal spray, Place 2 sprays into both nostrils daily., Disp: , Rfl:    glucose blood (ACCU-CHEK GUIDE TEST) test strip, Use as instructed every day prn, Disp: 50 each, Rfl: 11   levothyroxine  (SYNTHROID ) 88 MCG tablet, TAKE 1 TABLET BY MOUTH DAILY BEFORE BREAKFAST., Disp: 90 tablet, Rfl: 3   loratadine  (CLARITIN ) 10 MG tablet, Take 1 tablet (10 mg total) by mouth daily., Disp: 90 tablet, Rfl: 3   omeprazole  (PRILOSEC) 40 MG capsule, Take 1 capsule (40 mg total) by mouth daily. Before breakfast, Disp: 30 capsule, Rfl: 11   ondansetron  (ZOFRAN ) 4 MG tablet, Take 1 tablet (4 mg total) by mouth every 6 (six) hours., Disp: 40 tablet, Rfl: 3   repaglinide  (PRANDIN ) 0.5 MG tablet, TAKE 1 TABLET(0.5 MG) BY MOUTH THREE TIMES DAILY BEFORE MEALS, Disp: 270 tablet, Rfl: 1   spironolactone  (ALDACTONE ) 25 MG tablet, Take 1 tablet (25 mg total) by mouth daily., Disp: 90 tablet, Rfl: 3   terbinafine  (LAMISIL ) 250 MG tablet, Take 1 tablet (250 mg total) by mouth daily., Disp: 90 tablet, Rfl: 0  No Known Allergies        Objective:  Physical Exam  General: AAO x3, NAD  Dermatological: Nails are hypertrophic, dystrophic, brittle, discolored, elongated 10. No surrounding redness or drainage.  Tenderness nails 1-5 bilaterally. No open lesions or pre-ulcerative lesions are identified today.  Vascular: Dorsalis Pedis artery and Posterior Tibial artery pedal pulses are 2/4 bilateral with immedate capillary fill time. There is no pain with calf compression, swelling, warmth, erythema.   Neruologic: Grossly intact via light touch bilateral.   Musculoskeletal: Second nails no other areas of discomfort.  Digital deformities are noted.      Assessment:   Symptomatic onychomycosis     Plan:  -Treatment options discussed including all alternatives, risks, and complications -Etiology of symptoms were discussed - Sharply debrided nails x 10 without any complications or  bleeding -We discussed treatment options for nail fungus including oral, topical as well as alternative treatments.  After discussion she would they want to proceed with oral Lamisil .  Discussed side effects, success rates.  Will check a CBC, LFT prior to starting the medication.  Charity Conch DPM

## 2023-09-16 ENCOUNTER — Ambulatory Visit: Admitting: Internal Medicine

## 2023-09-16 ENCOUNTER — Encounter: Payer: Self-pay | Admitting: Internal Medicine

## 2023-09-16 VITALS — BP 152/75 | HR 69 | Temp 97.8°F | Resp 16 | Ht 59.0 in | Wt 160.0 lb

## 2023-09-16 DIAGNOSIS — Z860101 Personal history of adenomatous and serrated colon polyps: Secondary | ICD-10-CM | POA: Diagnosis not present

## 2023-09-16 DIAGNOSIS — Z1211 Encounter for screening for malignant neoplasm of colon: Secondary | ICD-10-CM

## 2023-09-16 DIAGNOSIS — R7303 Prediabetes: Secondary | ICD-10-CM | POA: Diagnosis not present

## 2023-09-16 DIAGNOSIS — K2950 Unspecified chronic gastritis without bleeding: Secondary | ICD-10-CM | POA: Diagnosis not present

## 2023-09-16 DIAGNOSIS — R110 Nausea: Secondary | ICD-10-CM

## 2023-09-16 DIAGNOSIS — E7800 Pure hypercholesterolemia, unspecified: Secondary | ICD-10-CM | POA: Diagnosis not present

## 2023-09-16 DIAGNOSIS — D122 Benign neoplasm of ascending colon: Secondary | ICD-10-CM | POA: Diagnosis not present

## 2023-09-16 DIAGNOSIS — K219 Gastro-esophageal reflux disease without esophagitis: Secondary | ICD-10-CM

## 2023-09-16 DIAGNOSIS — Q399 Congenital malformation of esophagus, unspecified: Secondary | ICD-10-CM | POA: Diagnosis not present

## 2023-09-16 DIAGNOSIS — K31A19 Gastric intestinal metaplasia without dysplasia, unspecified site: Secondary | ICD-10-CM | POA: Diagnosis not present

## 2023-09-16 DIAGNOSIS — K648 Other hemorrhoids: Secondary | ICD-10-CM | POA: Diagnosis not present

## 2023-09-16 DIAGNOSIS — K591 Functional diarrhea: Secondary | ICD-10-CM | POA: Diagnosis not present

## 2023-09-16 DIAGNOSIS — I10 Essential (primary) hypertension: Secondary | ICD-10-CM | POA: Diagnosis not present

## 2023-09-16 DIAGNOSIS — R194 Change in bowel habit: Secondary | ICD-10-CM | POA: Diagnosis not present

## 2023-09-16 MED ORDER — OMEPRAZOLE 40 MG PO CPDR: 40.0000 mg | DELAYED_RELEASE_CAPSULE | Freq: Every day | ORAL | 3 refills | Status: AC

## 2023-09-16 MED ORDER — SODIUM CHLORIDE 0.9 % IV SOLN: 500.0000 mL | Freq: Once | INTRAVENOUS | Status: DC

## 2023-09-16 NOTE — Progress Notes (Signed)
 Called to room to assist during endoscopic procedure.  Patient ID and intended procedure confirmed with present staff. Received instructions for my participation in the procedure from the performing physician.

## 2023-09-16 NOTE — Progress Notes (Signed)
 Pt's states no medical or surgical changes since previsit or office visit. Interpreter services used. Interpreter name: Denyse Flasher.

## 2023-09-16 NOTE — Patient Instructions (Signed)
 Handouts provided about hemorrhoids and polyps.  Resume previous diet.  Continue present medications.  Make sure you are taking omeprozole 40 mg daily.  Recommend CITRUCEL Powder.  Take 2 Tablespoons daily in 14 ounces of water or juice.  This will help regulate your bowels  Await pathology results.    YOU HAD AN ENDOSCOPIC PROCEDURE TODAY AT THE [facility]:   Refer to the procedure report that was given to you for any specific questions about what was found during the examination.  If the procedure report does not answer your questions, please call your gastroenterologist to clarify.  If you requested that your care partner not be given the details of your procedure findings, then the procedure report has been included in a sealed envelope for you to review at your convenience later.  YOU SHOULD EXPECT: Some feelings of bloating in the abdomen. Passage of more gas than usual.  Walking can help get rid of the air that was put into your GI tract during the procedure and reduce the bloating. If you had a lower endoscopy (such as a colonoscopy or flexible sigmoidoscopy) you may notice spotting of blood in your stool or on the toilet paper. If you underwent a bowel prep for your procedure, you may not have a normal bowel movement for a few days.  Please Note:  You might notice some irritation and congestion in your nose or some drainage.  This is from the oxygen used during your procedure.  There is no need for concern and it should clear up in a day or so.  SYMPTOMS TO REPORT IMMEDIATELY:  Following lower endoscopy (colonoscopy or flexible sigmoidoscopy):  Excessive amounts of blood in the stool  Significant tenderness or worsening of abdominal pains  Swelling of the abdomen that is new, acute  Fever of 100F or higher  Following upper endoscopy (EGD)  Vomiting of blood or coffee ground material  New chest pain or pain under the shoulder blades  Painful or persistently  difficult swallowing  New shortness of breath  Fever of 100F or higher  Black, tarry-looking stools  For urgent or emergent issues, a gastroenterologist can be reached at any hour by calling [phone]. Do not use MyChart messaging for urgent concerns.    DIET:  We do recommend a small meal at first, but then you may proceed to your regular diet.  Drink plenty of fluids but you should avoid alcoholic beverages for 24 hours.  ACTIVITY:  You should plan to take it easy for the rest of today and you should NOT DRIVE or use heavy machinery until tomorrow (because of the sedation medicines used during the test).    FOLLOW UP: Our staff will call the number listed on your records the next business day following your procedure.  We will call around 7:15- 8:00 am to check on you and address any questions or concerns that you may have regarding the information given to you following your procedure. If we do not reach you, we will leave a message.     If any biopsies were taken you will be contacted by phone or by letter within the next 1-3 weeks.  Please call us  at [phone] if you have not heard about the biopsies in 3 weeks.    SIGNATURES/CONFIDENTIALITY: You and/or your care partner have signed paperwork which will be entered into your electronic medical record.  These signatures attest to the fact that that the information above on your After  Visit Summary has been reviewed and is understood.  Full responsibility of the confidentiality of this discharge information lies with you and/or your care-partner.

## 2023-09-16 NOTE — Op Note (Signed)
 [facility] Patient Name: Susan Keith Procedure Date: 09/16/2023 3:54 PM MRN: [id number] Endoscopist: [name]. [name] , MD, [id number] Age: 78 Referring MD:  Date of Birth: 07/27/1945 Gender: Female Account #: [id number] Procedure:                Upper GI endoscopy with biopsies Indications:              Nausea, GERD, status post Nissen fundoplication for                            paraesophageal hernia Medicines:                Monitored Anesthesia Care Procedure:                Pre-Anesthesia Assessment:                           - Prior to the procedure, a History and Physical                            was performed, and patient medications and                            allergies were reviewed. The patient's tolerance of                            previous anesthesia was also reviewed. The risks                            and benefits of the procedure and the sedation                            options and risks were discussed with the patient.                            All questions were answered, and informed consent                            was obtained. Prior Anticoagulants: The patient has                            taken no anticoagulant or antiplatelet agents. ASA                            Grade Assessment: II - A patient with mild systemic                            disease. After reviewing the risks and benefits,                            the patient was deemed in satisfactory condition to                            undergo the procedure.  After obtaining informed consent, the endoscope was                            passed under direct vision. Throughout the                            procedure, the patient's blood pressure, pulse, and                            oxygen saturations were monitored continuously. The                            GIF [id number] #[id number] was introduced through the                            mouth, and  advanced to the second part of duodenum.                            The upper GI endoscopy was accomplished without                            difficulty. The patient tolerated the procedure                            well. Scope In: Scope Out: Findings:                 The esophagus was lightly tortuous but otherwise                            normal.                           The stomach revealed evidence of prior                            fundoplication. 1 or 2 tiny antral erosions.                            Biopsies of the antrum were taken to rule out H.                            pylori.                           The examined duodenum was normal.                           The cardia and gastric fundus were normal on                            retroflexion. Complications:            No immediate complications. Estimated Blood Loss:     Estimated blood loss: none. Impression:               1. Status post fundoplication  2. Few tiny antral erosions status post biopsies                           3. Otherwise unremarkable EGD                           4. Chronic nausea Recommendation:           1. Patient has a contact number available for                            emergencies. The signs and symptoms of potential                            delayed complications were discussed with the                            patient. Return to normal activities tomorrow.                            Written discharge instructions were provided to the                            patient.                           2. Resume previous diet.                           3 continue present medications. Make sure that you                            are taking omeprazole  40 mg daily                           4. Await pathology results.                           5. Colonoscopy today. Please see report [name]. [name], MD 09/16/2023 4:06:22 PM This report has been signed  electronically.

## 2023-09-16 NOTE — Op Note (Signed)
  [facility] Patient Name: Susan Keith Procedure Date: 09/16/2023 3:51 PM MRN: [id number] Endoscopist: [name]. [name] , MD, [id number] Age: 78 Referring MD:  Date of Birth: 1945-08-17 Gender: Female Account #: [id number] Procedure:                Colonoscopy with cold snare polypectomy x 1; with                            biopsies Indications:              High risk colon cancer surveillance: Personal                            history of multiple (3 or more) adenomas. 2017 in                            [place]; 2020 here. Also complaining of                            alternating bowel habits Medicines:                Monitored Anesthesia Care Procedure:                Pre-Anesthesia Assessment:                           - Prior to the procedure, a History and Physical                            was performed, and patient medications and                            allergies were reviewed. The patient's tolerance of                            previous anesthesia was also reviewed. The risks                            and benefits of the procedure and the sedation                            options and risks were discussed with the patient.                            All questions were answered, and informed consent                            was obtained. Prior Anticoagulants: The patient has                            taken no anticoagulant or antiplatelet agents. ASA                            Grade Assessment: II - A patient with mild systemic  disease. After reviewing the risks and benefits,                            the patient was deemed in satisfactory condition to                            undergo the procedure.                           After obtaining informed consent, the colonoscope                            was passed under direct vision. Throughout the                            procedure, the patient's blood pressure, pulse,  and                            oxygen saturations were monitored continuously. The                            CF [id number] #[id number] was introduced through the anus                            and advanced to the the cecum, identified by                            appendiceal orifice and ileocecal valve. The                            ileocecal valve, appendiceal orifice, and rectum                            were photographed. The quality of the bowel                            preparation was excellent. The colonoscopy was                            performed without difficulty. The patient tolerated                            the procedure well. The bowel preparation used was                            SUPREP via split dose instruction. Scope In: 4:08:52 PM Scope Out: 4:19:24 PM Scope Withdrawal Time: 0 hours 7 minutes 50 seconds  Total Procedure Duration: 0 hours 10 minutes 32 seconds  Findings:                 A 3 mm polyp was found in the ascending colon. The                            polyp was removed with a cold snare. Resection  and                            retrieval were complete.                           Internal hemorrhoids were found during retroflexion.                           The exam was otherwise without abnormality on                            direct and retroflexion views.                           Biopsies for histology were taken with a cold                            forceps from the entire colon for evaluation of                            microscopic colitis. Complications:            No immediate complications. Estimated blood loss:                            None. Estimated Blood Loss:     Estimated blood loss: none. Impression:               - One 3 mm polyp in the ascending colon, removed                            with a cold snare. Resected and retrieved.                           - Internal hemorrhoids. Small                           - The examination  was otherwise normal on direct                            and retroflexion views.                           - Biopsies were taken with a cold forceps from the                            entire colon for evaluation of microscopic colitis. Recommendation:           - Repeat colonoscopy is not recommended for                            surveillance.                           - Patient has a contact number available for  emergencies. The signs and symptoms of potential                            delayed complications were discussed with the                            patient. Return to normal activities tomorrow.                            Written discharge instructions were provided to the                            patient.                           - Resume previous diet.                           - Continue present medications.                           - Await pathology results.                           - RECOMMEND CITRUCEL POWDER. Take 2 tablespoons                            daily in 14 ounces of water or juice. This will                            help regulate your bowels [name]. [name], MD 09/16/2023 4:26:16 PM This report has been signed electronically.

## 2023-09-16 NOTE — Progress Notes (Signed)
 A/O x 3, gd SR's, VSS, report to RN

## 2023-09-16 NOTE — Progress Notes (Signed)
 Expand All Collapse All    Chief Complaint: History of Nissen fundoplication, nausea vomiting and bloating   HPI:    Susan Keith is at is a 78 year old [place] female with a past medical history as listed below including CAD, CKD stage III, diabetes and multiple others, known to Dr. [name], who was referred to me by Plotnikov, Oakley Bellman, MD for a complaint of nausea, vomiting and bloating in the setting of prior Nissen fundoplication.      10/28/2018 colonoscopy with a history of adenomatous polyps and recommended repeat in 2025.    03/09/2021 EGD was normal, stomach normal save nonobstructing paraesophageal hernia.    06/11/2022 patient seen in clinic by Alfonza Iles, PA-C and at that time had been seen in January 2023 with complaints of chronic nausea, dyspepsia and fullness.  Had a known paraesophageal hernia.  Had undergone CT which showed a very large hernia containing most of the stomach.  She was referred to surgery at the time and underwent a hiatal hernia repair with toupee procedure with Dr. Melton Squires in March 2022.  Barium swallow postoperatively did show some narrowing at the site of surgery with intermittent passing of contrast.  Initially seemed to improve.  Stayed on Protonix  40 mg twice daily.  At that time asking to switch to Omeprazole  as it seemed to work better for her.  Also Reglan  3 times daily 5 mg.  At that time complaining of diarrhea and gassiness.  At times suspected loose stools could be due to Reglan  +/- IBS.  Plan at that time to stop Reglan .  Given a prescription for Zofran  as needed.  Start Omeprazole  40 mg daily.    08/13/2022 CT with no acute intra-abdominal pathology, interval hiatal hernia repair, moderate coronary artery calcification, fibroid uterus and aortic atherosclerosis.    08/05/2023 CMP with a glucose of 117 and otherwise normal, CBC normal.  TSH elevated at 5.62, free T4 normal.  Hemoglobin A1c 6.7.    Today, the patient presents to clinic accompanied by her  daughter and interpreter.  They seem quite aggravated that the patient continues with chronic issues that have never been resolved even after time for Nissen fundoplication.  Describes that she has had chronic nausea over this time.  Regardless of any medicine she has been on, the Reglan  never helped and the Zofran  never helped either, though it does not sound like they scheduled this out and she was just taking it as needed.  Along with this has constant discomfort in her epigastrium which has never been better either.  She is on Omeprazole  40 mg daily.  She does not think this is helpful.    Also complains of stools that radiate back-and-forth from diarrhea to constipation.  Apparently tried a fiber supplement at some point which helped for a little while and then stopped helping.  She does not drink enough water per day per her daughter.      Apparently treated for SIBO with "2 different medicines and a probiotic" via a telemedicine doctor from a different state and also tested positive for parasites per the patient's family but this was never treated.    Denies fever, chills or weight loss.       Past Medical History:  Diagnosis Date   Allergic rhinitis     Arthritis      "right hip" (01/07/2017)   Diabetes mellitus without complication (HCC) 2017   Fibroids      "they've decreased in size" (01/07/2017)   GERD (gastroesophageal  reflux disease)     Hiatal hernia     High cholesterol     Hypertension     Left bundle branch block     Osteoporosis     Pre-diabetes     Thyroid  disease      on synthroid    Vitamin D deficiency                 Past Surgical History:  Procedure Laterality Date   APPENDECTOMY       COLONOSCOPY   2017    with endoscopy   FRACTURE SURGERY       INSERTION OF MESH N/A 08/14/2021    Procedure: INSERTION OF MESH;  Surgeon: Shela Derby, MD;  Location: [facility] OR;  Service: General;  Laterality: N/A;   UPPER GASTROINTESTINAL ENDOSCOPY   2019   WRIST FRACTURE  SURGERY Right 05/2015   XI ROBOTIC ASSISTED HIATAL HERNIA REPAIR N/A 08/14/2021    Procedure: XI ROBOTIC ASSISTED HIATAL HERNIA REPAIR WITH FUNDOPLICATION;  Surgeon: Shela Derby, MD;  Location: [facility] OR;  Service: General;  Laterality: N/A;                Current Outpatient Medications  Medication Sig Dispense Refill   amLODipine  (NORVASC ) 5 MG tablet TAKE 1 TABLET(5 MG) BY MOUTH DAILY 90 tablet 3   carvedilol  (COREG ) 6.25 MG tablet Take 1 tablet (6.25 mg total) by mouth 2 (two) times daily with a meal. 60 tablet 11   fluticasone (FLONASE) 50 MCG/ACT nasal spray Place 2 sprays into both nostrils daily. (Patient not taking: Reported on 08/27/2022)       glucose blood (ACCU-CHEK GUIDE TEST) test strip Use as instructed every day prn 50 each 11   levothyroxine  (SYNTHROID ) 88 MCG tablet TAKE 1 TABLET BY MOUTH DAILY BEFORE BREAKFAST. 90 tablet 3   loratadine  (CLARITIN ) 10 MG tablet Take 1 tablet (10 mg total) by mouth daily. (Patient not taking: Reported on 08/05/2023) 90 tablet 3   losartan  (COZAAR ) 50 MG tablet Take 1 tablet (50 mg total) by mouth daily. 90 tablet 3   metoCLOPramide  (REGLAN ) 5 MG tablet Take 1 tablet (5 mg total) by mouth 3 (three) times daily before meals. 90 tablet 1   metroNIDAZOLE  (FLAGYL ) 500 MG tablet Take 1 tablet (500 mg total) by mouth 3 (three) times daily. 30 tablet 0   omeprazole  (PRILOSEC) 40 MG capsule Take 1 capsule (40 mg total) by mouth daily. Before breakfast (Patient not taking: Reported on 08/05/2023) 30 capsule 11   ondansetron  (ZOFRAN ) 4 MG tablet Take 1 tablet (4 mg total) by mouth every 6 (six) hours. 40 tablet 3   pantoprazole  (PROTONIX ) 40 MG tablet TAKE 1 TABLET(40 MG) BY MOUTH TWICE DAILY 60 tablet 3   Probiotic Product (ALIGN) 4 MG CAPS Take 1 capsule (4 mg total) by mouth daily. 30 capsule 1   repaglinide  (PRANDIN ) 0.5 MG tablet TAKE 1 TABLET(0.5 MG) BY MOUTH THREE TIMES DAILY BEFORE MEALS 270 tablet 1   simvastatin  (ZOCOR ) 10 MG tablet Take 1 tablet  (10 mg total) by mouth at bedtime. 90 tablet 3   spironolactone  (ALDACTONE ) 25 MG tablet Take 1 tablet (25 mg total) by mouth daily. (Patient not taking: Reported on 08/05/2023) 90 tablet 3      No current facility-administered medications for this visit.           Allergies as of 08/18/2023   (No Known Allergies)           Family History  Problem Relation Age of Onset   Diabetes Mother     Hypertension Mother     Diabetes Father     Colon cancer Neg Hx     Colon polyps Neg Hx     Rectal cancer Neg Hx     Stomach cancer Neg Hx     Esophageal cancer Neg Hx            Social History         Socioeconomic History   Marital status: Married      Spouse name: Not on file   Number of children: 2   Years of education: Not on file   Highest education level: Not on file  Occupational History   Not on file  Tobacco Use   Smoking status: Every Day      Current packs/day: 0.25      Average packs/day: 0.3 packs/day for 48.0 years (12.0 ttl pk-yrs)      Types: Cigarettes   Smokeless tobacco: Never  Vaping Use   Vaping status: Never Used  Substance and Sexual Activity   Alcohol use: No   Drug use: Never   Sexual activity: Not on file  Other Topics Concern   Not on file  Social History Narrative    Right Handed     Lives in a one story with her husband    Social Drivers of Manufacturing engineer Strain: Not on file  Food Insecurity: Not on file  Transportation Needs: Not on file  Physical Activity: Not on file  Stress: Not on file  Social Connections: Not on file  Intimate Partner Violence: Not on file      Review of Systems:    Constitutional: No weight loss, fever or chills Cardiovascular: No chest pain Respiratory: No SOB  Gastrointestinal: See HPI and otherwise negative Genitourinary: No dysuria Neurological: No headache, dizziness or syncope Musculoskeletal: No new muscle or joint pain Hematologic: No bleeding Psychiatric: No history of depression  or anxiety    Physical Exam:  Vital signs: BP (!) 148/78   Pulse 82   Ht 4\' 11"  (1.499 m)   Wt 160 lb (72.6 kg)   BMI 32.32 kg/m     Constitutional:   Pleasant Elderly [place] female appears to be in NAD, Well developed, Well nourished, alert and cooperative Head:  Normocephalic and atraumatic. Eyes:   PEERL, EOMI. No icterus. Conjunctiva pink. Ears:  Normal auditory acuity. Neck:  Supple Throat: Oral cavity and pharynx without inflammation, swelling or lesion.  Respiratory: Respirations even and unlabored. Lungs clear to auscultation bilaterally.   No wheezes, crackles, or rhonchi.  Cardiovascular: Normal S1, S2. No MRG. Regular rate and rhythm. No peripheral edema, cyanosis or pallor.  Gastrointestinal:  Soft, nondistended, nontender. No rebound or guarding. Normal bowel sounds. No appreciable masses or hepatomegaly. Rectal:  Not performed.  Msk:  Symmetrical without gross deformities. Without edema, no deformity or joint abnormality.  Neurologic:  Alert and  oriented x4;  grossly normal neurologically.  Skin:   Dry and intact without significant lesions or rashes. Psychiatric: Demonstrates good judgement and reason without abnormal affect or behaviors.   RELEVANT LABS AND IMAGING: CBC Labs (Brief)          Component Value Date/Time    WBC 8.0 08/05/2023 1219    RBC 4.83 08/05/2023 1219    HGB 14.5 08/05/2023 1219    HGB 13.4 03/18/2018 0926    HCT 44.5 08/05/2023 1219    HCT  40.5 03/18/2018 0926    PLT 261.0 08/05/2023 1219    PLT 304 03/18/2018 0926    MCV 92.3 08/05/2023 1219    MCV 85 03/18/2018 0926    MCH 28.5 08/13/2022 0051    MCHC 32.4 08/05/2023 1219    RDW 14.9 08/05/2023 1219    RDW 13.3 03/18/2018 0926    LYMPHSABS 2.2 08/05/2023 1219    MONOABS 0.5 08/05/2023 1219    EOSABS 0.2 08/05/2023 1219    BASOSABS 0.0 08/05/2023 1219        CMP     Labs (Brief)          Component Value Date/Time    NA 140 08/05/2023 1219    NA 146 (H) 03/18/2018 0926     K 4.8 08/05/2023 1219    CL 103 08/05/2023 1219    CO2 29 08/05/2023 1219    GLUCOSE 117 (H) 08/05/2023 1219    BUN 21 08/05/2023 1219    BUN 24 03/18/2018 0926    CREATININE 0.79 08/05/2023 1219    CALCIUM  9.6 08/05/2023 1219    PROT 7.7 08/05/2023 1219    ALBUMIN 4.6 08/05/2023 1219    AST 26 08/05/2023 1219    ALT 25 08/05/2023 1219    ALKPHOS 104 08/05/2023 1219    BILITOT 0.5 08/05/2023 1219    GFRNONAA >60 08/13/2022 0051    GFRAA 64 03/18/2018 0926        Assessment: 1.  Change in bowel habits: Radiates back-and-forth from diarrhea to constipation; consider patient to diet +/- IBS 2.  Chronic nausea: For years, apparently unchanged even after Nissen fundoplication for paraesophageal hernia, no help from Zofran  or Reglan  in the past per patient; consider H. pylori versus PUD versus functional dyspepsia versus altered Nissen fundoplication 3.  History of adenomatous polyps: Last colonoscopy in June 2020, repeat due in June 4.  History of Nissen fundoplication: In 2022   Plan: 1.  Scheduled patient for diagnostic EGD and colonoscopy in the [facility] with Dr. [name].  Did provide the patient a detailed list of risks for the procedures and she agrees to proceed. Patient is appropriate for endoscopic procedure(s) in the ambulatory ([facility]) setting.  2.  Suggested that we try to schedule out Zofran  or even retry Reglan  but the patient does not want these medicines anymore because they never did anything. 3.  Patient does not feel like increasing Omeprazole  to twice daily will do anything either, so kept her on once daily dosing. 4.  Discussed adding back her fiber supplement as this typically helps with radiation of stools but she is unconvinced.  Recommend she increase water intake to at least eight 8 ounce glasses per day. 5.  Family will try to get stool study results from me so I can see about this positive parasite testing.  Apparently she was treated for SIBO as well as sounds like with  maybe Cipro and Flagyl  but also a probiotic at the same time.  May need to retest for this in the future. 6.  Patient to follow in clinic with us  per recommendations after time of procedure.   Reginal Capra, PA-C [facility] 08/18/2023, 9:09 AM

## 2023-09-17 ENCOUNTER — Telehealth: Payer: Self-pay

## 2023-09-17 NOTE — Telephone Encounter (Signed)
 Post procedure follow up call, no answer

## 2023-09-19 LAB — SURGICAL PATHOLOGY

## 2023-09-23 ENCOUNTER — Encounter: Payer: Self-pay | Admitting: Internal Medicine

## 2023-09-26 ENCOUNTER — Other Ambulatory Visit: Payer: Self-pay | Admitting: Internal Medicine

## 2023-09-26 NOTE — Telephone Encounter (Signed)
 Copied from CRM [id number]. Topic: Clinical - Medication Refill >> Sep 26, 2023  2:26 PM Adonis Hoot wrote: Most Recent Primary Care Visit:  Provider: Genia Kettering  Department: [facility]  Visit Type: OFFICE VISIT  Date: 08/05/2023  Medication: spironolactone  (ALDACTONE ) 25 MG tablet carvedilol  (COREG ) 6.25 MG tablet  Has the patient contacted their pharmacy? Yes (Agent: If no, request that the patient contact the pharmacy for the refill. If patient does not wish to contact the pharmacy document the reason why and proceed with request.) (Agent: If yes, when and what did the pharmacy advise?)  Is this the correct pharmacy for this prescription? Yes If no, delete pharmacy and type the correct one.  This is the patient's preferred pharmacy:  [id number] Jonette Nestle, [place] - [address] AT [facility] [address] & [address] [place] [address] Phone: [phone] Fax: [phone]    Has the prescription been filled recently? No  Is the patient out of the medication? Yes  Has the patient been seen for an appointment in the last year OR does the patient have an upcoming appointment? Yes  Can we respond through MyChart? Yes  Agent: Please be advised that Rx refills may take up to 3 business days. We ask that you follow-up with your pharmacy.

## 2023-09-29 MED ORDER — SPIRONOLACTONE 25 MG PO TABS: 25.0000 mg | ORAL_TABLET | Freq: Every day | ORAL | 3 refills | Status: DC

## 2023-09-29 MED ORDER — CARVEDILOL 6.25 MG PO TABS: 6.2500 mg | ORAL_TABLET | Freq: Two times a day (BID) | ORAL | 11 refills | Status: DC

## 2023-09-30 DIAGNOSIS — M24575 Contracture, left foot: Secondary | ICD-10-CM | POA: Diagnosis not present

## 2023-09-30 DIAGNOSIS — M21612 Bunion of left foot: Secondary | ICD-10-CM | POA: Diagnosis not present

## 2023-09-30 DIAGNOSIS — M2042 Other hammer toe(s) (acquired), left foot: Secondary | ICD-10-CM | POA: Diagnosis not present

## 2023-09-30 DIAGNOSIS — M62472 Contracture of muscle, left ankle and foot: Secondary | ICD-10-CM | POA: Diagnosis not present

## 2023-09-30 DIAGNOSIS — M7742 Metatarsalgia, left foot: Secondary | ICD-10-CM | POA: Diagnosis not present

## 2023-10-01 ENCOUNTER — Other Ambulatory Visit: Payer: Self-pay | Admitting: Internal Medicine

## 2023-10-22 ENCOUNTER — Telehealth: Payer: Self-pay | Admitting: Internal Medicine

## 2023-10-22 NOTE — Telephone Encounter (Signed)
 Copied from CRM [id number]. Topic: General - Other >> Oct 22, 2023  8:39 AM Annelle Kiel wrote: Reason for CRM: patient daughter called in requesting for patient to get a referral for a physiatrist that speaks her language also patient daughter would like a call back regarding the information for patient cardiology she was referred to a while back daughters call back number is >> Oct 22, 2023  8:48 AM Annelle Kiel wrote: [phone]    daughters call back number

## 2023-10-24 NOTE — Telephone Encounter (Signed)
 I am not aware of any, unfortunately.  Only [place] speaking psychiatrists are available in the area. Thank you

## 2023-10-27 ENCOUNTER — Ambulatory Visit (INDEPENDENT_AMBULATORY_CARE_PROVIDER_SITE_OTHER): Payer: Medicare Other | Admitting: Neurology

## 2023-10-27 ENCOUNTER — Encounter: Payer: Self-pay | Admitting: Neurology

## 2023-10-27 VITALS — BP 118/66 | HR 58 | Ht 59.0 in | Wt 161.0 lb

## 2023-10-27 DIAGNOSIS — I447 Left bundle-branch block, unspecified: Secondary | ICD-10-CM | POA: Diagnosis not present

## 2023-10-27 DIAGNOSIS — R0609 Other forms of dyspnea: Secondary | ICD-10-CM | POA: Diagnosis not present

## 2023-10-27 DIAGNOSIS — I679 Cerebrovascular disease, unspecified: Secondary | ICD-10-CM | POA: Diagnosis not present

## 2023-10-27 DIAGNOSIS — I10 Essential (primary) hypertension: Secondary | ICD-10-CM | POA: Diagnosis not present

## 2023-10-27 DIAGNOSIS — R001 Bradycardia, unspecified: Secondary | ICD-10-CM | POA: Diagnosis not present

## 2023-10-27 DIAGNOSIS — R42 Dizziness and giddiness: Secondary | ICD-10-CM | POA: Diagnosis not present

## 2023-10-27 DIAGNOSIS — I6523 Occlusion and stenosis of bilateral carotid arteries: Secondary | ICD-10-CM

## 2023-10-27 DIAGNOSIS — E785 Hyperlipidemia, unspecified: Secondary | ICD-10-CM | POA: Diagnosis not present

## 2023-10-27 NOTE — Progress Notes (Signed)
 Follow-up Visit   Date: 10/27/2023    Susan Keith MRN: [id number] DOB: 1945/06/03    Susan Keith is a 78 y.o. right-handed Caucasian female with  hypertension, hypothyroidism, and GERD returning to the clinic for follow-up of intracranial stenosis.  The patient was accompanied to the clinic by daughter and Thamas Fillers interpretor who also provides collateral information.    IMPRESSION/PLAN: Cerebral intravascular stenosis with bilateral ICA (RICA 1-39%, LICA 40-59% on US ).  She had not had any interval neurological symptoms.  She has been noncompliant with aspirin  and simvastatin  and reports to taking it about once per week.  I have encouraged medication compliance and asked her to keep them in her medicine pillbox.  Tobacco cessation also advised.   She is overdue to follow-up with Vascular Surgery for known carotid stenosis and I have provided her with their telephone number to schedule visit and ultrasound.  From a neurological standpoint, medication compliance was stressed. I will check CTA head to look for interval change.   Lightheadedness is most consistent with orthostasis.  She is seeing PCP later today for BP management.   Return to clinic in 1 year  --------------------------------------------- History of present illness: She was visiting her husband in the hospital and developed dizziness.  Her blood pressure was elevated in 200s when nursing staff checked her, so EMS was called.  BP in the ER was 157/70 and HR 62 and symptoms slowly started to improve.  She reports symptoms of lightheadedness moreso than spinning sensation.  She had CTA head and neck which showed intracranial atherosclerosis most notably with bilateral intracranial ICA calcification at the siphons, as well as left ICA bulb 75% stenosis.  MRI brain did not show acute stroke.  She has not had any further spells.  She was evaluated for carotid disease by vascular surgery on 4/2 who performed carotid  ultrasound which shows left ICA stenosis to be 40-59%.  Because symptoms of lightheadedness were not felt to be related to her carotid disease, she was managed medically and started on aspirin  and statin therapy.  She has not had any further spells.   She does endorses having headaches when her blood pressure is elevated. Headaches are dull and episodic.  No nausea, vomiting, photophobia, or phonophobia.    Of note, her CTA did mention abnormality in the submandibular gland and recommended ENT evaluation.     UPDATE 10/27/2023: She is here for 1 year follow-up.  She denies any new neurological symptoms or TIA symptoms over the past year.  She has not been taking her aspirin  regularly and says that she takes it about once per week.  Same with her simvastatin .  She is currently no smoking, but has not quit. She complains of lightheadedness upon getting out of bed or standing up.  It does not occur daily, but tends to occur more with positional changes.  She does not have symptoms when sitting or laying.  Symptoms self-resolve within about 10 min.    Medications:  Current Outpatient Medications on File Prior to Visit  Medication Sig Dispense Refill   amLODipine  (NORVASC ) 5 MG tablet TAKE 1 TABLET(5 MG) BY MOUTH DAILY 90 tablet 3   carvedilol  (COREG ) 6.25 MG tablet Take 1 tablet (6.25 mg total) by mouth 2 (two) times daily with a meal. 60 tablet 11   fluticasone (FLONASE) 50 MCG/ACT nasal spray Place 2 sprays into both nostrils daily.     glucose blood (ACCU-CHEK GUIDE TEST) test strip Use as instructed every  day prn 50 each 11   levothyroxine  (SYNTHROID ) 88 MCG tablet TAKE 1 TABLET BY MOUTH DAILY BEFORE BREAKFAST. 90 tablet 3   omeprazole  (PRILOSEC) 40 MG capsule Take 1 capsule (40 mg total) by mouth daily. I capsule daily before breakfast 90 capsule 3   repaglinide  (PRANDIN ) 0.5 MG tablet TAKE 1 TABLET(0.5 MG) BY MOUTH THREE TIMES DAILY BEFORE MEALS 270 tablet 1   spironolactone  (ALDACTONE ) 25 MG tablet  Take 1 tablet (25 mg total) by mouth daily. 90 tablet 3   terbinafine  (LAMISIL ) 250 MG tablet Take 1 tablet (250 mg total) by mouth daily. 90 tablet 0   loratadine  (CLARITIN ) 10 MG tablet Take 1 tablet (10 mg total) by mouth daily. (Patient not taking: Reported on 10/27/2023) 90 tablet 3   ondansetron  (ZOFRAN ) 4 MG tablet Take 1 tablet (4 mg total) by mouth every 6 (six) hours. (Patient not taking: Reported on 10/27/2023) 40 tablet 3   No current facility-administered medications on file prior to visit.    Allergies: No Known Allergies  Vital Signs:  BP 118/66   Pulse (!) 58   Ht 4\' 11"  (1.499 m)   Wt 161 lb (73 kg)   SpO2 94%   BMI 32.52 kg/m     Neurological Exam: MENTAL STATUS including orientation to time, place, person, recent and remote memory, attention span and concentration, language, and fund of knowledge is normal.  Speech is not dysarthric.  CRANIAL NERVES:  No visual field defects.  Pupils equal round and reactive to light.  Normal conjugate, extra-ocular eye movements in all directions of gaze.  No ptosis.  Face is symmetric. Palate elevates symmetrically.  Tongue is midline.  MOTOR:  Motor strength is 5/5 in all extremities.  No atrophy, fasciculations or abnormal movements.  No pronator drift.  Tone is normal.    MSRs:  Reflexes are 2+/4 throughout.  SENSORY:  Intact to vibration throughout.  COORDINATION/GAIT:  Normal finger-to- nose-finger.  Intact rapid alternating movements bilaterally.  Gait not tested today.   Data: US  carotids 08/26/2022: Right Carotid: Velocities in the right ICA are consistent with a 1-39% stenosis.   Left Carotid: Velocities in the left ICA are consistent with a 40-59% stenosis.   Vertebrals: Bilateral vertebral arteries demonstrate antegrade flow.  Subclavians: Normal flow hemodynamics were seen in bilateral subclavian               arteries.   MRI brain wo contrast 08/13/2022 1. No acute intracranial abnormality. Advanced signal  changes in the bilateral cerebral white matter and deep gray nuclei, nonspecific but most commonly due to chronic small vessel disease.   2. Right superior submandibular gland region T1 hypointense nodule corresponding to the CTA finding. It remains unclear whether this is within the gland or a small adjacent lymph node. Follow up recommendations as per the CTA.   3. Left mastoid air cell effusion, likely postinflammatory and significance doubtful.   CTA head and neck 10/22/2022: 1. Negative for large vessel occlusion. No acute intracranial abnormality by CT. Advanced cerebral white matter changes with heterogeneity in the deep gray nuclei compatible with small vessel disease.   2. Positive for bulky calcified plaque at the Left ICA bulb with High-grade stenosis numerically estimated at 75%, approaching a RADIOGRAPHIC STRING SIGN.   3. Superimposed heavily calcified Bilateral ICA siphons. Moderate left and moderate to severe right cavernous and supraclinoid segment stenoses. Other atherosclerosis at the distal basilar artery, left PCA, left A1 and M1 without significant stenosis.  4. In the right Neck there is a maximal right level 1 B lymph node versus 10 mm mass of the right submandibular gland. Cannot exclude a small salivary gland neoplasm (benign or malignant). Recommend follow-up with ENT and routine protocol Neck CT with IV contrast.   5. Retained secretions in the trachea at the thoracic inlet. Aortic Atherosclerosis (ICD10-I70.0)      Thank you for allowing me to participate in patient's care.  If I can answer any additional questions, I would be pleased to do so.    Sincerely,    Indy Kuck K. Lydia Sams, DO

## 2023-10-27 NOTE — Patient Instructions (Addendum)
 CTA head will be ordered.  Take aspirin  and statin medication daily.   Stop smoking.  Please follow-up with your vascular specialist, Dr. Jimmye Moulds. Phone: [phone]

## 2023-10-30 NOTE — Telephone Encounter (Signed)
 Called and left detailed voice message for patient's daughter informing her of Dr.Plotnikov's response

## 2023-11-03 ENCOUNTER — Telehealth: Payer: Self-pay | Admitting: Internal Medicine

## 2023-11-03 ENCOUNTER — Ambulatory Visit (INDEPENDENT_AMBULATORY_CARE_PROVIDER_SITE_OTHER): Admitting: Internal Medicine

## 2023-11-03 ENCOUNTER — Encounter: Payer: Self-pay | Admitting: Internal Medicine

## 2023-11-03 VITALS — BP 118/70 | HR 60 | Temp 98.1°F | Ht 59.0 in | Wt 159.0 lb

## 2023-11-03 DIAGNOSIS — F439 Reaction to severe stress, unspecified: Secondary | ICD-10-CM | POA: Diagnosis not present

## 2023-11-03 DIAGNOSIS — N183 Chronic kidney disease, stage 3 unspecified: Secondary | ICD-10-CM

## 2023-11-03 DIAGNOSIS — I10 Essential (primary) hypertension: Secondary | ICD-10-CM | POA: Diagnosis not present

## 2023-11-03 DIAGNOSIS — E1169 Type 2 diabetes mellitus with other specified complication: Secondary | ICD-10-CM | POA: Diagnosis not present

## 2023-11-03 DIAGNOSIS — M25551 Pain in right hip: Secondary | ICD-10-CM

## 2023-11-03 LAB — CBC WITH DIFFERENTIAL/PLATELET
Basophils Absolute: 0 10*3/uL (ref 0.0–0.1)
Basophils Relative: 0.4 % (ref 0.0–3.0)
Eosinophils Absolute: 0.1 10*3/uL (ref 0.0–0.7)
Eosinophils Relative: 1.6 % (ref 0.0–5.0)
HCT: 43.2 % (ref 36.0–46.0)
Hemoglobin: 14.2 g/dL (ref 12.0–15.0)
Lymphocytes Relative: 30 % (ref 12.0–46.0)
Lymphs Abs: 2.3 10*3/uL (ref 0.7–4.0)
MCHC: 32.8 g/dL (ref 30.0–36.0)
MCV: 89.7 fl (ref 78.0–100.0)
Monocytes Absolute: 0.6 10*3/uL (ref 0.1–1.0)
Monocytes Relative: 7.7 % (ref 3.0–12.0)
Neutro Abs: 4.7 10*3/uL (ref 1.4–7.7)
Neutrophils Relative %: 60.3 % (ref 43.0–77.0)
Platelets: 276 10*3/uL (ref 150.0–400.0)
RBC: 4.81 Mil/uL (ref 3.87–5.11)
RDW: 13.3 % (ref 11.5–15.5)
WBC: 7.8 10*3/uL (ref 4.0–10.5)

## 2023-11-03 LAB — COMPREHENSIVE METABOLIC PANEL WITH GFR
ALT: 13 U/L (ref 0–35)
AST: 15 U/L (ref 0–37)
Albumin: 4.5 g/dL (ref 3.5–5.2)
Alkaline Phosphatase: 99 U/L (ref 39–117)
BUN: 29 mg/dL — ABNORMAL HIGH (ref 6–23)
CO2: 29 meq/L (ref 19–32)
Calcium: 9.6 mg/dL (ref 8.4–10.5)
Chloride: 103 meq/L (ref 96–112)
Creatinine, Ser: 0.97 mg/dL (ref 0.40–1.20)
GFR: 57.11 mL/min — ABNORMAL LOW (ref >60.00)
Glucose, Bld: 119 mg/dL — ABNORMAL HIGH (ref 70–99)
Potassium: 4.9 meq/L (ref 3.5–5.1)
Sodium: 139 meq/L (ref 135–145)
Total Bilirubin: 0.6 mg/dL (ref 0.2–1.2)
Total Protein: 7.4 g/dL (ref 6.0–8.3)

## 2023-11-03 LAB — URINALYSIS, ROUTINE W REFLEX MICROSCOPIC
Bilirubin Urine: NEGATIVE
Hgb urine dipstick: NEGATIVE
Nitrite: NEGATIVE
RBC / HPF: NONE SEEN (ref 0–?)
Specific Gravity, Urine: 1.01 (ref 1.000–1.030)
Total Protein, Urine: NEGATIVE
Urine Glucose: NEGATIVE
Urobilinogen, UA: 0.2 (ref 0.0–1.0)
pH: 6 (ref 5.0–8.0)

## 2023-11-03 LAB — HEMOGLOBIN A1C: Hgb A1c MFr Bld: 7 % — ABNORMAL HIGH (ref 4.6–6.5)

## 2023-11-03 MED ORDER — ALPRAZOLAM 0.5 MG PO TABS: 0.5000 mg | ORAL_TABLET | Freq: Two times a day (BID) | ORAL | 1 refills | Status: AC | PRN

## 2023-11-03 NOTE — Telephone Encounter (Signed)
 Copied from CRM [id number]. Topic: Referral - Request for Referral >> Nov 03, 2023 12:39 PM Chasity T wrote: Did the patient discuss referral with their provider in the last year? Yes  Appointment offered? No  Type of order/referral and detailed reason for visit: orthopedic due to hip condition  Preference of office, provider, location: Dr Darden Eaves, [address] [place], [place] [address] fax number: [phone]  If referral order, have you been seen by this specialty before? No (If Yes, this issue or another issue? When? Where?  Can we respond through MyChart? No

## 2023-11-03 NOTE — Addendum Note (Signed)
 Addended by: Sheril Hammond V on: 11/03/2023 01:31 PM   Modules accepted: Level of Service

## 2023-11-03 NOTE — Assessment & Plan Note (Signed)
On Prandin to 1 mg with meals.

## 2023-11-03 NOTE — Assessment & Plan Note (Signed)
 Read Kimble Pennant "Sociopath next Door" Xanax prn  Potential benefits of a short/long term benzos use as well as potential risks (i.e. addiction risk, apnea etc) and complications (i.e. somnolence, and others) were explained to the patient and were aknowledged.

## 2023-11-03 NOTE — Assessment & Plan Note (Signed)
Monitor GFR Hydrate well 
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
## 2023-11-03 NOTE — Patient Instructions (Addendum)
 Susan Keith "Sociopath next Door"

## 2023-11-03 NOTE — Assessment & Plan Note (Signed)
 On Lisinopril, Aldactone, Amlodipine D/c Lisinopril (?side effects)- start Losartan instead

## 2023-11-04 ENCOUNTER — Ambulatory Visit: Admitting: Internal Medicine

## 2023-11-04 ENCOUNTER — Ambulatory Visit
Admission: RE | Admit: 2023-11-04 | Discharge: 2023-11-04 | Disposition: A | Discharge status: Home / Self Care | Source: Ambulatory Visit | Attending: Neurology | Admitting: Neurology

## 2023-11-04 DIAGNOSIS — I6523 Occlusion and stenosis of bilateral carotid arteries: Secondary | ICD-10-CM | POA: Diagnosis not present

## 2023-11-04 DIAGNOSIS — I679 Cerebrovascular disease, unspecified: Secondary | ICD-10-CM

## 2023-11-04 DIAGNOSIS — I6782 Cerebral ischemia: Secondary | ICD-10-CM | POA: Diagnosis not present

## 2023-11-04 MED ORDER — IOPAMIDOL (ISOVUE-370) INJECTION 76%
50.0000 mL | Freq: Once | INTRAVENOUS | Status: AC | PRN
  Administered 2023-11-04: 50 mL via INTRAVENOUS

## 2023-11-05 NOTE — Telephone Encounter (Signed)
 Okay.  Thanks.

## 2023-11-06 DIAGNOSIS — I10 Essential (primary) hypertension: Secondary | ICD-10-CM | POA: Diagnosis not present

## 2023-11-06 LAB — TSH: TSH: 0.84 u[IU]/mL (ref 0.35–5.50)

## 2023-11-10 ENCOUNTER — Ambulatory Visit: Payer: Self-pay | Admitting: Internal Medicine

## 2023-11-19 DIAGNOSIS — M21612 Bunion of left foot: Secondary | ICD-10-CM | POA: Diagnosis not present

## 2023-11-21 DIAGNOSIS — R001 Bradycardia, unspecified: Secondary | ICD-10-CM | POA: Diagnosis not present

## 2023-11-21 DIAGNOSIS — I491 Atrial premature depolarization: Secondary | ICD-10-CM | POA: Diagnosis not present

## 2023-11-21 DIAGNOSIS — I472 Ventricular tachycardia, unspecified: Secondary | ICD-10-CM | POA: Diagnosis not present

## 2023-11-21 DIAGNOSIS — I4719 Other supraventricular tachycardia: Secondary | ICD-10-CM | POA: Diagnosis not present

## 2023-11-24 ENCOUNTER — Ambulatory Visit: Payer: Self-pay | Admitting: Neurology

## 2023-12-04 ENCOUNTER — Telehealth: Payer: Self-pay

## 2023-12-04 ENCOUNTER — Other Ambulatory Visit (HOSPITAL_COMMUNITY): Payer: Self-pay

## 2023-12-04 NOTE — Telephone Encounter (Signed)
 Pharmacy Patient Advocate Encounter   Received notification from Onbase that prior authorization for Accu-check guide test strips is required/requested.   Insurance verification completed.   The patient is insured through [employer] .   Per test claim: Medication is not eligible for pharmacy benefits and must be billed through medical insurance. As our team only handles pharmacy related prior auths, medical PA's must be submitted by the clinic. Thank you

## 2023-12-05 DIAGNOSIS — G5702 Lesion of sciatic nerve, left lower limb: Secondary | ICD-10-CM | POA: Diagnosis not present

## 2023-12-05 DIAGNOSIS — M5416 Radiculopathy, lumbar region: Secondary | ICD-10-CM | POA: Diagnosis not present

## 2023-12-05 DIAGNOSIS — M47816 Spondylosis without myelopathy or radiculopathy, lumbar region: Secondary | ICD-10-CM | POA: Diagnosis not present

## 2023-12-05 DIAGNOSIS — R52 Pain, unspecified: Secondary | ICD-10-CM | POA: Diagnosis not present

## 2023-12-09 ENCOUNTER — Other Ambulatory Visit: Payer: Self-pay

## 2023-12-09 DIAGNOSIS — I6523 Occlusion and stenosis of bilateral carotid arteries: Secondary | ICD-10-CM

## 2023-12-10 ENCOUNTER — Other Ambulatory Visit (HOSPITAL_COMMUNITY): Payer: Self-pay

## 2023-12-10 DIAGNOSIS — I491 Atrial premature depolarization: Secondary | ICD-10-CM | POA: Diagnosis not present

## 2023-12-10 DIAGNOSIS — R001 Bradycardia, unspecified: Secondary | ICD-10-CM | POA: Diagnosis not present

## 2023-12-10 DIAGNOSIS — I472 Ventricular tachycardia, unspecified: Secondary | ICD-10-CM | POA: Diagnosis not present

## 2023-12-10 DIAGNOSIS — I4719 Other supraventricular tachycardia: Secondary | ICD-10-CM | POA: Diagnosis not present

## 2023-12-15 ENCOUNTER — Ambulatory Visit: Admitting: Podiatry

## 2023-12-17 DIAGNOSIS — Z4889 Encounter for other specified surgical aftercare: Secondary | ICD-10-CM | POA: Diagnosis not present

## 2023-12-17 DIAGNOSIS — M21619 Bunion of unspecified foot: Secondary | ICD-10-CM | POA: Diagnosis not present

## 2023-12-23 ENCOUNTER — Ambulatory Visit (HOSPITAL_COMMUNITY)
Admission: RE | Admit: 2023-12-23 | Discharge: 2023-12-23 | Disposition: A | Discharge status: Home / Self Care | Source: Ambulatory Visit | Attending: Vascular Surgery | Admitting: Vascular Surgery

## 2023-12-23 ENCOUNTER — Ambulatory Visit (INDEPENDENT_AMBULATORY_CARE_PROVIDER_SITE_OTHER): Admitting: Vascular Surgery

## 2023-12-23 ENCOUNTER — Encounter: Payer: Self-pay | Admitting: Vascular Surgery

## 2023-12-23 VITALS — BP 116/70 | HR 60 | Temp 98.1°F | Resp 18 | Ht 59.0 in | Wt 159.4 lb

## 2023-12-23 DIAGNOSIS — I6523 Occlusion and stenosis of bilateral carotid arteries: Secondary | ICD-10-CM | POA: Diagnosis not present

## 2023-12-23 NOTE — Progress Notes (Signed)
 Patient name: Susan Keith MRN: [id number] DOB: 1946/02/20 Sex: female  REASON FOR CONSULT: Surveillance carotid artery disease  HPI: Susan Keith is a 78 y.o. female, with history of hypertension and hyperlipidemia that presents for ongoing surveillance of carotid artery disease.  She was in the ED on 08/13/2022 with dizziness.  A CTA head and neck was ordered.  There was evidence of a high-grade stenosis in the left ICA with a 75% stenosis and also bilateral intracranial disease with ICA siphon stenosis.SABRA  MRI brain showed no acute intracranial abnormality.  She denies any further episodes of dizziness or passing out since she was seen in the ED.    Carotid duplex on 08/26/22 was 1-39% right ICA stenosis and 40-59% left ICA stenosis with antegrade flow in both vertebral arteries.SABRA  Ultimately we managed her medically.  We did not feel her left carotid stenosis met criteria for surgery.  Past Medical History:  Diagnosis Date   Allergic rhinitis    Arthritis    right hip (01/07/2017)   Diabetes mellitus without complication (HCC) 2017   Fibroids    they've decreased in size (01/07/2017)   GERD (gastroesophageal reflux disease)    Hiatal hernia    High cholesterol    Hypertension    Left bundle branch block    Osteoporosis    Pre-diabetes    Thyroid  disease    on synthroid    Vitamin D deficiency     Past Surgical History:  Procedure Laterality Date   APPENDECTOMY     COLONOSCOPY  2017   with endoscopy   FRACTURE SURGERY     INSERTION OF MESH N/A 08/14/2021   Procedure: INSERTION OF MESH;  Surgeon: Rubin Calamity, MD;  Location: [facility];  Service: General;  Laterality: N/A;   UPPER GASTROINTESTINAL ENDOSCOPY  2019   WRIST FRACTURE SURGERY Right 05/2015   XI ROBOTIC ASSISTED HIATAL HERNIA REPAIR N/A 08/14/2021   Procedure: XI ROBOTIC ASSISTED HIATAL HERNIA REPAIR WITH FUNDOPLICATION;  Surgeon: Rubin Calamity, MD;  Location: [facility] OR;  Service: General;  Laterality: N/A;     Family History  Problem Relation Age of Onset   Diabetes Mother    Hypertension Mother    Diabetes Father    Colon cancer Neg Hx    Colon polyps Neg Hx    Rectal cancer Neg Hx    Stomach cancer Neg Hx    Esophageal cancer Neg Hx     SOCIAL HISTORY: Social History   Socioeconomic History   Marital status: Married    Spouse name: Not on file   Number of children: 2   Years of education: Not on file   Highest education level: Not on file  Occupational History   Not on file  Tobacco Use   Smoking status: Every Day    Current packs/day: 0.25    Average packs/day: 0.3 packs/day for 48.0 years (12.0 ttl pk-yrs)    Types: Cigarettes   Smokeless tobacco: Never  Vaping Use   Vaping status: Never Used  Substance and Sexual Activity   Alcohol use: No   Drug use: Never   Sexual activity: Not on file  Other Topics Concern   Not on file  Social History Narrative   Right Handed    Lives in a one story with her husband   Social Drivers of Corporate investment banker Strain: Not on file  Food Insecurity: Not on file  Transportation Needs: Not on file  Physical Activity: Not on file  Stress: Not on file  Social Connections: Not on file  Intimate Partner Violence: Not on file    Allergies  Allergen Reactions   Latex Nausea Only    Added based on information entered during case entry, please review and add reactions, type, and severity as needed  latex    Current Outpatient Medications  Medication Sig Dispense Refill   ALPRAZolam  (XANAX ) 0.5 MG tablet Take 1 tablet (0.5 mg total) by mouth 2 (two) times daily as needed for anxiety. 60 tablet 1   amLODipine  (NORVASC ) 5 MG tablet TAKE 1 TABLET(5 MG) BY MOUTH DAILY 90 tablet 3   carvedilol  (COREG ) 6.25 MG tablet Take 1 tablet (6.25 mg total) by mouth 2 (two) times daily with a meal. 60 tablet 11   fluticasone (FLONASE) 50 MCG/ACT nasal spray Place 2 sprays into both nostrils daily.     glucose blood (ACCU-CHEK GUIDE  TEST) test strip Use as instructed every day prn 50 each 11   levothyroxine  (SYNTHROID ) 88 MCG tablet TAKE 1 TABLET BY MOUTH DAILY BEFORE BREAKFAST. 90 tablet 3   losartan  (COZAAR ) 100 MG tablet Take 1 tablet by mouth daily.     omeprazole  (PRILOSEC) 40 MG capsule Take 1 capsule (40 mg total) by mouth daily. I capsule daily before breakfast 90 capsule 3   oxyCODONE  (OXY IR/ROXICODONE ) 5 MG immediate release tablet Take 1 tablet every 6 hours by oral route as needed for 3 days.     repaglinide  (PRANDIN ) 0.5 MG tablet TAKE 1 TABLET(0.5 MG) BY MOUTH THREE TIMES DAILY BEFORE MEALS 270 tablet 1   spironolactone  (ALDACTONE ) 25 MG tablet Take 1 tablet (25 mg total) by mouth daily. 90 tablet 3   SUFLAVE  178.7 g SOLR Take 1 kit by mouth as directed.     terbinafine  (LAMISIL ) 250 MG tablet Take 1 tablet (250 mg total) by mouth daily. 90 tablet 0   loratadine  (CLARITIN ) 10 MG tablet Take 1 tablet (10 mg total) by mouth daily. (Patient not taking: Reported on 12/23/2023) 90 tablet 3   ondansetron  (ZOFRAN ) 4 MG tablet Take 1 tablet (4 mg total) by mouth every 6 (six) hours. (Patient not taking: Reported on 12/23/2023) 40 tablet 3   No current facility-administered medications for this visit.    REVIEW OF SYSTEMS:  [X]  denotes positive finding, [ ]  denotes negative finding Cardiac  Comments:  Chest pain or chest pressure:    Shortness of breath upon exertion:    Short of breath when lying flat:    Irregular heart rhythm:        Vascular    Pain in calf, thigh, or hip brought on by ambulation:    Pain in feet at night that wakes you up from your sleep:     Blood clot in your veins:    Leg swelling:         Pulmonary    Oxygen at home:    Productive cough:     Wheezing:         Neurologic    Sudden weakness in arms or legs:     Sudden numbness in arms or legs:     Sudden onset of difficulty speaking or slurred speech:    Temporary loss of vision in one eye:     Problems with dizziness:          Gastrointestinal    Blood in stool:     Vomited blood:         Genitourinary  Burning when urinating:     Blood in urine:        Psychiatric    Major depression:         Hematologic    Bleeding problems:    Problems with blood clotting too easily:        Skin    Rashes or ulcers:        Constitutional    Fever or chills:      PHYSICAL EXAM: Vitals:   12/23/23 1125 12/23/23 1127  BP: 130/71 116/70  Pulse: 60   Resp: 18   Temp: 98.1 F (36.7 C)   TempSrc: Temporal   SpO2: 96%   Weight: 159 lb 6.4 oz (72.3 kg)   Height: 4' 11 (1.499 m)     GENERAL: The patient is a well-nourished female, in no acute distress. The vital signs are documented above. CARDIAC: There is a regular rate and rhythm.  VASCULAR:  No previous neck incisions PULMONARY: No respiratory distress. ABDOMEN: Soft and non-tender. MUSCULOSKELETAL: There are no major deformities or cyanosis. NEUROLOGIC: No focal weakness or paresthesias are detected.  CN II-XII grossly intact. SKIN: There are no ulcers or rashes noted. PSYCHIATRIC: The patient has a normal affect.  DATA:   Carotid duplex today again shows 1 to 39% right ICA stenosis 40 to 59% left ICA stenosis  CTA head and neck 08/13/2022 with concern for 75% stenosis in the left ICA, less than 50% right carotid stenosis  Assessment/Plan:  78 year old female presents for ongoing surveillance of carotid artery disease.  Patient was in the hospital and evaluated in the ED when she developed dizziness.  CTA head/neck was  ordered last year concerning for 75% stenosis in the left ICA.  I reviewed her images and I did not feel that her stenosis was that severe.  Her duplex last year showed only a 40 to 59% left ICA stenosis by velocity criteria.  Her duplex again shows a 40 to 59% left ICA stenosis today likely on the high end of that range.  Discussed I think her stenosis is approaching 60%.  This is asymptomatic.  Discussed we reserve surgical  intervention when it is greater than 80%.  I will see her in 1 year with repeat carotid US .  She is on aspirin  for risk reduction.     Lonni DOROTHA Gaskins, MD [facility] Office: [phone]

## 2023-12-25 ENCOUNTER — Ambulatory Visit: Admitting: Internal Medicine

## 2023-12-29 ENCOUNTER — Other Ambulatory Visit: Payer: Self-pay | Admitting: Vascular Surgery

## 2023-12-29 DIAGNOSIS — E785 Hyperlipidemia, unspecified: Secondary | ICD-10-CM | POA: Diagnosis not present

## 2023-12-29 DIAGNOSIS — I10 Essential (primary) hypertension: Secondary | ICD-10-CM | POA: Diagnosis not present

## 2023-12-29 DIAGNOSIS — I447 Left bundle-branch block, unspecified: Secondary | ICD-10-CM | POA: Diagnosis not present

## 2024-01-02 ENCOUNTER — Other Ambulatory Visit: Payer: Self-pay | Admitting: Internal Medicine

## 2024-01-02 MED ORDER — LEVOTHYROXINE SODIUM 88 MCG PO TABS: 88.0000 ug | ORAL_TABLET | Freq: Every day | ORAL | 3 refills | Status: AC

## 2024-01-02 NOTE — Telephone Encounter (Signed)
 Copied from CRM [id number]. Topic: Clinical - Medication Refill >> Jan 02, 2024 10:29 AM Larissa S wrote: Medication: levothyroxine  (SYNTHROID ) 88 MCG tablet  Has the patient contacted their pharmacy? No, pharmacy calling on behalf of patient (Agent: If no, request that the patient contact the pharmacy for the refill. If patient does not wish to contact the pharmacy document the reason why and proceed with request.) (Agent: If yes, when and what did the pharmacy advise?)  This is the patient's preferred pharmacy:  [id number] [address], [place] - [address] AT [facility] OF [address] [place] [address] Phone: [phone] Fax: [phone]   Is this the correct pharmacy for this prescription? Yes If no, delete pharmacy and type the correct one.   Has the prescription been filled recently? No  Is the patient out of the medication? Yes  Has the patient been seen for an appointment in the last year OR does the patient have an upcoming appointment? Yes  Can we respond through MyChart? No  Agent: Please be advised that Rx refills may take up to 3 business days. We ask that you follow-up with your pharmacy.

## 2024-01-06 ENCOUNTER — Other Ambulatory Visit: Payer: Self-pay | Admitting: Internal Medicine

## 2024-02-19 ENCOUNTER — Telehealth: Payer: Self-pay

## 2024-02-19 NOTE — Telephone Encounter (Signed)
 Copied from CRM [id number]. Topic: Medical Record Request - Other >> Feb 11, 2024  9:35 AM [place] J wrote: Reason for CRM: Delon calling from a medical supplier, wanting to verify if we received a fax. I let her know that I did not see anything scanned into the patient's chart but if she would like to resend it, that may help us  get the documents quicker. >> Feb 18, 2024  4:28 PM [place] J wrote: Medical supplier calling back to see if a fax for a back brace request was received. The representative let me know that this is something they've been trying to fax over to us  since June and they has still not received a response. Please call the number below for more information or to work with the medication supplier so we can provide the patient with the appropriate equipment.  Jennifer: [phone]

## 2024-04-05 ENCOUNTER — Encounter: Payer: Self-pay | Admitting: Internal Medicine

## 2024-04-05 ENCOUNTER — Ambulatory Visit: Admitting: Internal Medicine

## 2024-04-05 VITALS — BP 126/72 | HR 60 | Temp 98.0°F | Ht 59.0 in | Wt 149.8 lb

## 2024-04-05 DIAGNOSIS — Z7984 Long term (current) use of oral hypoglycemic drugs: Secondary | ICD-10-CM

## 2024-04-05 DIAGNOSIS — E785 Hyperlipidemia, unspecified: Secondary | ICD-10-CM | POA: Diagnosis not present

## 2024-04-05 DIAGNOSIS — E1169 Type 2 diabetes mellitus with other specified complication: Secondary | ICD-10-CM | POA: Diagnosis not present

## 2024-04-05 DIAGNOSIS — N2889 Other specified disorders of kidney and ureter: Secondary | ICD-10-CM

## 2024-04-05 DIAGNOSIS — J069 Acute upper respiratory infection, unspecified: Secondary | ICD-10-CM | POA: Diagnosis not present

## 2024-04-05 DIAGNOSIS — Z9889 Other specified postprocedural states: Secondary | ICD-10-CM | POA: Diagnosis not present

## 2024-04-05 DIAGNOSIS — I10 Essential (primary) hypertension: Secondary | ICD-10-CM

## 2024-04-05 MED ORDER — AMLODIPINE BESYLATE 5 MG PO TABS: ORAL_TABLET | ORAL | 3 refills | Status: AC

## 2024-04-05 MED ORDER — CARVEDILOL 6.25 MG PO TABS: 6.2500 mg | ORAL_TABLET | Freq: Two times a day (BID) | ORAL | 11 refills | Status: AC

## 2024-04-05 MED ORDER — REPAGLINIDE 0.5 MG PO TABS
0.5000 mg | ORAL_TABLET | Freq: Three times a day (TID) | ORAL | 3 refills | Status: AC
Start: 2024-04-05 — End: ?

## 2024-04-05 MED ORDER — PROMETHAZINE-DM 6.25-15 MG/5ML PO SYRP: 5.0000 mL | ORAL_SOLUTION | Freq: Four times a day (QID) | ORAL | 1 refills | Status: AC | PRN

## 2024-04-05 MED ORDER — SPIRONOLACTONE 25 MG PO TABS: 25.0000 mg | ORAL_TABLET | Freq: Every day | ORAL | 3 refills | Status: AC

## 2024-04-05 MED ORDER — CEFDINIR 300 MG PO CAPS
300.0000 mg | ORAL_CAPSULE | Freq: Two times a day (BID) | ORAL | 0 refills | Status: AC
Start: 2024-04-05 — End: ?

## 2024-04-05 MED ORDER — CHOLECALCIFEROL 100 MCG (4000 UT) PO CAPS: ORAL_CAPSULE | ORAL | 3 refills | Status: AC

## 2024-04-05 MED ORDER — ONDANSETRON HCL 4 MG PO TABS: 4.0000 mg | ORAL_TABLET | Freq: Four times a day (QID) | ORAL | 3 refills | Status: AC

## 2024-04-05 MED ORDER — OLMESARTAN MEDOXOMIL 20 MG PO TABS: 20.0000 mg | ORAL_TABLET | Freq: Every day | ORAL | 3 refills | Status: AC

## 2024-04-05 MED ORDER — EZETIMIBE 10 MG PO TABS: 10.0000 mg | ORAL_TABLET | Freq: Every day | ORAL | 3 refills | Status: AC

## 2024-04-05 NOTE — Progress Notes (Signed)
 Subjective:  Patient ID: Susan Keith, female    DOB: 1945-07-22  Age: 78 y.o. MRN: [id number]  CC: Medical Management of Chronic Issues (Discuss blood pressure medication. Patient had been prescribed blood pressure medication during trip to [place] by their cardiologist there, notes that this has been very successful for them.//Slight productive cough since being back in the [place] //Results for CT scans)   HPI Fredrica Anes presents for cough, URI symptoms The patient came back from [place] on 03/31/24 -she is complaining of diarrhea that started there.  They went to [place] because the patient's husband died there She is here with her daughter Follow-up on anxiety, hypertension, hypothyroidism, chronic renal insufficiency  Outpatient Medications Prior to Visit  Medication Sig Dispense Refill   ALPRAZolam  (XANAX ) 0.5 MG tablet Take 1 tablet (0.5 mg total) by mouth 2 (two) times daily as needed for anxiety. 60 tablet 1   fluticasone (FLONASE) 50 MCG/ACT nasal spray Place 2 sprays into both nostrils daily.     glucose blood (ACCU-CHEK GUIDE TEST) test strip Use as instructed every day prn 50 each 11   levothyroxine  (SYNTHROID ) 88 MCG tablet Take 1 tablet (88 mcg total) by mouth daily before breakfast. 90 tablet 3   omeprazole  (PRILOSEC) 40 MG capsule Take 1 capsule (40 mg total) by mouth daily. I capsule daily before breakfast 90 capsule 3   SUFLAVE  178.7 g SOLR Take 1 kit by mouth as directed.     terbinafine  (LAMISIL ) 250 MG tablet Take 1 tablet (250 mg total) by mouth daily. 90 tablet 0   amLODipine  (NORVASC ) 5 MG tablet TAKE 1 TABLET(5 MG) BY MOUTH DAILY 90 tablet 3   carvedilol  (COREG ) 6.25 MG tablet Take 1 tablet (6.25 mg total) by mouth 2 (two) times daily with a meal. 60 tablet 11   losartan  (COZAAR ) 100 MG tablet Take 1 tablet by mouth daily.     oxyCODONE  (OXY IR/ROXICODONE ) 5 MG immediate release tablet Take 1 tablet every 6 hours by oral route as needed for 3 days.      repaglinide  (PRANDIN ) 0.5 MG tablet TAKE 1 TABLET(0.5 MG) BY MOUTH THREE TIMES DAILY BEFORE MEALS 270 tablet 1   spironolactone  (ALDACTONE ) 25 MG tablet Take 1 tablet (25 mg total) by mouth daily. 90 tablet 3   loratadine  (CLARITIN ) 10 MG tablet Take 1 tablet (10 mg total) by mouth daily. (Patient not taking: Reported on 04/05/2024) 90 tablet 3   ondansetron  (ZOFRAN ) 4 MG tablet Take 1 tablet (4 mg total) by mouth every 6 (six) hours. (Patient not taking: Reported on 04/05/2024) 40 tablet 3   No facility-administered medications prior to visit.    ROS: Review of Systems  Constitutional:  Positive for fatigue. Negative for activity change, appetite change, chills and unexpected weight change.  HENT:  Negative for congestion, mouth sores and sinus pressure.   Eyes:  Negative for visual disturbance.  Respiratory:  Negative for cough and chest tightness.   Gastrointestinal:  Positive for nausea. Negative for abdominal pain.  Genitourinary:  Negative for difficulty urinating, frequency and vaginal pain.  Musculoskeletal:  Negative for back pain and gait problem.  Skin:  Negative for pallor and rash.  Neurological:  Negative for dizziness, tremors, weakness, numbness and headaches.  Psychiatric/Behavioral:  Positive for dysphoric mood. Negative for confusion, sleep disturbance and suicidal ideas.     Objective:  BP 126/72   Pulse 60   Temp 98 F (36.7 C)   Ht 4' 11 (1.499 m)   Wt 149  lb 12.8 oz (67.9 kg)   SpO2 97%   BMI 30.26 kg/m   BP Readings from Last 3 Encounters:  04/05/24 126/72  12/23/23 116/70  11/03/23 118/70    Wt Readings from Last 3 Encounters:  04/05/24 149 lb 12.8 oz (67.9 kg)  12/23/23 159 lb 6.4 oz (72.3 kg)  11/03/23 159 lb (72.1 kg)    Physical Exam Constitutional:      General: She is not in acute distress.    Appearance: She is well-developed. She is obese.  HENT:     Head: Normocephalic.     Right Ear: External ear normal.     Left Ear: External  ear normal.     Nose: Nose normal.     Mouth/Throat:     Pharynx: Posterior oropharyngeal erythema present.  Eyes:     General:        Right eye: No discharge.        Left eye: No discharge.     Conjunctiva/sclera: Conjunctivae normal.     Pupils: Pupils are equal, round, and reactive to light.  Neck:     Thyroid : No thyromegaly.     Vascular: No JVD.     Trachea: No tracheal deviation.  Cardiovascular:     Rate and Rhythm: Normal rate and regular rhythm.     Heart sounds: Normal heart sounds.  Pulmonary:     Effort: No respiratory distress.     Breath sounds: No stridor. Rhonchi present. No wheezing.  Abdominal:     General: Bowel sounds are normal. There is no distension.     Palpations: Abdomen is soft. There is no mass.     Tenderness: There is no abdominal tenderness. There is no guarding or rebound.  Musculoskeletal:        General: No tenderness.     Cervical back: Normal range of motion and neck supple. No rigidity.  Lymphadenopathy:     Cervical: No cervical adenopathy.  Skin:    Findings: No erythema or rash.  Neurological:     Cranial Nerves: No cranial nerve deficit.     Motor: No abnormal muscle tone.     Coordination: Coordination normal.     Deep Tendon Reflexes: Reflexes normal.  Psychiatric:        Behavior: Behavior normal.        Thought Content: Thought content normal.        Judgment: Judgment normal.   She is sad  Lab Results  Component Value Date   WBC 6.8 04/05/2024   HGB 14.2 04/05/2024   HCT 41.9 04/05/2024   PLT 265.0 04/05/2024   GLUCOSE 100 (H) 04/05/2024   CHOL 197 04/05/2024   TRIG 211.0 (H) 04/05/2024   HDL 59.20 04/05/2024   LDLCALC 95 04/05/2024   ALT 12 04/05/2024   AST 19 04/05/2024   NA 141 04/05/2024   K 4.6 04/05/2024   CL 103 04/05/2024   CREATININE 0.87 04/05/2024   BUN 14 04/05/2024   CO2 29 04/05/2024   TSH 0.53 04/05/2024   HGBA1C 6.6 (H) 04/05/2024    VAS US  CAROTID Result Date: 12/23/2023 Carotid Arterial  Duplex Study Patient Name:  Antonette Hendricks  Date of Exam:   12/23/2023 Medical Rec #: [id number]       Accession #:    [id number] Date of Birth: Oct 11, 1945      Patient Gender: F Patient Age:   67 years Exam Location:  [address] Procedure:      VAS US   CAROTID Referring Phys: LONNI GASKINS --------------------------------------------------------------------------------  Indications:       Carotid artery disease and patient reports worsening                    dizziness with presyncope episodes for the past 2 weeks. She                    also reports noticing floaters in her eyes. She denies any                    other cerebrovascular symptoms. Risk Factors:      Hypertension, hyperlipidemia, Diabetes, current smoker. Comparison Study:  On 08/26/2022, a carotid duplex showed velocities of 41/9                    cm/s in the RICA and 172/49 cm/s in the LICA. Performing Technologist: Nanetta Shad RVT  Examination Guidelines: A complete evaluation includes B-mode imaging, spectral Doppler, color Doppler, and power Doppler as needed of all accessible portions of each vessel. Bilateral testing is considered an integral part of a complete examination. Limited examinations for reoccurring indications may be performed as noted.  Right Carotid Findings: +----------+--------+--------+--------+----------------------+--------+           PSV cm/sEDV cm/sStenosisPlaque Description    Comments +----------+--------+--------+--------+----------------------+--------+ CCA Prox  66      12                                             +----------+--------+--------+--------+----------------------+--------+ CCA Distal72      16      <50%    heterogenous                   +----------+--------+--------+--------+----------------------+--------+ ICA Prox  60      17      1-39%   heterogenous and focal         +----------+--------+--------+--------+----------------------+--------+ ICA Distal57      21                                              +----------+--------+--------+--------+----------------------+--------+ ECA       66      9               heterogenous                   +----------+--------+--------+--------+----------------------+--------+ +----------+--------+-------+----------------+-------------------+           PSV cm/sEDV cmsDescribe        Arm Pressure (mmHG) +----------+--------+-------+----------------+-------------------+ [id number]     0      Multiphasic, [id number]                 +----------+--------+-------+----------------+-------------------+ +---------+--------+--+--------+--+---------+ VertebralPSV cm/s60EDV cm/s15Antegrade +---------+--------+--+--------+--+---------+  Left Carotid Findings: +----------+--------+--------+--------+------------------+------------------+           PSV cm/sEDV cm/sStenosisPlaque DescriptionComments           +----------+--------+--------+--------+------------------+------------------+ CCA Prox  66      11                                tortuous           +----------+--------+--------+--------+------------------+------------------+ CCA Distal67  15      <50%    heterogenous                         +----------+--------+--------+--------+------------------+------------------+ ICA Prox  37      10              calcific          Shadowing          +----------+--------+--------+--------+------------------+------------------+ ICA Mid   211     60      40-59%  calcific          Shadowing          +----------+--------+--------+--------+------------------+------------------+ ICA Distal82      24                                turbulent          +----------+--------+--------+--------+------------------+------------------+ ECA       55      9                                 intimal thickening +----------+--------+--------+--------+------------------+------------------+  +----------+--------+--------+----------------+-------------------+           PSV cm/sEDV cm/sDescribe        Arm Pressure (mmHG) +----------+--------+--------+----------------+-------------------+ Subclavian120     0       Multiphasic, TWO887                 +----------+--------+--------+----------------+-------------------+ +---------+--------+--+--------+-+---------+ VertebralPSV cm/s24EDV cm/s8Antegrade +---------+--------+--+--------+-+---------+   Summary: Right Carotid: Velocities in the right ICA are consistent with a 1-39% stenosis.                Non-hemodynamically significant plaque <50% noted in the CCA. Left Carotid: Velocities in the left ICA are consistent with a 40-59% stenosis,               high end range. Non-hemodynamically significant plaque <50% noted               in the CCA. Vertebrals:  Bilateral vertebral arteries demonstrate antegrade flow. Subclavians: Normal flow hemodynamics were seen in bilateral subclavian              arteries. *See table(s) above for measurements and observations.  Electronically signed by Lonni Gaskins MD on 12/23/2023 at 11:44:22 AM.    Final     Assessment & Plan:   Problem List Items Addressed This Visit     CRI (chronic renal insufficiency), stage 3 (moderate)   Monitor GFR Hydrate well      Relevant Orders   Comprehensive metabolic panel with GFR (Completed)   Hemoglobin A1c (Completed)   TSH (Completed)   T4, free (Completed)   Urinalysis   CBC with Differential/Platelet (Completed)   Diabetes mellitus (HCC)   On Prandin  to 1 mg with meals.  Obtain lab with hemoglobin A1c      Relevant Medications   olmesartan  (BENICAR ) 20 MG tablet   repaglinide  (PRANDIN ) 0.5 MG tablet   Other Relevant Orders   Comprehensive metabolic panel with GFR (Completed)   Hemoglobin A1c (Completed)   TSH (Completed)   T4, free (Completed)   Urinalysis   CBC with Differential/Platelet (Completed)   Dyslipidemia   Relevant  Medications   ezetimibe  (ZETIA ) 10 MG tablet   Other Relevant Orders   Lipid panel (Completed)   Essential hypertension  On losartan , Aldactone , Amlodipine         Relevant Medications   olmesartan  (BENICAR ) 20 MG tablet   amLODipine  (NORVASC ) 5 MG tablet   carvedilol  (COREG ) 6.25 MG tablet   ezetimibe  (ZETIA ) 10 MG tablet   spironolactone  (ALDACTONE ) 25 MG tablet   Other Relevant Orders   Comprehensive metabolic panel with GFR (Completed)   Hemoglobin A1c (Completed)   TSH (Completed)   T4, free (Completed)   Urinalysis   CBC with Differential/Platelet (Completed)   S/P Nissen fundoplication (without gastrostomy tube) procedure   Continue with Zofran  as needed nausea      Upper respiratory tract infection - Primary   New.  Prescribed cefdinir , promethazine  cough syrup.  She will try to stop smoking      Relevant Medications   cefdinir  (OMNICEF ) 300 MG capsule      Meds ordered this encounter  Medications   olmesartan  (BENICAR ) 20 MG tablet    Sig: Take 1 tablet (20 mg total) by mouth daily.    Dispense:  90 tablet    Refill:  3   amLODipine  (NORVASC ) 5 MG tablet    Sig: TAKE 1 TABLET(5 MG) BY MOUTH DAILY    Dispense:  90 tablet    Refill:  3   carvedilol  (COREG ) 6.25 MG tablet    Sig: Take 1 tablet (6.25 mg total) by mouth 2 (two) times daily with a meal.    Dispense:  60 tablet    Refill:  11   Cholecalciferol  100 MCG (4000 UT) CAPS    Sig: 1 po qd    Dispense:  100 capsule    Refill:  3   ondansetron  (ZOFRAN ) 4 MG tablet    Sig: Take 1 tablet (4 mg total) by mouth every 6 (six) hours.    Dispense:  40 tablet    Refill:  3   ezetimibe  (ZETIA ) 10 MG tablet    Sig: Take 1 tablet (10 mg total) by mouth daily.    Dispense:  90 tablet    Refill:  3   spironolactone  (ALDACTONE ) 25 MG tablet    Sig: Take 1 tablet (25 mg total) by mouth daily.    Dispense:  90 tablet    Refill:  3   cefdinir  (OMNICEF ) 300 MG capsule    Sig: Take 1 capsule (300 mg total) by  mouth 2 (two) times daily.    Dispense:  20 capsule    Refill:  0   promethazine -dextromethorphan (PROMETHAZINE -DM) 6.25-15 MG/5ML syrup    Sig: Take 5 mLs by mouth 4 (four) times daily as needed for cough.    Dispense:  240 mL    Refill:  1   repaglinide  (PRANDIN ) 0.5 MG tablet    Sig: Take 1 tablet (0.5 mg total) by mouth 3 (three) times daily before meals.    Dispense:  270 tablet    Refill:  3      Follow-up: Return in about 6 weeks (around 05/17/2024) for a follow-up visit.  Marolyn Noel, MD

## 2024-04-06 LAB — URINALYSIS, ROUTINE W REFLEX MICROSCOPIC
Bilirubin Urine: NEGATIVE
Hgb urine dipstick: NEGATIVE
Ketones, ur: NEGATIVE
Nitrite: NEGATIVE
RBC / HPF: NONE SEEN (ref 0–?)
Specific Gravity, Urine: 1.01 (ref 1.000–1.030)
Total Protein, Urine: NEGATIVE
Urine Glucose: NEGATIVE
Urobilinogen, UA: 0.2 (ref 0.0–1.0)
pH: 6.5 (ref 5.0–8.0)

## 2024-04-06 LAB — CBC WITH DIFFERENTIAL/PLATELET
Basophils Absolute: 0.1 K/uL (ref 0.0–0.1)
Basophils Relative: 1 % (ref 0.0–3.0)
Eosinophils Absolute: 0.2 K/uL (ref 0.0–0.7)
Eosinophils Relative: 2.4 % (ref 0.0–5.0)
HCT: 41.9 % (ref 36.0–46.0)
Hemoglobin: 14.2 g/dL (ref 12.0–15.0)
Lymphocytes Relative: 32.3 % (ref 12.0–46.0)
Lymphs Abs: 2.2 K/uL (ref 0.7–4.0)
MCHC: 33.9 g/dL (ref 30.0–36.0)
MCV: 91.9 fl (ref 78.0–100.0)
Monocytes Absolute: 0.8 K/uL (ref 0.1–1.0)
Monocytes Relative: 11.6 % (ref 3.0–12.0)
Neutro Abs: 3.6 K/uL (ref 1.4–7.7)
Neutrophils Relative %: 52.7 % (ref 43.0–77.0)
Platelets: 265 K/uL (ref 150.0–400.0)
RBC: 4.56 Mil/uL (ref 3.87–5.11)
RDW: 13.7 % (ref 11.5–15.5)
WBC: 6.8 K/uL (ref 4.0–10.5)

## 2024-04-06 LAB — COMPREHENSIVE METABOLIC PANEL WITH GFR
ALT: 12 U/L (ref 0–35)
AST: 19 U/L (ref 0–37)
Albumin: 4.6 g/dL (ref 3.5–5.2)
Alkaline Phosphatase: 68 U/L (ref 39–117)
BUN: 14 mg/dL (ref 6–23)
CO2: 29 meq/L (ref 19–32)
Calcium: 9.6 mg/dL (ref 8.4–10.5)
Chloride: 103 meq/L (ref 96–112)
Creatinine, Ser: 0.87 mg/dL (ref 0.40–1.20)
GFR: 64.88 mL/min (ref >60.00)
Glucose, Bld: 100 mg/dL — ABNORMAL HIGH (ref 70–99)
Potassium: 4.6 meq/L (ref 3.5–5.1)
Sodium: 141 meq/L (ref 135–145)
Total Bilirubin: 0.6 mg/dL (ref 0.2–1.2)
Total Protein: 7.5 g/dL (ref 6.0–8.3)

## 2024-04-06 LAB — LIPID PANEL
Cholesterol: 197 mg/dL (ref 0–200)
HDL: 59.2 mg/dL (ref >39.00)
LDL Cholesterol: 95 mg/dL (ref 0–99)
NonHDL: 137.56
Total CHOL/HDL Ratio: 3
Triglycerides: 211 mg/dL — ABNORMAL HIGH (ref 0.0–149.0)
VLDL: 42.2 mg/dL — ABNORMAL HIGH (ref 0.0–40.0)

## 2024-04-06 LAB — TSH: TSH: 0.53 u[IU]/mL (ref 0.35–5.50)

## 2024-04-06 LAB — T4, FREE: Free T4: 1.06 ng/dL (ref 0.60–1.60)

## 2024-04-06 LAB — HEMOGLOBIN A1C: Hgb A1c MFr Bld: 6.6 % — ABNORMAL HIGH (ref 4.6–6.5)

## 2024-04-07 ENCOUNTER — Ambulatory Visit: Payer: Self-pay | Admitting: Internal Medicine

## 2024-04-09 ENCOUNTER — Encounter: Payer: Self-pay | Admitting: Internal Medicine

## 2024-04-25 ENCOUNTER — Encounter: Payer: Self-pay | Admitting: Internal Medicine

## 2024-04-25 DIAGNOSIS — J069 Acute upper respiratory infection, unspecified: Secondary | ICD-10-CM | POA: Insufficient documentation

## 2024-04-25 NOTE — Assessment & Plan Note (Signed)
 On Prandin  to 1 mg with meals.  Obtain lab with hemoglobin A1c

## 2024-04-25 NOTE — Assessment & Plan Note (Signed)
 New.  Prescribed cefdinir , promethazine  cough syrup.  She will try to stop smoking

## 2024-04-25 NOTE — Assessment & Plan Note (Signed)
 On losartan , Aldactone , Amlodipine 

## 2024-04-25 NOTE — Assessment & Plan Note (Signed)
 Monitor GFR Hydrate well

## 2024-04-25 NOTE — Assessment & Plan Note (Signed)
 Continue with Zofran  as needed nausea

## 2024-05-11 ENCOUNTER — Telehealth: Payer: Self-pay

## 2024-05-11 NOTE — Telephone Encounter (Signed)
 Copied from CRM #[id number]. Topic: Clinical - Medical Advice >> May 11, 2024 11:19 AM Dedra B wrote: Reason for CRM: Pt's daughter, Janetta, said pt has a pimple on her face that she's had for a while but recently started getting bigger. She wants to know if it can be removed in office or if she would need to be referred out. Pls call her at [phone]. She said it;s okay to leave a message if she doesn't answer.

## 2024-05-12 NOTE — Telephone Encounter (Signed)
 I am not sure.  Can she send a picture via MyChart?  Thanks

## 2024-05-12 NOTE — Telephone Encounter (Signed)
 I was able to speak with the pts daughter to inform her on regards to PCP instructions of sending in a photo via MyChart of the area that is a concern.

## 2024-05-13 ENCOUNTER — Encounter: Payer: Self-pay | Admitting: Internal Medicine

## 2024-05-13 DIAGNOSIS — D485 Neoplasm of uncertain behavior of skin: Secondary | ICD-10-CM

## 2024-10-26 ENCOUNTER — Ambulatory Visit: Admitting: Neurology
